# Patient Record
Sex: Female | Born: 1950
Health system: Southern US, Community
[De-identification: ages and names within clinical notes are randomized; demographics above are authoritative.]

## PROBLEM LIST (undated history)

## (undated) DIAGNOSIS — I10 Essential (primary) hypertension: Secondary | ICD-10-CM

## (undated) DIAGNOSIS — F329 Major depressive disorder, single episode, unspecified: Secondary | ICD-10-CM

## (undated) DIAGNOSIS — H269 Unspecified cataract: Secondary | ICD-10-CM

## (undated) DIAGNOSIS — I2510 Atherosclerotic heart disease of native coronary artery without angina pectoris: Secondary | ICD-10-CM

## (undated) DIAGNOSIS — M8580 Other specified disorders of bone density and structure, unspecified site: Secondary | ICD-10-CM

## (undated) DIAGNOSIS — Z8041 Family history of malignant neoplasm of ovary: Secondary | ICD-10-CM

## (undated) DIAGNOSIS — G4730 Sleep apnea, unspecified: Secondary | ICD-10-CM

## (undated) DIAGNOSIS — F32A Depression, unspecified: Secondary | ICD-10-CM

## (undated) DIAGNOSIS — E785 Hyperlipidemia, unspecified: Secondary | ICD-10-CM

## (undated) DIAGNOSIS — G4733 Obstructive sleep apnea (adult) (pediatric): Secondary | ICD-10-CM

## (undated) HISTORY — PX: COLONOSCOPY: SHX174

## (undated) HISTORY — DX: Sleep apnea, unspecified: G47.30

## (undated) HISTORY — DX: Obstructive sleep apnea (adult) (pediatric): G47.33

## (undated) HISTORY — DX: Family history of malignant neoplasm of ovary: Z80.41

## (undated) HISTORY — DX: Hyperlipidemia, unspecified: E78.5

## (undated) HISTORY — PX: DERMOID CYST  EXCISION: SHX1452

## (undated) HISTORY — PX: CATARACT EXTRACTION: SUR2

## (undated) HISTORY — DX: Unspecified cataract: H26.9

## (undated) HISTORY — DX: Atherosclerotic heart disease of native coronary artery without angina pectoris: I25.10

## (undated) HISTORY — DX: Other specified disorders of bone density and structure, unspecified site: M85.80

## (undated) HISTORY — DX: Depression, unspecified: F32.A

## (undated) HISTORY — DX: Essential (primary) hypertension: I10

## (undated) HISTORY — DX: Major depressive disorder, single episode, unspecified: F32.9

---

## 2000-07-09 ENCOUNTER — Encounter (INDEPENDENT_AMBULATORY_CARE_PROVIDER_SITE_OTHER): Payer: Self-pay | Admitting: Specialist

## 2000-07-09 ENCOUNTER — Ambulatory Visit (HOSPITAL_COMMUNITY): Admission: RE | Admit: 2000-07-09 | Discharge: 2000-07-09 | Payer: Self-pay | Admitting: Gynecology

## 2001-06-10 ENCOUNTER — Encounter: Payer: Self-pay | Admitting: Gynecology

## 2001-06-10 ENCOUNTER — Encounter: Admission: RE | Admit: 2001-06-10 | Discharge: 2001-06-10 | Payer: Self-pay | Admitting: Gynecology

## 2001-07-12 ENCOUNTER — Other Ambulatory Visit: Admission: RE | Admit: 2001-07-12 | Discharge: 2001-07-12 | Payer: Self-pay | Admitting: Gynecology

## 2002-06-13 ENCOUNTER — Encounter: Payer: Self-pay | Admitting: Gynecology

## 2002-06-13 ENCOUNTER — Encounter: Admission: RE | Admit: 2002-06-13 | Discharge: 2002-06-13 | Payer: Self-pay | Admitting: Gynecology

## 2002-06-30 ENCOUNTER — Other Ambulatory Visit: Admission: RE | Admit: 2002-06-30 | Discharge: 2002-06-30 | Payer: Self-pay | Admitting: Gynecology

## 2002-08-10 HISTORY — PX: ELBOW FRACTURE SURGERY: SHX616

## 2003-07-20 ENCOUNTER — Other Ambulatory Visit: Admission: RE | Admit: 2003-07-20 | Discharge: 2003-07-20 | Payer: Self-pay | Admitting: Gynecology

## 2003-07-23 ENCOUNTER — Encounter: Admission: RE | Admit: 2003-07-23 | Discharge: 2003-07-23 | Payer: Self-pay | Admitting: Gynecology

## 2004-07-23 ENCOUNTER — Other Ambulatory Visit: Admission: RE | Admit: 2004-07-23 | Discharge: 2004-07-23 | Payer: Self-pay | Admitting: Gynecology

## 2004-07-23 ENCOUNTER — Encounter: Admission: RE | Admit: 2004-07-23 | Discharge: 2004-07-23 | Payer: Self-pay | Admitting: Gynecology

## 2005-07-27 ENCOUNTER — Other Ambulatory Visit: Admission: RE | Admit: 2005-07-27 | Discharge: 2005-07-27 | Payer: Self-pay | Admitting: Gynecology

## 2005-08-11 ENCOUNTER — Encounter: Admission: RE | Admit: 2005-08-11 | Discharge: 2005-08-11 | Payer: Self-pay | Admitting: Gynecology

## 2006-03-10 ENCOUNTER — Ambulatory Visit: Payer: Self-pay | Admitting: Family Medicine

## 2006-03-25 ENCOUNTER — Ambulatory Visit: Payer: Self-pay | Admitting: Family Medicine

## 2006-07-28 ENCOUNTER — Other Ambulatory Visit: Admission: RE | Admit: 2006-07-28 | Discharge: 2006-07-28 | Payer: Self-pay | Admitting: Gynecology

## 2006-08-12 ENCOUNTER — Encounter: Admission: RE | Admit: 2006-08-12 | Discharge: 2006-08-12 | Payer: Self-pay | Admitting: Gynecology

## 2006-09-13 ENCOUNTER — Ambulatory Visit: Payer: Self-pay | Admitting: Family Medicine

## 2006-09-13 LAB — CONVERTED CEMR LAB
BUN: 13 mg/dL (ref 6–23)
Basophils Absolute: 0.1 10*3/uL (ref 0.0–0.1)
Basophils Relative: 1 % (ref 0.0–1.0)
CO2: 31 meq/L (ref 19–32)
Creatinine, Ser: 0.7 mg/dL (ref 0.4–1.2)
Direct LDL: 133.8 mg/dL
Eosinophils Absolute: 0.1 10*3/uL (ref 0.0–0.6)
MCHC: 34.6 g/dL (ref 30.0–36.0)
MCV: 89.3 fL (ref 78.0–100.0)
Monocytes Relative: 8.3 % (ref 3.0–11.0)
Neutro Abs: 2.7 10*3/uL (ref 1.4–7.7)
Neutrophils Relative %: 53.2 % (ref 43.0–77.0)
Platelets: 296 10*3/uL (ref 150–400)
RBC: 4.96 M/uL (ref 3.87–5.11)
Sodium: 142 meq/L (ref 135–145)

## 2006-10-11 ENCOUNTER — Ambulatory Visit: Payer: Self-pay | Admitting: Family Medicine

## 2007-04-18 ENCOUNTER — Telehealth (INDEPENDENT_AMBULATORY_CARE_PROVIDER_SITE_OTHER): Payer: Self-pay | Admitting: *Deleted

## 2007-05-02 ENCOUNTER — Ambulatory Visit: Payer: Self-pay | Admitting: Family Medicine

## 2007-05-02 DIAGNOSIS — G4700 Insomnia, unspecified: Secondary | ICD-10-CM | POA: Insufficient documentation

## 2007-05-02 DIAGNOSIS — F329 Major depressive disorder, single episode, unspecified: Secondary | ICD-10-CM

## 2007-05-02 DIAGNOSIS — H409 Unspecified glaucoma: Secondary | ICD-10-CM | POA: Insufficient documentation

## 2007-05-02 DIAGNOSIS — F32A Depression, unspecified: Secondary | ICD-10-CM | POA: Insufficient documentation

## 2007-05-02 DIAGNOSIS — I10 Essential (primary) hypertension: Secondary | ICD-10-CM | POA: Insufficient documentation

## 2007-05-02 DIAGNOSIS — R609 Edema, unspecified: Secondary | ICD-10-CM | POA: Insufficient documentation

## 2007-05-03 ENCOUNTER — Telehealth (INDEPENDENT_AMBULATORY_CARE_PROVIDER_SITE_OTHER): Payer: Self-pay | Admitting: *Deleted

## 2007-05-03 ENCOUNTER — Encounter (INDEPENDENT_AMBULATORY_CARE_PROVIDER_SITE_OTHER): Payer: Self-pay | Admitting: Family Medicine

## 2007-05-03 LAB — CONVERTED CEMR LAB
Calcium: 9.3 mg/dL (ref 8.4–10.5)
Chloride: 108 meq/L (ref 96–112)
Creatinine, Ser: 0.9 mg/dL (ref 0.4–1.2)
GFR calc Af Amer: 84 mL/min
GFR calc non Af Amer: 69 mL/min
Potassium: 4.3 meq/L (ref 3.5–5.1)

## 2007-05-04 ENCOUNTER — Telehealth (INDEPENDENT_AMBULATORY_CARE_PROVIDER_SITE_OTHER): Payer: Self-pay | Admitting: *Deleted

## 2007-06-30 ENCOUNTER — Ambulatory Visit: Payer: Self-pay | Admitting: Family Medicine

## 2007-08-16 ENCOUNTER — Encounter: Admission: RE | Admit: 2007-08-16 | Discharge: 2007-08-16 | Payer: Self-pay | Admitting: Family Medicine

## 2007-08-26 ENCOUNTER — Telehealth (INDEPENDENT_AMBULATORY_CARE_PROVIDER_SITE_OTHER): Payer: Self-pay | Admitting: *Deleted

## 2007-09-26 ENCOUNTER — Ambulatory Visit: Payer: Self-pay | Admitting: Family Medicine

## 2007-09-27 ENCOUNTER — Encounter (INDEPENDENT_AMBULATORY_CARE_PROVIDER_SITE_OTHER): Payer: Self-pay | Admitting: *Deleted

## 2007-09-27 LAB — CONVERTED CEMR LAB
BUN: 13 mg/dL (ref 6–23)
CO2: 28 meq/L (ref 19–32)
Calcium: 9.7 mg/dL (ref 8.4–10.5)
Chloride: 105 meq/L (ref 96–112)
Creatinine, Ser: 1.1 mg/dL (ref 0.4–1.2)
Direct LDL: 158.2 mg/dL
GFR calc Af Amer: 66 mL/min
GFR calc non Af Amer: 55 mL/min
Potassium: 4.4 meq/L (ref 3.5–5.1)
Sodium: 141 meq/L (ref 135–145)

## 2007-10-13 ENCOUNTER — Ambulatory Visit: Payer: Self-pay | Admitting: Family Medicine

## 2007-10-13 ENCOUNTER — Encounter (INDEPENDENT_AMBULATORY_CARE_PROVIDER_SITE_OTHER): Payer: Self-pay | Admitting: *Deleted

## 2008-05-23 ENCOUNTER — Ambulatory Visit: Payer: Self-pay | Admitting: Family Medicine

## 2008-05-23 ENCOUNTER — Ambulatory Visit: Payer: Self-pay | Admitting: Internal Medicine

## 2008-05-23 DIAGNOSIS — E785 Hyperlipidemia, unspecified: Secondary | ICD-10-CM | POA: Insufficient documentation

## 2008-05-23 DIAGNOSIS — M25569 Pain in unspecified knee: Secondary | ICD-10-CM | POA: Insufficient documentation

## 2008-05-24 ENCOUNTER — Telehealth (INDEPENDENT_AMBULATORY_CARE_PROVIDER_SITE_OTHER): Payer: Self-pay | Admitting: *Deleted

## 2008-05-24 LAB — CONVERTED CEMR LAB
ALT: 19 units/L (ref 0–35)
AST: 20 units/L (ref 0–37)
Alkaline Phosphatase: 43 units/L (ref 39–117)
Bilirubin, Direct: 0.2 mg/dL (ref 0.0–0.3)
CO2: 27 meq/L (ref 19–32)
Calcium: 9.3 mg/dL (ref 8.4–10.5)
Cholesterol: 235 mg/dL (ref 0–200)
Eosinophils Relative: 2.4 % (ref 0.0–5.0)
Glucose, Bld: 83 mg/dL (ref 70–99)
HDL: 58.9 mg/dL (ref >39.0)
Hemoglobin: 14 g/dL (ref 12.0–15.0)
Monocytes Absolute: 0.3 10*3/uL (ref 0.1–1.0)
Monocytes Relative: 7.9 % (ref 3.0–12.0)
Neutro Abs: 2.1 10*3/uL (ref 1.4–7.7)
Platelets: 234 10*3/uL (ref 150–400)
Potassium: 4.5 meq/L (ref 3.5–5.1)
RDW: 12.1 % (ref 11.5–14.6)
Total CHOL/HDL Ratio: 4
Triglycerides: 145 mg/dL (ref 0–149)
WBC: 4 10*3/uL — ABNORMAL LOW (ref 4.5–10.5)

## 2008-06-13 ENCOUNTER — Ambulatory Visit: Payer: Self-pay | Admitting: Family Medicine

## 2008-06-13 DIAGNOSIS — E669 Obesity, unspecified: Secondary | ICD-10-CM | POA: Insufficient documentation

## 2008-06-15 ENCOUNTER — Encounter (INDEPENDENT_AMBULATORY_CARE_PROVIDER_SITE_OTHER): Payer: Self-pay | Admitting: *Deleted

## 2008-06-15 ENCOUNTER — Telehealth (INDEPENDENT_AMBULATORY_CARE_PROVIDER_SITE_OTHER): Payer: Self-pay | Admitting: *Deleted

## 2008-06-15 LAB — CONVERTED CEMR LAB
BUN: 16 mg/dL (ref 6–23)
Chloride: 106 meq/L (ref 96–112)
GFR calc Af Amer: 96 mL/min
Potassium: 4.6 meq/L (ref 3.5–5.1)

## 2008-06-18 ENCOUNTER — Telehealth (INDEPENDENT_AMBULATORY_CARE_PROVIDER_SITE_OTHER): Payer: Self-pay | Admitting: *Deleted

## 2008-08-16 ENCOUNTER — Encounter: Admission: RE | Admit: 2008-08-16 | Discharge: 2008-08-16 | Payer: Self-pay | Admitting: Family Medicine

## 2008-08-17 ENCOUNTER — Encounter (INDEPENDENT_AMBULATORY_CARE_PROVIDER_SITE_OTHER): Payer: Self-pay | Admitting: *Deleted

## 2008-09-12 ENCOUNTER — Encounter: Payer: Self-pay | Admitting: Family Medicine

## 2008-11-06 ENCOUNTER — Ambulatory Visit: Payer: Self-pay | Admitting: Family Medicine

## 2008-11-12 ENCOUNTER — Telehealth (INDEPENDENT_AMBULATORY_CARE_PROVIDER_SITE_OTHER): Payer: Self-pay | Admitting: *Deleted

## 2008-11-12 LAB — CONVERTED CEMR LAB
AST: 24 units/L (ref 0–37)
Albumin: 3.8 g/dL (ref 3.5–5.2)
Alkaline Phosphatase: 55 units/L (ref 39–117)
Bilirubin, Direct: 0 mg/dL (ref 0.0–0.3)
Calcium: 9.4 mg/dL (ref 8.4–10.5)
Cholesterol: 263 mg/dL — ABNORMAL HIGH (ref 0–200)
Creatinine, Ser: 0.9 mg/dL (ref 0.4–1.2)
Direct LDL: 170.5 mg/dL
GFR calc non Af Amer: 68.76 mL/min (ref >60)
Glucose, Bld: 105 mg/dL — ABNORMAL HIGH (ref 70–99)
Total Bilirubin: 1 mg/dL (ref 0.3–1.2)
Total CHOL/HDL Ratio: 4
Total Protein: 6.7 g/dL (ref 6.0–8.3)
VLDL: 35 mg/dL (ref 0.0–40.0)

## 2009-01-14 ENCOUNTER — Ambulatory Visit: Payer: Self-pay | Admitting: Family Medicine

## 2009-01-14 LAB — CONVERTED CEMR LAB
ALT: 29 units/L (ref 0–35)
AST: 33 units/L (ref 0–37)
Albumin: 3.7 g/dL (ref 3.5–5.2)
Bilirubin, Direct: 0.1 mg/dL (ref 0.0–0.3)

## 2009-01-22 ENCOUNTER — Telehealth: Payer: Self-pay | Admitting: Family Medicine

## 2009-02-06 ENCOUNTER — Encounter: Payer: Self-pay | Admitting: Family Medicine

## 2009-02-06 ENCOUNTER — Other Ambulatory Visit: Admission: RE | Admit: 2009-02-06 | Discharge: 2009-02-06 | Payer: Self-pay | Admitting: Family Medicine

## 2009-02-06 ENCOUNTER — Ambulatory Visit: Payer: Self-pay | Admitting: Family Medicine

## 2009-02-07 ENCOUNTER — Telehealth (INDEPENDENT_AMBULATORY_CARE_PROVIDER_SITE_OTHER): Payer: Self-pay | Admitting: *Deleted

## 2009-02-07 LAB — CONVERTED CEMR LAB
AST: 35 units/L (ref 0–37)
Bilirubin, Direct: 0.2 mg/dL (ref 0.0–0.3)
Total Bilirubin: 1.2 mg/dL (ref 0.3–1.2)
Total CHOL/HDL Ratio: 3
Total Protein: 6.7 g/dL (ref 6.0–8.3)

## 2009-02-13 ENCOUNTER — Encounter (INDEPENDENT_AMBULATORY_CARE_PROVIDER_SITE_OTHER): Payer: Self-pay | Admitting: *Deleted

## 2009-02-22 ENCOUNTER — Telehealth (INDEPENDENT_AMBULATORY_CARE_PROVIDER_SITE_OTHER): Payer: Self-pay | Admitting: *Deleted

## 2009-02-25 ENCOUNTER — Telehealth (INDEPENDENT_AMBULATORY_CARE_PROVIDER_SITE_OTHER): Payer: Self-pay | Admitting: *Deleted

## 2009-02-27 ENCOUNTER — Ambulatory Visit: Payer: Self-pay | Admitting: Family Medicine

## 2009-02-27 DIAGNOSIS — L255 Unspecified contact dermatitis due to plants, except food: Secondary | ICD-10-CM | POA: Insufficient documentation

## 2009-06-06 ENCOUNTER — Ambulatory Visit: Payer: Self-pay | Admitting: Gastroenterology

## 2009-07-12 ENCOUNTER — Ambulatory Visit: Payer: Self-pay | Admitting: Gastroenterology

## 2009-08-19 ENCOUNTER — Telehealth (INDEPENDENT_AMBULATORY_CARE_PROVIDER_SITE_OTHER): Payer: Self-pay | Admitting: *Deleted

## 2009-08-26 ENCOUNTER — Encounter: Admission: RE | Admit: 2009-08-26 | Discharge: 2009-08-26 | Payer: Self-pay | Admitting: Family Medicine

## 2009-09-16 ENCOUNTER — Ambulatory Visit: Payer: Self-pay | Admitting: Family Medicine

## 2009-09-16 ENCOUNTER — Ambulatory Visit: Payer: Self-pay | Admitting: Diagnostic Radiology

## 2009-09-16 ENCOUNTER — Emergency Department (HOSPITAL_COMMUNITY): Admission: EM | Admit: 2009-09-16 | Discharge: 2009-09-16 | Payer: Self-pay | Admitting: Emergency Medicine

## 2009-09-16 ENCOUNTER — Encounter: Payer: Self-pay | Admitting: Emergency Medicine

## 2009-09-19 ENCOUNTER — Telehealth (INDEPENDENT_AMBULATORY_CARE_PROVIDER_SITE_OTHER): Payer: Self-pay | Admitting: *Deleted

## 2009-09-19 LAB — CONVERTED CEMR LAB
Cholesterol: 231 mg/dL — ABNORMAL HIGH (ref 0–200)
Direct LDL: 142.1 mg/dL

## 2010-03-17 ENCOUNTER — Telehealth (INDEPENDENT_AMBULATORY_CARE_PROVIDER_SITE_OTHER): Payer: Self-pay | Admitting: *Deleted

## 2010-03-26 ENCOUNTER — Ambulatory Visit: Payer: Self-pay | Admitting: Family Medicine

## 2010-03-26 ENCOUNTER — Other Ambulatory Visit: Admission: RE | Admit: 2010-03-26 | Discharge: 2010-03-26 | Payer: Self-pay | Admitting: Family Medicine

## 2010-03-26 DIAGNOSIS — M79609 Pain in unspecified limb: Secondary | ICD-10-CM | POA: Insufficient documentation

## 2010-03-27 LAB — CONVERTED CEMR LAB
ALT: 26 units/L (ref 0–35)
AST: 28 units/L (ref 0–37)
Albumin: 4.1 g/dL (ref 3.5–5.2)
Basophils Absolute: 0 10*3/uL (ref 0.0–0.1)
CO2: 28 meq/L (ref 19–32)
Direct LDL: 120.8 mg/dL
Eosinophils Relative: 1.8 % (ref 0.0–5.0)
GFR calc non Af Amer: 92.98 mL/min (ref >60)
HDL: 69.8 mg/dL (ref >39.00)
Hemoglobin: 13 g/dL (ref 12.0–15.0)
Lymphocytes Relative: 33.5 % (ref 12.0–46.0)
MCHC: 33.4 g/dL (ref 30.0–36.0)
Monocytes Absolute: 0.4 10*3/uL (ref 0.1–1.0)
Neutrophils Relative %: 55.6 % (ref 43.0–77.0)
Platelets: 255 10*3/uL (ref 150.0–400.0)
Potassium: 4.4 meq/L (ref 3.5–5.1)
Sodium: 142 meq/L (ref 135–145)
Total Bilirubin: 0.6 mg/dL (ref 0.3–1.2)
Total CHOL/HDL Ratio: 3
Total Protein: 6.7 g/dL (ref 6.0–8.3)
Triglycerides: 185 mg/dL — ABNORMAL HIGH (ref 0.0–149.0)
VLDL: 37 mg/dL (ref 0.0–40.0)
WBC: 4.4 10*3/uL — ABNORMAL LOW (ref 4.5–10.5)

## 2010-03-31 ENCOUNTER — Encounter (INDEPENDENT_AMBULATORY_CARE_PROVIDER_SITE_OTHER): Payer: Self-pay | Admitting: *Deleted

## 2010-03-31 LAB — CONVERTED CEMR LAB: Pap Smear: NEGATIVE

## 2010-05-15 ENCOUNTER — Telehealth: Payer: Self-pay | Admitting: Family Medicine

## 2010-05-15 ENCOUNTER — Ambulatory Visit: Payer: Self-pay | Admitting: Vascular Surgery

## 2010-05-16 ENCOUNTER — Telehealth: Payer: Self-pay | Admitting: Family Medicine

## 2010-08-12 ENCOUNTER — Telehealth: Payer: Self-pay | Admitting: Family Medicine

## 2010-09-08 ENCOUNTER — Encounter
Admission: RE | Admit: 2010-09-08 | Discharge: 2010-09-08 | Payer: Self-pay | Source: Home / Self Care | Attending: Family Medicine | Admitting: Family Medicine

## 2010-09-08 LAB — HM MAMMOGRAPHY

## 2010-09-09 NOTE — Progress Notes (Signed)
Summary: Furosemide refill  Phone Note Refill Request Message from:  Patient  Refills Requested: Medication #1:  FUROSEMIDE 20 MG TABS Take 1 tab by mouth as needed for swelling MD cannot see patient today so prescription will be sent to pharmacy for pick up.  Initial call taken by: Lucious Groves CMA,  May 16, 2010 8:29 AM    Prescriptions: FUROSEMIDE 20 MG TABS (FUROSEMIDE) Take 1 tab by mouth as needed for swelling  #30 Tablet x 3   Entered by:   Lucious Groves CMA   Authorized by:   Neena Rhymes MD   Signed by:   Lucious Groves CMA on 05/16/2010   Method used:   Electronically to        [facility]  [address] [address] [phone][facility] (retail)       [address]       [place], [place]  [address]       Ph: [phone] or [phone]       Fax: [phone]   RxID:   [id number]

## 2010-09-09 NOTE — Progress Notes (Signed)
Summary: simvastatin refill   Phone Note Refill Request Message from:  Fax from Pharmacy on March 17, 2010 8:58 AM  Refills Requested: Medication #1:  SIMVASTATIN 20 MG TABS take one tablet at bedtime [facility] - fax [phone] - - note from pharmacy "request for auth to dispense 90 day supply"  Initial call taken by: Okey Regal [place],  March 17, 2010 9:01 AM    Prescriptions: SIMVASTATIN 20 MG TABS (SIMVASTATIN) take one tablet at bedtime  #30 Tablet x 0   Entered by:   Doristine Devoid CMA   Authorized by:   Neena Rhymes MD   Signed by:   Doristine Devoid CMA on 03/17/2010   Method used:   [facility]  [address] [phone][facility] ([facility]ail[facility]       [address]       [place], [place]  [address]       Ph: [phone] or [phone]       Fax: [phone]   RxID:   [id number]

## 2010-09-09 NOTE — Letter (Signed)
Summary: Results Follow up Letter  [facility]  [address] [place], [place] [address]   Phone: [phone]  Fax: [phone]    03/31/2010 MRN: [id number]  Alexandra [name] [address] [place], [place]  [address]  Dear [name],  The following are the results of your recent test(s):  Test         Result    Pap Smear:        Normal __X___  Not Normal _____ Comments: REPEAT 1 YR. ______________________________________________________ Cholesterol: LDL(Bad cholesterol):         Your goal is less than:         HDL (Good cholesterol):       Your goal is more than: Comments:  ______________________________________________________ Mammogram:        Normal _____  Not Normal _____ Comments:  ___________________________________________________________________ Hemoccult:        Normal _____  Not normal _______ Comments:    _____________________________________________________________________ Other Tests:    We routinely do not discuss normal results over the telephone.  If you desire a copy of the results, or you have any questions about this information we can discuss them at your next office visit.   Sincerely,

## 2010-09-09 NOTE — Progress Notes (Signed)
Summary: refill  Phone Note Refill Request Message from:  Fax from Pharmacy on [address] [address] fax [phone]  Refills Requested: Medication #1:  LEXAPRO 20 MG TABS Initial call taken by: Barb Merino,  August 19, 2009 4:37 PM    Prescriptions: LEXAPRO 20 MG TABS (ESCITALOPRAM OXALATE)   #90 x 0   Entered by:   Shary Decamp   Authorized by:   Neena Rhymes MD   Signed by:   Shary Decamp on 08/19/2009   Method used:   [facility]ed to ...       [facility]  [address] [phone][facility] (retail[facility]       [address]       [place], [place]  [address]       Ph: [phone] or [phone]       Fax: [phone]   RxID:   [id number]

## 2010-09-09 NOTE — Assessment & Plan Note (Signed)
Summary: cpx/fasting//kn   Vital Signs:  Patient profile:   60 year old female Height:      63.25 inches Weight:      188 pounds BMI:     33.16 Pulse rate:   80 / minute BP sitting:   130 / 80  (left arm)  Vitals Entered By: Doristine Devoid CMA (March 26, 2010 10:16 AM) CC: CPX AND LABS W/ PAP   History of Present Illness: 60 yo woman here today for CPE.  UTD on mammogram.  Depression- weaned off lexapro, doing well w/out meds.  edema- 'not happy with the medication', 'i'm so tired of swelling'.  still has swelling after a night of elevation.  having swelling daily.    L thumb pain- pt reports she fell and injured thumb 'years ago' but was told that it wasn't broken.  L thumb MCP joint swollen, unable to extend thumb.  will still have pain but is more bothered by limited movement.  Preventive Screening-Counseling & Management  Alcohol-Tobacco     Alcohol drinks/day: <1     Smoking Status: never  Caffeine-Diet-Exercise     Does Patient Exercise: yes     Type of exercise: walking      Sexual History:  currently monogamous.        Drug Use:  never.    Current Medications (verified): 1)  Lexapro 20 Mg Tabs (Escitalopram Oxalate) .... Take One Tablet Daily 2)  Lumigan 0.03 % Soln (Bimatoprost) .... Use Daily 3)  Lisinopril-Hydrochlorothiazide 10-12.5 Mg Tabs (Lisinopril-Hydrochlorothiazide) .Marland Kitchen.. 1 Tablet By Mouth Once A Day 4)  Ambien Cr 12.5 Mg  Tbcr (Zolpidem Tartrate) .... Take One Tablet Bedtime 5)  Furosemide 20 Mg Tabs (Furosemide) .... Take 1 Tab By Mouth As Needed For Swelling 6)  Voltaren 75 Mg Tbec (Diclofenac Sodium) .Marland Kitchen.. 1 Tab By Mouth Two Times A Day X 14 Days and Then As Needed For Pain 7)  Simvastatin 20 Mg Tabs (Simvastatin) .... Take One Tablet At Bedtime  Allergies (verified): No Known Drug Allergies  Past History:  Past Medical History: Last updated: 03-03-09 Depression Hypertension Hyperlipidemia Glaucoma  Past Surgical History: Last  updated: 05/02/2007 Dermoid Cyst  Family History: Last updated: Mar 03, 2009 Father MI: deceased at age 27 no family hx of breast CA sister died of lung cancer- age 61 brother in remission from throat cancer no colon or breast CA  Social History: Last updated: 05/02/2007 Occupation: Surveyor, mining Married with children Never Smoked Alcohol use-yes  Review of Systems       The patient complains of peripheral edema.  The patient denies anorexia, fever, weight loss, weight gain, vision loss, decreased hearing, hoarseness, chest pain, syncope, dyspnea on exertion, prolonged cough, headaches, abdominal pain, melena, hematochezia, severe indigestion/heartburn, hematuria, suspicious skin lesions, depression, abnormal bleeding, enlarged lymph nodes, and breast masses.    Physical Exam  General:  Well-developed,well-nourished,in no acute distress; alert,appropriate and cooperative throughout examination Head:  Normocephalic and atraumatic without obvious abnormalities. No apparent alopecia or balding. Eyes:  No corneal or conjunctival inflammation noted. EOMI. Perrla. Funduscopic exam benign, without hemorrhages, exudates or papilledema. Vision grossly normal. Ears:  External ear exam shows no significant lesions or deformities.  Otoscopic examination reveals clear canals, tympanic membranes are intact bilaterally without bulging, retraction, inflammation or discharge. Hearing is grossly normal bilaterally. Nose:  External nasal examination shows no deformity or inflammation. Nasal mucosa are pink and moist without lesions or exudates. Mouth:  Oral mucosa and oropharynx without lesions or exudates.  Teeth  in good repair. Neck:  No deformities, masses, or tenderness noted. Breasts:  No mass, nodules, thickening, tenderness, bulging, retraction, inflamation, nipple discharge or skin changes noted.   Lungs:  Normal respiratory effort, chest expands symmetrically. Lungs are clear to auscultation,  no crackles or wheezes. Heart:  Normal rate and regular rhythm. S1 and S2 normal without gallop, murmur, click, rub or other extra sounds. Abdomen:  soft, NT/ND, +BS Genitalia:  Pelvic Exam:        External: normal female genitalia without lesions or masses        Vagina: normal without lesions or masses        Cervix: normal without lesions or masses        Adnexa: normal bimanual exam without masses or fullness        Uterus: normal by palpation        Pap smear: performed Msk:  deformity of L MCP joint, inability to extend thumb remainder of exam normal Pulses:  +2 carotid, radial, DP pulses Extremities:  +1 edema of LEs bilaterally Neurologic:  No cranial nerve deficits noted. Station and gait are normal. Plantar reflexes are down-going bilaterally. DTRs are symmetrical throughout. Sensory, motor and coordinative functions appear intact. Skin:  Intact without suspicious lesions or rashes Cervical Nodes:  No lymphadenopathy noted Axillary Nodes:  No palpable lymphadenopathy Psych:  Cognition and judgment appear intact. Alert and cooperative with normal attention span and concentration. No apparent delusions, illusions, hallucinations   Impression & Recommendations:  Problem # 1:  ROUTINE GYNECOLOGICAL EXAMINATION (ICD-V72.31) Assessment New pt's PE WNL w/ exception of L thumb and LE edema (see below).  UTD on mammogram.  labs collected, anticipatory guidance provided.  Problem # 2:  SCREENING FOR MALIGNANT NEOPLASM OF THE CERVIX (ICD-V76.2) Assessment: New pap collected.  Problem # 3:  ANKLE EDEMA, CHRONIC (ICD-782.3) Assessment: Unchanged  pt unhappy w/ results of lasix.  not exercising regularly.  reviewed supportive care- exercise, low Na diet, support hose.  will refer to vascular for complete eval- pt in agreement Her updated medication list for this problem includes:    Lisinopril-hydrochlorothiazide 10-12.5 Mg Tabs (Lisinopril-hydrochlorothiazide) .Marland Kitchen... 1 tablet by mouth  once a day    Furosemide 20 Mg Tabs (Furosemide) .Marland Kitchen... Take 1 tab by mouth as needed for swelling  Orders: [facility] Clinic (Vascular)  Problem # 4:  THUMB PAIN, LEFT (ICD-729.5) Assessment: New  pt's L thumb swollen w/ deformity of MCP joint.  will refer to hand specialist for evaluation and tx.  Orders: Orthopedic Referral (Ortho)  Problem # 5:  HYPERLIPIDEMIA (ICD-272.4) Assessment: Unchanged due for labs. Her updated medication list for this problem includes:    Simvastatin 20 Mg Tabs (Simvastatin) .Marland Kitchen... Take one tablet at bedtime  Orders: Venipuncture ([address]) TLB-Lipid Panel (80061-LIPID) TLB-Hepatic/Liver Function Pnl (80076-HEPATIC)  Problem # 6:  HYPERTENSION (ICD-401.9) Assessment: Unchanged adequate control.  check labs. Her updated medication list for this problem includes:    Lisinopril-hydrochlorothiazide 10-12.5 Mg Tabs (Lisinopril-hydrochlorothiazide) .Marland Kitchen... 1 tablet by mouth once a day    Furosemide 20 Mg Tabs (Furosemide) .Marland Kitchen... Take 1 tab by mouth as needed for swelling  Orders: TLB-BMP (Basic Metabolic Panel-BMET) (80048-METABOL) TLB-CBC Platelet - w/Differential (85025-CBCD) TLB-TSH (Thyroid Stimulating Hormone) (84443-TSH) Specimen Handling ([address])  Complete Medication List: 1)  Lexapro 20 Mg Tabs (Escitalopram oxalate) .... Take one tablet daily 2)  Lumigan 0.03 % Soln (Bimatoprost) .... Use daily 3)  Lisinopril-hydrochlorothiazide 10-12.5 Mg Tabs (Lisinopril-hydrochlorothiazide) .Marland Kitchen.. 1 tablet by mouth once a day 4)  Ambien  Cr 12.5 Mg Tbcr (Zolpidem tartrate) .... Take one tablet bedtime 5)  Furosemide 20 Mg Tabs (Furosemide) .... Take 1 tab by mouth as needed for swelling 6)  Voltaren 75 Mg Tbec (Diclofenac sodium) .Marland Kitchen.. 1 tab by mouth two times a day x 14 days and then as needed for pain 7)  Simvastatin 20 Mg Tabs (Simvastatin) .... Take one tablet at bedtime  Patient Instructions: 1)  Follow up in 6 months- sooner if needed 2)  Someone will call  you with your hand and vascular appts 3)  We'll notify you of your lab results 4)  Try and get regular exercise- this will help the swelling 5)  Call with any questions or concerns 6)  Have a great fall season! Prescriptions: AMBIEN CR 12.5 MG  TBCR (ZOLPIDEM TARTRATE) take one tablet bedtime  #30 x 1   Entered and Authorized by:   Neena Rhymes MD   Signed by:   Neena Rhymes MD on 03/26/2010   Method used:   Print then Give to Patient   RxID:   [id number] SIMVASTATIN 20 MG TABS (SIMVASTATIN) take one tablet at bedtime  #90 x 3   Entered and Authorized by:   Neena Rhymes MD   Signed by:   Neena Rhymes MD on 03/26/2010   Method used:   Electronically to        [facility]* (mail-order)       [address] [place], [place]  [address]       Ph: [phone]       Fax: [phone]   RxID:   [id number] LISINOPRIL-HYDROCHLOROTHIAZIDE 10-12.5 MG TABS (LISINOPRIL-HYDROCHLOROTHIAZIDE) 1 tablet by mouth once a day  #90 x 3   Entered and Authorized by:   Neena Rhymes MD   Signed by:   Neena Rhymes MD on 03/26/2010   Method used:   Electronically to        [facility]* (mail-order)       [address] [place], [place]  [address]       Ph: [phone]       Fax: [phone]   RxID:   [id number]

## 2010-09-09 NOTE — Progress Notes (Signed)
Summary: specialist/rx refill  Phone Note Call from Patient Call back at [phone]   Summary of Call: Patient left message on triage that she saw the vein specialist today and they cannot find a reason for her swelling in her legs. She notes that not only did they not know the cause, but they also didn't give her anything to help her get some relief from the swelling. Patient would like to know what Dr. Beverely Low recommends and will contact the pharmacy about a refill. Patient was last seen 03/2010. Is it ok to refilll the Furosemide? Please advise.  Initial call taken by: Lucious Groves CMA,  May 15, 2010 3:56 PM  Follow-up for Phone Call        Patient called back to note that she is very unhappy with the specialist today. Her legs are swollen badly and the specialist tried to convince her that there was nothing wrong/is not uncommon. Pt notes that it occurs daily. Patient is on the verge of crying , she is requesting a call from Dr. Beverely Low. Lucious Groves CMA  May 15, 2010 4:04 PM   Additional Follow-up for Phone Call Additional follow up Details #1::        unable to call pt's right now b/c i'm seeing pt's.  ok to refill lasix- 1 tab daily as needed for swelling, take 2nd tab prn for severe or continued swelling.  #30, 3 refills. Additional Follow-up by: Neena Rhymes MD,  May 15, 2010 4:10 PM    Additional Follow-up for Phone Call Additional follow up Details #2::    Patient notified and she burst into tears. She wants to discuss and will come and see Tabori tomorrow. Lucious Groves CMA  May 15, 2010 4:47 PM

## 2010-09-09 NOTE — Progress Notes (Signed)
Summary: discuss labs  Phone Note Outgoing Call   Call placed by: Doristine Devoid,  September 19, 2009 11:36 AM Call placed to: Patient Summary of Call: Pt's cholesterol is not nearly as good as it was 6 months ago.  total cholesterol has increased from 154 --> 231.  LDL has increased from 70 --> 142.  Please ask if she is still taking simvastatin.  If so, needs to increase dose to 40 mg nightly.  if not taking, should resume daily and recheck LFTs in 6 weeks.  Follow-up for Phone Call        left message on machine .Marland KitchenDoristine Devoid  September 19, 2009 11:37 AM   spoke w/ patient says she hadn't been taken cholesterol meds but says she will restart and return for lft's in 6 weeks .Marland KitchenMarland KitchenDoristine Devoid  September 23, 2009 3:43 PM

## 2010-09-10 ENCOUNTER — Other Ambulatory Visit: Payer: Self-pay | Admitting: Family Medicine

## 2010-09-10 DIAGNOSIS — R928 Other abnormal and inconclusive findings on diagnostic imaging of breast: Secondary | ICD-10-CM

## 2010-09-11 NOTE — Progress Notes (Signed)
Summary:  call-a-nurse  Phone Note Outgoing Call   Call placed by: Jeremy Johann CMA,  August 12, 2010 8:40 AM Details for Reason: [facility] Triage Call Report Triage Record Num: [id number] Operator: Valene Bors Patient Name: Alexandra [name] Call Date & Time: 08/11/2010 8:53:13AM Patient Phone: [phone] PCP: Neena Rhymes ([facility]) Patient Gender: Female PCP Fax : Patient DOB: 08-15-1950 Practice Name: [facility] Reason for Call: [name] is calling about starting with a Sore Throat and congestion on 08/04/10. She has a frequent loose cough and sinus congestion and is coughing up greenish yellowish mucos. She is afebrile. Occasional headaches. She is taking a OTC Chest Congestion DM and she has been taking Acetaminophen q 4 hr. Advised plain Robitussin or Musinex for daytime. Care advice per URI Protocol and advised to be checked within 24 hrs. She may go to UC today -08/11/10. Advised to call office on 08/12/10 if UC not open. Protocol(s) Used: Upper Respiratory Infection (URI) Recommended Outcome per Protocol: See Provider within 24 hours Reason for Outcome: Productive cough with colored sputum (other than clear or white sputum) Care Advice:  ~ Use a cool mist humidifier to moisten air. Be sure to clean according to manufacturer's instructions.  ~ May inhale steam from hot shower or heated water. Be careful to avoid burns. Limit or avoid exposure to irritants and allergens (e.g. air pollution, smoke/smoking, chemicals, dust, pollen, pet dander, etc.)  ~ Increase fluids to 8-12 eight oz (1.6 to 2.4 liters) glasses per day, half of them to be water. Soups, popsicles, fruit juices, non-caffeinated sodas (unless restricting sodium intake), jello, broths, decaf teas, etc. are all okay. Warm fluids can be soothing.  ~  ~ If you can, stop smoking now and avoid all secondhand smoke.  ~ Warm fluids may help, or try a mixture of honey and lemon juice in warm tea.  ~  HEALTH PROMOTION / MAINTENANCE  ~ SYMPTOM / CONDITION MANAGEMENT  ~ CAUTIONS Coughing up mucus or phlegm helps to get rid of an infection. A productive cough should not be stopped. A cough medicine wit Summary of Call: Left message to call office........................Marland KitchenFelecia Deloach CMA  August 12, 2010 8:40 AM   left message to call office.....................Marland KitchenFelecia Deloach CMA  August 13, 2010 8:28 AM   Pt states that she did go to UC and was Rx a Zpak. Pt feels much better today however she drop her last pill of pak so she is unable to take last dose of Rx. Pt advise to inform UC to see what they would like her to do ........Marland KitchenFelecia Deloach CMA  August 15, 2010 9:34 AM   Follow-up for Phone Call        missing the last pill won't cause her problems.  no need to pay for additional meds. Follow-up by: Neena Rhymes MD,  August 15, 2010 10:03 AM  Additional Follow-up for Phone Call Additional follow up Details #1::        Discuss with patient ...........Marland KitchenFelecia Deloach CMA  August 15, 2010 11:57 AM

## 2010-09-12 ENCOUNTER — Ambulatory Visit
Admission: RE | Admit: 2010-09-12 | Discharge: 2010-09-12 | Disposition: A | Discharge status: Home / Self Care | Payer: BC Managed Care – PPO | Source: Ambulatory Visit | Attending: Family Medicine | Admitting: Family Medicine

## 2010-09-12 ENCOUNTER — Encounter: Payer: Self-pay | Admitting: Family Medicine

## 2010-09-12 DIAGNOSIS — R928 Other abnormal and inconclusive findings on diagnostic imaging of breast: Secondary | ICD-10-CM

## 2010-09-25 NOTE — Letter (Signed)
Summary: [facility]  [facility]   Imported By: Kassie Mends 09/18/2010 10:44:48  _____________________________________________________________________  External Attachment:    Type:   Image     Comment:   External Document

## 2010-10-30 LAB — CBC
HCT: 39.5 % (ref 36.0–46.0)
MCHC: 34 g/dL (ref 30.0–36.0)
Platelets: 315 10*3/uL (ref 150–400)
RDW: 12.6 % (ref 11.5–15.5)
WBC: 6.7 10*3/uL (ref 4.0–10.5)

## 2010-10-30 LAB — BASIC METABOLIC PANEL
BUN: 22 mg/dL (ref 6–23)
CO2: 29 mEq/L (ref 19–32)
Calcium: 8.9 mg/dL (ref 8.4–10.5)
Creatinine, Ser: 0.9 mg/dL (ref 0.4–1.2)
Sodium: 142 mEq/L (ref 135–145)

## 2010-10-30 LAB — DIFFERENTIAL
Eosinophils Relative: 2 % (ref 0–5)
Monocytes Relative: 7 % (ref 3–12)
Neutrophils Relative %: 53 % (ref 43–77)

## 2010-12-23 NOTE — Assessment & Plan Note (Signed)
OFFICE VISIT   [name], [name]  DOB:  [birth year]                                       05/15/2010  CHART#:[id number]   CHIEF COMPLAINT:  Leg swelling.   HISTORY OF PRESENT ILLNESS:  The patient is a 60 year old female  referred by Dr. Beverely Low for bilateral lower extremity leg swelling.  The  patient states she has had swelling of her calves, ankles and feet for  greater than 10 years.  The left leg is slightly worse than the right.  She states the swelling does become worse if she drinks a few alcoholic  beverages.  She states that the swelling also becomes worse if she is  standing on her feet all day long.  The swelling does improve somewhat  with elevation.  She usually sits most of the time at work.  She denies  any trauma her lower extremities.  She has had 2 prior childbirths,  spontaneous vaginal deliveries, and an ORIF of her left elbow but  otherwise no lower extremity procedures performed.   CHRONIC MEDICAL PROBLEMS:  Hypertension, elevated cholesterol.  These  are both currently controlled and followed by her primary care  physician.   PAST MEDICAL HISTORY:  As listed above.   SOCIAL HISTORY:  She is married.  She drinks 4-5 glasses of wine per  week.  She does not smoke.   FAMILY HISTORY:  Unremarkable.   A full 12-point review of systems was performed with the patient today.  Please see intake referral form for details regarding this.   PHYSICAL EXAMINATION:  Blood pressure is 131/81 in the left arm, heart  rate 72 and regular.  Temperature is 97.9.  HEENT:  Unremarkable.  Neck  has 2+ carotid pulses without bruit.  Chest is clear to auscultation.  Cardiac exam is regular rate rhythm without murmur.  Abdomen is slightly  obese, soft, nontender, nondistended.  No masses.  Extremities:  She has  2+ femoral, dorsalis pedis and posterior tibial pulses bilaterally.  Musculoskeletal exam shows no major significant joint deformities.  Neurologic  exam:  She has symmetric upper extremity and lower extremity  motor strength that is 5/5.  She has no areas of decreased sensation.  Skin has no open ulcers or rashes.   She had a venous duplex exam performed today which showed no evidence of  DVT.  She did have mild incompetence of her left common femoral vein but  she had no superficial venous incompetence.  Right lower extremity was  completely normal.   I reviewed 7 pages of medical records referred from [facility] and  this showed a normal serum albumin, normal BUN and creatinine.  Slightly  elevated total cholesterol.   I had a lengthy discussion with the patient today that there is not a  vascular origin for her leg swelling.  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  A weight  loss program would probably benefit her leg swelling significantly.  She  may also have some component of lymphedema but I am less doubtful of  this.  I did prescribe for her today bilateral lower extremity  compression stockings for symptomatic relief.  All these treatment plans  and possible etiologies of her leg swelling were discussed with the  patient today.  She will return  for followup on an as-needed basis.     Alexandra Hora. Fields, MD  Electronically Signed   CEF/MEDQ  D:  05/15/2010  T:  05/16/2010  Job:  [id number]   cc:   Alexandra [name], M.D.

## 2010-12-23 NOTE — Procedures (Signed)
DUPLEX DEEP VENOUS EXAM - LOWER EXTREMITY   INDICATION:  Chronic edema.   HISTORY:  Edema:  Bilateral edema for years per the patient.  Trauma/Surgery:  No.  Pain:  No.  PE:  No.  Previous DVT:  No.  Anticoagulants:  Other:   DUPLEX EXAM:                CFV   SFV   PopV  PTV    GSV                R  L  R  L  R  L  R   L  R  L  Thrombosis    o  o  o  o  o  o  o   o  o  o  Spontaneous   +  +  +  +  +  +  +   +  +  +  Phasic        +  +  +  +  +  +  +   +  +  +  Augmentation  +  +  +  +  +  +  +   +  +  +  Compressible  +  +  +  +  +  +  +   +  +  +  Competent     +  o  +  +  +  +  +   +  +  +   Legend:  + - yes  o - no  p - partial  D - decreased   IMPRESSION:  1. No evidence of deep or superficial vein thrombosis noted in the      bilateral lower extremities.  2. Mild venous reflux is noted in the left common femoral vein.    _____________________________  Janetta Hora. Fields, MD   CH/MEDQ  D:  05/16/2010  T:  05/16/2010  Job:  [id number]

## 2010-12-26 NOTE — Op Note (Signed)
[facility]  Patient:    Alexandra [name], Alexandra [name]                         MRN: [id number] Proc. Date: 07/09/00 Adm. Date:  [id number] Disc. Date: [id number] Attending:  Douglass Rivers                           Operative Report  PREOPERATIVE DIAGNOSIS:       Complex adnexal mass.  POSTOPERATIVE DIAGNOSES:      1. Complex adnexal mass.                               2. Endometriosis.                               3. Pelvic adhesions.  PROCEDURES:                   Laparoscopic ovarian cystectomy x 2. Lysis of adhesions.  SURGEON:                      Douglass Rivers, M.D.  ASSISTANTAneta Mins ______.  ANESTHESIA:                   General.  BLOOD LOSS:                   Minimal.  FINDINGS:                     Endometriosis in the right adnexa and cul-de-sac, right endometrioma, right dermoid, left bowel adhesions to the left adnexa, status post salpingectomy.  COMPLICATIONS:                None.  PATHOLOGY:                    There was a dermoid cyst in the cyst wall from the endometrioma.  PROCEDURE IN DETAIL:          The patient was taken to the the operating room. General anesthesia was induced and patient was placed in the dorsal lithotomy position and prepped and draped in the usual sterile fashion. A bivalve speculum was placed in the vagina, the cervix was visualized and the uterine manipulator was placed. Attention was then turned to the abdomen. An infraumbilical incision was made with the scalpel through which the Verres needle was inserted. Opening pressure was -4. The pneumoperitoneum was created until tympany was appreciated above the liver, the Veress needle was removed, and a #10/11 trocar was then inserted through the port. Confirmation in the abdomen ensued. The pelvis was inspected and there was a moderate amount of adhesions to the left adnexa obstructing view of the left ovary and subsequently found a left  salpingectomy. There was endometriosis in the cul-de-sac on the right. The right ovary initially was immobile but eventually was able to be elevated. Two #5 ports were placed under direct visualization, one in the suprapubic area and one in the right lower quadrant, both noted to be free of underlying vessels and bladder. Through these two ports the right tubo-ovarian ligament was grasped and the tube was elevated from the  sidewall. The ovarian tissue was then incised sharply with the scissor; what appeared to be a dermoid was visualized underneath. The ovarian tissue was stabilized with blunt retractors and suction irrigator was used in order to create a better plane. The incision was then extended and the dermoid cyst was excised in its entirety without rupture. A 5-mm camera was placed through the suprapubic port and the dermoid was removed through the infraumbilical port in the bag and, again, intact and was passed off the table. The ovary was irrigated at which point we noticed that there was a small amount of what appeared to be endometrioma fluid. Closer inspection revealed indeed that there was an endometrioma below the dermoid. The area was incised with the scissor. The pelvis was irrigated until the endometrioma fluid was completely removed and the cyst walls were able to be better visualized. The cyst wall was eventually sharply dissected off as the ovary was held between blunt pickups. The ovary was copiously irrigated. There was a small amount of bleeding that was noted that was treated with the Klepinger. It was noted to be hemostatic thereafter. Irrigation confirmed the ovary was hemostatic. The pelvis was irrigated with copious amount of fluid in order to remove any endometrioma-like debris that might have spilled. The upper abdomen was inspected and noted to be hemostatic. The instruments were removed under direct visualization. The infraumbilical port was closed with a  figure-of-eight of 0 Vicryl. The skin was closed with 3-0 plain. Each port was injected with 0.25% Marcaine and Steri-Strips were placed.  The patient tolerated the procedure well. Sponge, lap, and needle counts correct x 2 and she was transferred to the PACU in stable condition. DD:  07/09/00 TD:  07/09/00 Job: [id number]

## 2011-01-20 ENCOUNTER — Other Ambulatory Visit: Payer: Self-pay | Admitting: Family Medicine

## 2011-01-20 NOTE — Telephone Encounter (Signed)
Pt is due for an appt, refilled med and sent note.

## 2011-03-31 ENCOUNTER — Ambulatory Visit (INDEPENDENT_AMBULATORY_CARE_PROVIDER_SITE_OTHER): Payer: BC Managed Care – PPO | Admitting: Family Medicine

## 2011-03-31 ENCOUNTER — Encounter: Payer: Self-pay | Admitting: Family Medicine

## 2011-03-31 ENCOUNTER — Other Ambulatory Visit: Payer: Self-pay | Admitting: Family Medicine

## 2011-03-31 ENCOUNTER — Other Ambulatory Visit (HOSPITAL_COMMUNITY)
Admission: RE | Admit: 2011-03-31 | Discharge: 2011-03-31 | Disposition: A | Discharge status: Home / Self Care | Payer: BC Managed Care – PPO | Source: Ambulatory Visit | Attending: Family Medicine | Admitting: Family Medicine

## 2011-03-31 DIAGNOSIS — M25569 Pain in unspecified knee: Secondary | ICD-10-CM

## 2011-03-31 DIAGNOSIS — I10 Essential (primary) hypertension: Secondary | ICD-10-CM

## 2011-03-31 DIAGNOSIS — R609 Edema, unspecified: Secondary | ICD-10-CM

## 2011-03-31 DIAGNOSIS — E785 Hyperlipidemia, unspecified: Secondary | ICD-10-CM

## 2011-03-31 DIAGNOSIS — Z01419 Encounter for gynecological examination (general) (routine) without abnormal findings: Secondary | ICD-10-CM

## 2011-03-31 DIAGNOSIS — Z0000 Encounter for general adult medical examination without abnormal findings: Secondary | ICD-10-CM

## 2011-03-31 DIAGNOSIS — Z1231 Encounter for screening mammogram for malignant neoplasm of breast: Secondary | ICD-10-CM

## 2011-03-31 DIAGNOSIS — Z124 Encounter for screening for malignant neoplasm of cervix: Secondary | ICD-10-CM | POA: Insufficient documentation

## 2011-03-31 LAB — BASIC METABOLIC PANEL
Calcium: 9.7 mg/dL (ref 8.4–10.5)
GFR: 70.9 mL/min (ref >60.00)
Glucose, Bld: 103 mg/dL — ABNORMAL HIGH (ref 70–99)
Sodium: 140 mEq/L (ref 135–145)

## 2011-03-31 LAB — LIPID PANEL
HDL: 67.5 mg/dL (ref >39.00)
Total CHOL/HDL Ratio: 3
Triglycerides: 173 mg/dL — ABNORMAL HIGH (ref 0.0–149.0)

## 2011-03-31 LAB — HEPATIC FUNCTION PANEL
Albumin: 3.8 g/dL (ref 3.5–5.2)
Alkaline Phosphatase: 54 U/L (ref 39–117)
Bilirubin, Direct: 0 mg/dL (ref 0.0–0.3)

## 2011-03-31 LAB — CBC WITH DIFFERENTIAL/PLATELET
Basophils Absolute: 0 10*3/uL (ref 0.0–0.1)
HCT: 38.5 % (ref 36.0–46.0)
Lymphs Abs: 1.3 10*3/uL (ref 0.7–4.0)
MCHC: 33.4 g/dL (ref 30.0–36.0)
MCV: 86 fl (ref 78.0–100.0)
Monocytes Absolute: 0.4 10*3/uL (ref 0.1–1.0)
Platelets: 285 10*3/uL (ref 150.0–400.0)
RDW: 13.9 % (ref 11.5–14.6)

## 2011-03-31 LAB — LDL CHOLESTEROL, DIRECT: Direct LDL: 115.6 mg/dL

## 2011-03-31 MED ORDER — SIMVASTATIN 20 MG PO TABS: 20.0000 mg | ORAL_TABLET | Freq: Every day | ORAL | Status: DC

## 2011-03-31 MED ORDER — FUROSEMIDE 40 MG PO TABS: ORAL_TABLET | ORAL | Status: DC

## 2011-03-31 MED ORDER — ESCITALOPRAM OXALATE 20 MG PO TABS: 20.0000 mg | ORAL_TABLET | Freq: Every day | ORAL | Status: DC

## 2011-03-31 MED ORDER — LISINOPRIL-HYDROCHLOROTHIAZIDE 10-12.5 MG PO TABS: 1.0000 | ORAL_TABLET | Freq: Every day | ORAL | Status: DC

## 2011-03-31 NOTE — Patient Instructions (Signed)
Follow up in 1 month to recheck BP Take the Lisinopril daily for blood pressure Take the Lasix (now 40mg ) daily for swelling Continue the Naprosyn as needed for knee pain or swelling Work on hamstring stretches We'll notify you of your lab results Keep up the good work on your exercise- i'm impressed! Call with any questions or concerns [facility]!

## 2011-03-31 NOTE — Progress Notes (Signed)
  Subjective:    Patient ID: Alexandra [name], female    DOB: [birth year], 60 y.o.   MRN: [id number]  HPI CPE-  HTN- chronic problem for pt, BP not at goal.  Not taking Lisinopril HCT daily.  Needs refill.  No CP, SOB, HAs, visual changes.  Knee pain- R sided, pain started when she began exercising in April.  Pain is located behind knee.  Has been taking Naprosyn w/ relief.  Pain is most noticeable w/ position change from sitting to standing.  Edema- pt reports this is an ongoing problem despite taking lasix prn.  'people ask me what's wrong'.   Review of Systems Patient reports no vision/ hearing changes, adenopathy,fever, weight change,  persistant/recurrent hoarseness , swallowing issues, chest pain, palpitations,persistant/recurrent cough, hemoptysis, dyspnea (rest/exertional/paroxysmal nocturnal), gastrointestinal bleeding (melena, rectal bleeding), abdominal pain, significant heartburn, bowel changes, GU symptoms (dysuria, hematuria, incontinence), Gyn symptoms (abnormal  bleeding, pain),  syncope, focal weakness, memory loss, numbness & tingling, skin/hair/nail changes, abnormal bruising or bleeding, anxiety, or depression.   +edema    Objective:   Physical Exam  General Appearance:    Alert, cooperative, no distress, appears stated age  Head:    Normocephalic, without obvious abnormality, atraumatic  Eyes:    PERRL, conjunctiva/corneas clear, EOM's intact, fundi    benign, both eyes  Ears:    Normal TM's and external ear canals, both ears  Nose:   Nares normal, septum midline, mucosa normal, no drainage    or sinus tenderness  Throat:   Lips, mucosa, and tongue normal; teeth and gums normal  Neck:   Supple, symmetrical, trachea midline, no adenopathy;    Thyroid: no enlargement/tenderness/nodules  Back:     Symmetric, no curvature, ROM normal, no CVA tenderness  Lungs:     Clear to auscultation bilaterally, respirations unlabored  Chest Wall:    No tenderness or deformity   Heart:     Regular rate and rhythm, S1 and S2 normal, no murmur, rub   or gallop  Breast Exam:    No tenderness, masses, or nipple abnormality  Abdomen:     Soft, non-tender, bowel sounds active all four quadrants,    no masses, no organomegaly  Genitalia:    External genitalia normal, cervix normal in appearance, no CMT, uterus in normal size and position, adnexa w/out mass or tenderness, mucosa pink and moist, no lesions or discharge present  Rectal:    Normal external appearance  Extremities:   Extremities normal, atraumatic, no cyanosis, trace edema bilaterally R knee- no edema, full flexion/extension w/ very tight hamstring, no joint line tenderness  Pulses:   2+ and symmetric all extremities  Skin:   Skin color, texture, turgor normal, no rashes or lesions  Lymph nodes:   Cervical, supraclavicular, and axillary nodes normal  Neurologic:   CNII-XII intact, normal strength, sensation and reflexes    throughout          Assessment & Plan:

## 2011-04-01 ENCOUNTER — Other Ambulatory Visit: Payer: Self-pay | Admitting: Family Medicine

## 2011-04-01 ENCOUNTER — Telehealth: Payer: Self-pay

## 2011-04-01 MED ORDER — FUROSEMIDE 40 MG PO TABS: ORAL_TABLET | ORAL | Status: DC

## 2011-04-01 NOTE — Telephone Encounter (Signed)
Labs mailed

## 2011-04-01 NOTE — Telephone Encounter (Signed)
Message copied by Beverely Low on Wed Apr 01, 2011  8:00 AM ------      Message from: Sheliah Hatch      Created: Wed Apr 01, 2011  7:58 AM       Labs look good!  Keep up the good work!

## 2011-04-01 NOTE — Telephone Encounter (Signed)
Rx sent 

## 2011-04-01 NOTE — Telephone Encounter (Signed)
Message copied by Beverely Low on Wed Apr 01, 2011  3:01 PM ------      Message from: Sheliah Hatch      Created: Wed Apr 01, 2011  1:35 PM       Normal pap

## 2011-04-01 NOTE — Telephone Encounter (Signed)
Pt.notified

## 2011-04-02 ENCOUNTER — Encounter: Payer: Self-pay | Admitting: *Deleted

## 2011-04-02 ENCOUNTER — Other Ambulatory Visit: Payer: Self-pay

## 2011-04-02 LAB — VITAMIN D 1,25 DIHYDROXY: Vitamin D 1, 25 (OH)2 Total: 51 pg/mL (ref 18–72)

## 2011-04-02 MED ORDER — ZOLPIDEM TARTRATE ER 12.5 MG PO TBCR: 12.5000 mg | EXTENDED_RELEASE_TABLET | Freq: Every evening | ORAL | Status: DC | PRN

## 2011-04-02 MED ORDER — DICLOFENAC SODIUM 75 MG PO TBEC: 75.0000 mg | DELAYED_RELEASE_TABLET | Freq: Two times a day (BID) | ORAL | Status: DC

## 2011-04-02 NOTE — Telephone Encounter (Signed)
Call from patient she was seen 03/31/11 and called to request Ambien last filled 03/26/10 #30 with 1 refill and Voltaren 12/24/08 #60 no refills. Would like to [facility] [place]. Please advise    KP

## 2011-04-02 NOTE — Telephone Encounter (Signed)
Rx sent 

## 2011-04-07 NOTE — Assessment & Plan Note (Signed)
Increase lasix to 40mg  daily.  Encouraged her to continue to exercise.  Elevate when possible.  Minimal edema in office today.  Will follow.

## 2011-04-07 NOTE — Assessment & Plan Note (Signed)
Pap collected. 

## 2011-04-07 NOTE — Assessment & Plan Note (Signed)
Pt's posterior knee pain is consistent w/ tight hamstrings- no actual joint pain.  Continue NSAIDs prn.  Work on stretching to improve flexibility.  Pt expressed understanding and is in agreement w/ plan.

## 2011-04-07 NOTE — Assessment & Plan Note (Signed)
Check labs.  Adjust meds prn  

## 2011-04-07 NOTE — Assessment & Plan Note (Signed)
Not at goal today.  Pt not taking meds daily as directed.  Encouraged consistent use of meds.  Will follow.

## 2011-04-07 NOTE — Assessment & Plan Note (Signed)
Pt's PE WNL.  UTD on routine health maintenance.  Check labs.  Anticipatory guidance provided.

## 2011-05-01 ENCOUNTER — Ambulatory Visit: Payer: BC Managed Care – PPO | Admitting: Family Medicine

## 2011-05-08 ENCOUNTER — Encounter: Payer: Self-pay | Admitting: Family Medicine

## 2011-05-08 ENCOUNTER — Ambulatory Visit (INDEPENDENT_AMBULATORY_CARE_PROVIDER_SITE_OTHER): Payer: BC Managed Care – PPO | Admitting: Family Medicine

## 2011-05-08 DIAGNOSIS — Z23 Encounter for immunization: Secondary | ICD-10-CM

## 2011-05-08 DIAGNOSIS — I10 Essential (primary) hypertension: Secondary | ICD-10-CM

## 2011-05-08 DIAGNOSIS — Z2911 Encounter for prophylactic immunotherapy for respiratory syncytial virus (RSV): Secondary | ICD-10-CM

## 2011-05-08 NOTE — Patient Instructions (Signed)
Follow up in 6 months to recheck blood pressure/cholesterol You look GREAT!  Keep up the good work! Call with any questions or concerns Have a great weekend!!

## 2011-05-08 NOTE — Progress Notes (Signed)
  Subjective:    Patient ID: Alexandra [name], female    DOB: 06/19/1951, 60 y.o.   MRN: [id number]  HPI HTN- chronic problem for pt, much improved since starting daily Lisinopril HCT.  No CP, SOB, HAs, visual changes.  Shingles vaccine- pt called insurance company and they told her to proceed w/ injxn.    Review of Systems For ROS see HPI     Objective:   Physical Exam  Vitals reviewed. Constitutional: She is oriented to person, place, and time. She appears well-developed and well-nourished. No distress.  HENT:  Head: Normocephalic and atraumatic.  Eyes: Conjunctivae and EOM are normal. Pupils are equal, round, and reactive to light.  Neck: Normal range of motion. Neck supple. No thyromegaly present.  Cardiovascular: Normal rate, regular rhythm, normal heart sounds and intact distal pulses.   No murmur heard. Pulmonary/Chest: Effort normal and breath sounds normal. No respiratory distress.  Abdominal: Soft. She exhibits no distension. There is no tenderness.  Musculoskeletal: She exhibits no edema.  Lymphadenopathy:    She has no cervical adenopathy.  Neurological: She is alert and oriented to person, place, and time.  Skin: Skin is warm and dry.  Psychiatric: She has a normal mood and affect. Her behavior is normal.          Assessment & Plan:

## 2011-05-08 NOTE — Assessment & Plan Note (Signed)
BP is much better controlled since restarting Lisinopril HCT.  Asymptomatic.  Check BMP.

## 2011-05-09 LAB — BASIC METABOLIC PANEL
BUN: 20 mg/dL (ref 6–23)
Calcium: 9.4 mg/dL (ref 8.4–10.5)
Creat: 0.88 mg/dL (ref 0.50–1.10)
Glucose, Bld: 81 mg/dL (ref 70–99)
Sodium: 138 mEq/L (ref 135–145)

## 2011-05-11 ENCOUNTER — Telehealth: Payer: Self-pay

## 2011-05-11 NOTE — Progress Notes (Signed)
Quick Note:  Left message for pt to call back ______ 

## 2011-05-11 NOTE — Telephone Encounter (Signed)
Message copied by Beverely Low on Mon May 11, 2011  3:26 PM ------      Message from: Sheliah Hatch      Created: Sat May 09, 2011 10:27 AM       K+ is slightly low from the diuretics.  Should increase the amount in her diet in the form of leafy green veggies, bananas, citrus fruits.

## 2011-05-11 NOTE — Telephone Encounter (Signed)
Left message for pt to call back  °

## 2011-05-12 ENCOUNTER — Telehealth: Payer: Self-pay

## 2011-05-12 NOTE — Telephone Encounter (Signed)
Message copied by Beverely Low on Tue May 12, 2011  8:39 AM ------      Message from: Sheliah Hatch      Created: Sat May 09, 2011 10:27 AM       K+ is slightly low from the diuretics.  Should increase the amount in her diet in the form of leafy green veggies, bananas, citrus fruits.

## 2011-05-12 NOTE — Progress Notes (Signed)
Quick Note:  Pt aware ______ 

## 2011-05-12 NOTE — Telephone Encounter (Signed)
Pt advised.

## 2011-08-10 ENCOUNTER — Telehealth: Payer: Self-pay | Admitting: *Deleted

## 2011-08-10 DIAGNOSIS — Z780 Asymptomatic menopausal state: Secondary | ICD-10-CM

## 2011-08-10 NOTE — Telephone Encounter (Signed)
Can switch to dx code of post-menopausal

## 2011-08-10 NOTE — Telephone Encounter (Signed)
Placed order for bone density with dx of post menopausal, canceled duplicate order placed on 03-31-11 per dx code, [facility] to advise change in order to have pt come in for procedure

## 2011-08-10 NOTE — Telephone Encounter (Signed)
[facility]andra [name] from [facility] left vm to advise that the pt had an appt for bone density however noted the dx states something other than preventative healthcare per her insurance will not pay for it with this dx can it be changed to either postmenopausal or osteopetrosis if pt has either?

## 2011-09-18 ENCOUNTER — Ambulatory Visit
Admission: RE | Admit: 2011-09-18 | Discharge: 2011-09-18 | Disposition: A | Discharge status: Home / Self Care | Payer: BC Managed Care – PPO | Source: Ambulatory Visit | Attending: Family Medicine | Admitting: Family Medicine

## 2011-09-18 DIAGNOSIS — Z1231 Encounter for screening mammogram for malignant neoplasm of breast: Secondary | ICD-10-CM

## 2011-09-18 DIAGNOSIS — Z780 Asymptomatic menopausal state: Secondary | ICD-10-CM

## 2011-10-05 ENCOUNTER — Telehealth: Payer: Self-pay | Admitting: *Deleted

## 2011-10-05 NOTE — Telephone Encounter (Signed)
Called pt to advise we had received imaging results from [facility], with instructions from MD Beverely Low stating the following:  Pt has Osteopenia of the Hip, she needs to start 1200 of Ca++ daily re-check in 2 years  .left message to have patient return my call.

## 2011-10-06 ENCOUNTER — Telehealth: Payer: Self-pay | Admitting: *Deleted

## 2011-10-06 NOTE — Telephone Encounter (Signed)
Pt returned my call concerning her imaging report, pt noted that she started taking at least 1200 Ca++ per advised that her DX per MD Tabori Osteopenia of the hip and that after the increase of 1200 Ca++ we will re-check in 2 years. Pt understood.

## 2011-10-09 ENCOUNTER — Encounter: Payer: Self-pay | Admitting: Family Medicine

## 2011-12-23 ENCOUNTER — Telehealth: Payer: Self-pay | Admitting: Family Medicine

## 2011-12-23 ENCOUNTER — Ambulatory Visit (INDEPENDENT_AMBULATORY_CARE_PROVIDER_SITE_OTHER): Payer: BC Managed Care – PPO | Admitting: Family

## 2011-12-23 ENCOUNTER — Encounter: Payer: Self-pay | Admitting: Family

## 2011-12-23 VITALS — BP 110/66 | HR 86 | Temp 98.0°F | Resp 16 | Wt 189.0 lb

## 2011-12-23 DIAGNOSIS — J329 Chronic sinusitis, unspecified: Secondary | ICD-10-CM

## 2011-12-23 MED ORDER — AMOXICILLIN-POT CLAVULANATE 875-125 MG PO TABS: 1.0000 | ORAL_TABLET | Freq: Two times a day (BID) | ORAL | Status: DC

## 2011-12-23 MED ORDER — ALBUTEROL SULFATE HFA 108 (90 BASE) MCG/ACT IN AERS: 2.0000 | INHALATION_SPRAY | Freq: Four times a day (QID) | RESPIRATORY_TRACT | Status: DC | PRN

## 2011-12-23 NOTE — Progress Notes (Signed)
  Subjective:    Patient ID: Alexandra [name], female    DOB: 1951-04-28, 61 y.o.   MRN: [id number]  HPI  Ms.  Alexandra [name] is a 61 yr old female who presents today with chief complaint of nasal congestion and ? Allergies.  She reports that she has been sneezing, coughing, having mild body ache, and increasing nasal congestion.  Symptoms started 4 nights ago.  When she lays flat in bed at night she starts to wheeze.  She has associated cough and denies hx of asthma.  She reports a fever yesterday of 101.     Review of Systems See HPI  Past Medical History  Diagnosis Date  . Depression   . Hyperlipidemia   . Hypertension   . Glaucoma     History   Social History  . Marital Status: Married    Spouse Name: N/A    Number of Children: N/A  . Years of Education: N/A   Occupational History  . Not on file.   Social History Main Topics  . Smoking status: Never Smoker   . Smokeless tobacco: Not on file  . Alcohol Use: Yes  . Drug Use: Not on file  . Sexually Active: Not on file   Other Topics Concern  . Not on file   Social History Narrative  . No narrative on file    Past Surgical History  Procedure Date  . Dermoid cyst  excision     Family History  Problem Relation Age of Onset  . Heart attack Father   . Cancer Sister     lung  . Cancer Brother     throat    No Known Allergies  Current Outpatient Prescriptions on File Prior to Visit  Medication Sig Dispense Refill  . bimatoprost (LUMIGAN) 0.03 % ophthalmic solution 1 drop at bedtime.        . furosemide (LASIX) 40 MG tablet 1 tab daily prn for swelling  90 tablet  1  . lisinopril-hydrochlorothiazide (PRINZIDE,ZESTORETIC) 10-12.5 MG per tablet Take 1 tablet by mouth daily.  90 tablet  3  . simvastatin (ZOCOR) 20 MG tablet Take 1 tablet (20 mg total) by mouth at bedtime.  90 tablet  3  . zolpidem (AMBIEN CR) 12.5 MG CR tablet Take 1 tablet (12.5 mg total) by mouth at bedtime as needed.  30 tablet  2  . albuterol (PROVENTIL  HFA;VENTOLIN HFA) 108 (90 BASE) MCG/ACT inhaler Inhale 2 puffs into the lungs every 6 (six) hours as needed for wheezing.  1 Inhaler  0  . diclofenac (VOLTAREN) 75 MG EC tablet Take 1 tablet (75 mg total) by mouth 2 (two) times daily.  60 tablet  2    BP 110/66  Pulse 86  Temp(Src) 98 F (36.7 C) (Oral)  Resp 16  Wt 189 lb (85.73 kg)  SpO2 99%       Objective:   Physical Exam  Constitutional: She appears well-developed and well-nourished. No distress.  HENT:  Head: Normocephalic and atraumatic.  Right Ear: Tympanic membrane and ear canal normal.  Left Ear: Tympanic membrane and ear canal normal.  Mouth/Throat: No posterior oropharyngeal edema or posterior oropharyngeal erythema.  Cardiovascular: Normal rate and regular rhythm.   No murmur heard. Pulmonary/Chest: Effort normal and breath sounds normal. No respiratory distress. She has no wheezes. She has no rales. She exhibits no tenderness.          Assessment & Plan:

## 2011-12-23 NOTE — Patient Instructions (Signed)

## 2011-12-23 NOTE — Assessment & Plan Note (Signed)
Symptoms consistent with sinusitis. I suspect that her wheezing and cough are a result of the post-nasal drip.  Recommended that she take claritin and will rx with Augmentin.  She is instructed to call if symptoms worsen, or if no improvement in 2-3 days.

## 2011-12-23 NOTE — Telephone Encounter (Signed)
Caller: Lakesha/Patient; PCP: Sheliah Hatch.; CB#: [phone]; Call regarding Cough/Congestion; Onset: 12/18/11. Afebrile. Wheezing intermittently when supine. Advised to see PCP now d/t office is open.  No appt remain at [facility] office; scheduled for 1115 12/23/11 with M. Peggyann Juba, FNP at [facility] office.

## 2011-12-28 ENCOUNTER — Telehealth: Payer: Self-pay | Admitting: *Deleted

## 2011-12-28 MED ORDER — AZITHROMYCIN 250 MG PO TABS: ORAL_TABLET | ORAL | Status: AC

## 2011-12-28 NOTE — Telephone Encounter (Signed)
Discuss with patient, Rx sent. 

## 2011-12-28 NOTE — Telephone Encounter (Signed)
Ok to switch to [employer].  Needs to continue albuterol inhaler as needed and add Mucinex to thin chest congestion.  Delsym for cough.  STOP augmentin

## 2011-12-28 NOTE — Telephone Encounter (Signed)
Pt left VM still c/o chest congestion, cough and some wheezing. Pt is now requesting a Z-pak. .Please advise

## 2011-12-31 ENCOUNTER — Other Ambulatory Visit: Payer: Self-pay | Admitting: *Deleted

## 2011-12-31 NOTE — Telephone Encounter (Signed)
Ok for refill on Albuterol inhaler Pt should finish Zpack- 5 days of meds remain in system for 10 days.  So if she has 2 days of meds, has 8 more days of effective action If no better by next week, needs OV

## 2011-12-31 NOTE — Telephone Encounter (Signed)
Pt called to report that she is currently out of inhaler and would like to get refill on med. Pt still c/o cough, congestion and has Pt only has 2 days left of med. [facility] [place]

## 2012-01-01 MED ORDER — ALBUTEROL SULFATE HFA 108 (90 BASE) MCG/ACT IN AERS: 2.0000 | INHALATION_SPRAY | Freq: Four times a day (QID) | RESPIRATORY_TRACT | Status: DC | PRN

## 2012-01-01 NOTE — Telephone Encounter (Signed)
rx sent to pharmacy by e-script for inhaler, called home number to advise instructions, no answer, [place] and [place] idea of any pharmacies in the area, advised pt the instructions per MD Tabori concerning the ABT to wait til next week and set up OV if no better, pt understood, also advised pt to call back to the office when she finds a pharmacy in the area seh would like the inhaler sent to, also noted if she does not reach the office in time, she can [place] [facility] [place] and [place] the RX for the inhaler per noted approved to send by MD Beverely Low, pt stated she will do that later per she thinks she will be okay with out it but would rather have it, also advised pt we do have on call nurse after hours to give the number to, pt understood all instructions

## 2012-05-16 ENCOUNTER — Ambulatory Visit (INDEPENDENT_AMBULATORY_CARE_PROVIDER_SITE_OTHER): Payer: BC Managed Care – PPO | Admitting: Family Medicine

## 2012-05-16 ENCOUNTER — Other Ambulatory Visit (HOSPITAL_COMMUNITY)
Admission: RE | Admit: 2012-05-16 | Discharge: 2012-05-16 | Disposition: A | Discharge status: Home / Self Care | Payer: BC Managed Care – PPO | Source: Ambulatory Visit | Attending: Family Medicine | Admitting: Family Medicine

## 2012-05-16 ENCOUNTER — Encounter: Payer: Self-pay | Admitting: Family Medicine

## 2012-05-16 VITALS — BP 135/79 | HR 84 | Temp 97.4°F | Ht 63.0 in | Wt 179.6 lb

## 2012-05-16 DIAGNOSIS — Z124 Encounter for screening for malignant neoplasm of cervix: Secondary | ICD-10-CM

## 2012-05-16 DIAGNOSIS — I10 Essential (primary) hypertension: Secondary | ICD-10-CM

## 2012-05-16 DIAGNOSIS — Z01419 Encounter for gynecological examination (general) (routine) without abnormal findings: Secondary | ICD-10-CM

## 2012-05-16 DIAGNOSIS — E785 Hyperlipidemia, unspecified: Secondary | ICD-10-CM

## 2012-05-16 LAB — CBC WITH DIFFERENTIAL/PLATELET
Basophils Absolute: 0 10*3/uL (ref 0.0–0.1)
Eosinophils Absolute: 0.1 10*3/uL (ref 0.0–0.7)
HCT: 41.9 % (ref 36.0–46.0)
Lymphs Abs: 1.5 10*3/uL (ref 0.7–4.0)
MCHC: 33.2 g/dL (ref 30.0–36.0)
Monocytes Relative: 7.6 % (ref 3.0–12.0)
Platelets: 281 10*3/uL (ref 150.0–400.0)
RDW: 13 % (ref 11.5–14.6)

## 2012-05-16 LAB — HEPATIC FUNCTION PANEL
Alkaline Phosphatase: 54 U/L (ref 39–117)
Bilirubin, Direct: 0.1 mg/dL (ref 0.0–0.3)
Total Bilirubin: 1 mg/dL (ref 0.3–1.2)
Total Protein: 7.3 g/dL (ref 6.0–8.3)

## 2012-05-16 LAB — BASIC METABOLIC PANEL
CO2: 27 mEq/L (ref 19–32)
Calcium: 9.6 mg/dL (ref 8.4–10.5)
GFR: 73.55 mL/min (ref >60.00)
Sodium: 137 mEq/L (ref 135–145)

## 2012-05-16 LAB — LIPID PANEL
Cholesterol: 192 mg/dL (ref 0–200)
HDL: 69.1 mg/dL (ref >39.00)
LDL Cholesterol: 88 mg/dL (ref 0–99)
Total CHOL/HDL Ratio: 3
Triglycerides: 176 mg/dL — ABNORMAL HIGH (ref 0.0–149.0)
VLDL: 35.2 mg/dL (ref 0.0–40.0)

## 2012-05-16 MED ORDER — SIMVASTATIN 20 MG PO TABS: 20.0000 mg | ORAL_TABLET | Freq: Every day | ORAL | Status: DC

## 2012-05-16 MED ORDER — ZOLPIDEM TARTRATE ER 12.5 MG PO TBCR: 12.5000 mg | EXTENDED_RELEASE_TABLET | Freq: Every evening | ORAL | Status: DC | PRN

## 2012-05-16 MED ORDER — LISINOPRIL-HYDROCHLOROTHIAZIDE 10-12.5 MG PO TABS: 1.0000 | ORAL_TABLET | Freq: Every day | ORAL | Status: DC

## 2012-05-16 NOTE — Patient Instructions (Addendum)
Follow up in 6 months to recheck BP and cholesterol We'll notify you of your lab results and make any changes if needed Keep up the good work!  You look good! Call with any questions or concerns Hang in there!!!

## 2012-05-16 NOTE — Progress Notes (Signed)
  Subjective:    Patient ID: Alexandra [name], female    DOB: [birth year], 61 y.o.   MRN: [id number]  HPI CPE- due for pap, UTD on mammo/DEXA, colonoscopy  HTN- chronic problem, fair control on Lisinopril HCT.  No CP, SOB, HAs, visual changes, edema.  Hyperlipidemia- chronic problem, on Zocor.  Due for labs.  Denies abd pain, N/V, myalgias.   Review of Systems Patient reports no vision/ hearing changes, adenopathy,fever, weight change,  persistant/recurrent hoarseness , swallowing issues, chest pain, palpitations, edema, persistant/recurrent cough, hemoptysis, dyspnea (rest/exertional/paroxysmal nocturnal), gastrointestinal bleeding (melena, rectal bleeding), abdominal pain, significant heartburn, bowel changes, GU symptoms (dysuria, hematuria, incontinence), Gyn symptoms (abnormal  bleeding, pain),  syncope, focal weakness, memory loss, numbness & tingling, skin/hair/nail changes, abnormal bruising or bleeding, anxiety, or depression.     Objective:   Physical Exam  General Appearance:    Alert, cooperative, no distress, appears stated age  Head:    Normocephalic, without obvious abnormality, atraumatic  Eyes:    PERRL, conjunctiva/corneas clear, EOM's intact, fundi    benign, both eyes  Ears:    Normal TM's and external ear canals, both ears  Nose:   Nares normal, septum midline, mucosa normal, no drainage    or sinus tenderness  Throat:   Lips, mucosa, and tongue normal; teeth and gums normal  Neck:   Supple, symmetrical, trachea midline, no adenopathy;    Thyroid: no enlargement/tenderness/nodules  Back:     Symmetric, no curvature, ROM normal, no CVA tenderness  Lungs:     Clear to auscultation bilaterally, respirations unlabored  Chest Wall:    No tenderness or deformity   Heart:    Regular rate and rhythm, S1 and S2 normal, no murmur, rub   or gallop  Breast Exam:    No tenderness, masses, or nipple abnormality  Abdomen:     Soft, non-tender, bowel sounds active all four quadrants,      no masses, no organomegaly  Genitalia:    External genitalia normal, cervix normal in appearance, no CMT, uterus in normal size and position, adnexa w/out mass or tenderness, mucosa pink and moist, no lesions or discharge present  Rectal:    Normal external appearance  Extremities:   Extremities normal, atraumatic, no cyanosis or edema  Pulses:   2+ and symmetric all extremities  Skin:   Skin color, texture, turgor normal, no rashes or lesions  Lymph nodes:   Cervical, supraclavicular, and axillary nodes normal  Neurologic:   CNII-XII intact, normal strength, sensation and reflexes    throughout          Assessment & Plan:

## 2012-05-16 NOTE — Assessment & Plan Note (Signed)
Chronic problem.  Adequate but not ideal control.  Asymptomatic.  Tolerating meds w/out difficulty.  Check labs.  No anticipated changes.

## 2012-05-16 NOTE — Assessment & Plan Note (Signed)
Pap collected. 

## 2012-05-16 NOTE — Assessment & Plan Note (Signed)
Chronic problem.  Tolerating statin w/out difficulty.  Has lost 7 lbs since last visit.  Applauded efforts.  Check labs.  Adjust meds prn

## 2012-05-16 NOTE — Assessment & Plan Note (Signed)
Pt's PE WNL.  UTD on health maintenance.  Check labs.  Anticipatory guidance provided.  

## 2012-05-19 LAB — VITAMIN D 1,25 DIHYDROXY
Vitamin D 1, 25 (OH)2 Total: 63 pg/mL (ref 18–72)
Vitamin D3 1, 25 (OH)2: 63 pg/mL

## 2012-05-23 ENCOUNTER — Encounter: Payer: Self-pay | Admitting: Family Medicine

## 2012-05-24 MED ORDER — LISINOPRIL-HYDROCHLOROTHIAZIDE 10-12.5 MG PO TABS: 1.0000 | ORAL_TABLET | Freq: Every day | ORAL | Status: DC

## 2012-05-24 MED ORDER — SIMVASTATIN 20 MG PO TABS: 20.0000 mg | ORAL_TABLET | Freq: Every day | ORAL | Status: DC

## 2012-05-24 NOTE — Telephone Encounter (Signed)
rx sent to pharmacy by e-script [facility]

## 2012-07-19 ENCOUNTER — Other Ambulatory Visit: Payer: Self-pay | Admitting: Family Medicine

## 2012-07-19 DIAGNOSIS — Z1231 Encounter for screening mammogram for malignant neoplasm of breast: Secondary | ICD-10-CM

## 2012-07-28 ENCOUNTER — Encounter: Payer: Self-pay | Admitting: Family Medicine

## 2012-07-28 DIAGNOSIS — N644 Mastodynia: Secondary | ICD-10-CM

## 2012-08-09 ENCOUNTER — Ambulatory Visit
Admission: RE | Admit: 2012-08-09 | Discharge: 2012-08-09 | Disposition: A | Discharge status: Home / Self Care | Payer: BC Managed Care – PPO | Source: Ambulatory Visit | Attending: Family Medicine | Admitting: Family Medicine

## 2012-08-09 DIAGNOSIS — N644 Mastodynia: Secondary | ICD-10-CM

## 2012-09-06 ENCOUNTER — Telehealth: Payer: Self-pay | Admitting: Family Medicine

## 2012-09-06 NOTE — Telephone Encounter (Signed)
Called the pt to see how she was feeling.  Pt stated that she was feeling a lot better.  Pt said she was feeling better 1 hour after it happened.  She said she did not have to do any of the things the caller nurse recommended.  She said that she would check her BP when she gets home.  Pt said she has alittle head congestion and she did not know if that had anything to do with it.  Pt stated that she just wanted Dr. Beverely Low to know what had happened, and see what she thinks about the what happened to her.   Please advise.//AB/CMA

## 2012-09-06 NOTE — Telephone Encounter (Signed)
Patient Information:  Caller Name: Alexandra [name]  Phone: [phone]  Patient: Alexandra, [name]  Gender: Female  DOB: 1951/02/27  Age: 62 Years  PCP: Alexandra [name].  Office Follow Up:  Does the office need to follow up with this patient?: No  Instructions For The Office: N/A   Symptoms  Reason For Call & Symptoms: Patient had been standing for 20 minutes during a presentation and she just passed out. She was out for about 10-15 seconds. No Dizziness. Blood Pressure was 156/93 now it's 130/80. Marland Kitchen No other symptoms right now.  Reviewed Health History In EMR: Yes  Reviewed Medications In EMR: Yes  Reviewed Allergies In EMR: Yes  Reviewed Surgeries / Procedures: Yes  Date of Onset of Symptoms: 09/06/2012  Guideline(s) Used:  High Blood Pressure  Dizziness  Disposition Per Guideline:   Home Care  Reason For Disposition Reached:   Sudden or prolonged standing probably causing dizziness  Advice Given:  Drink Fluids:  Drink several glasses of fruit juice, other clear fluids, or water. This will improve hydration and blood glucose. If you have a fever or have had heat exposure, make sure the fluids are cold.  Rest for 1-2 Hours:  Lie down with feet elevated for 1 hour. This will improve blood flow and increase blood flow to the brain.  Stand Up Slowly:  In the mornings, sit up for a few minutes before you stand up. That will help your blood flow make the adjustment.  If you have to stand up for long periods of time, contract and relax your leg muscles to help pump the blood back to the heart.  Sit down or lie down if you feel dizzy.  Call Back If:  Still feel dizzy after 2 hours of rest and fluids  Passes out (faints)  You become worse.

## 2012-09-07 NOTE — Telephone Encounter (Signed)
This can occur in high pressure situations or when blood sugar drops low.  She should make sure she is drinking plenty of fluids and eating regularly.

## 2012-09-07 NOTE — Telephone Encounter (Signed)
LM @  (4:58pm) asking the pt to RTC.//AB/CMA

## 2012-09-12 NOTE — Telephone Encounter (Addendum)
Spoke with the pt and informed her that Dr. Beverely Low said the spell she had could be occur in high pressure situations or when blood sugar drops low. She should make sure she is drinking plenty of fluids and eating regularly.  Pt understood and agreed.  She stated she will be scheduling an appt in April.//AB/CMA

## 2012-09-24 ENCOUNTER — Other Ambulatory Visit: Payer: Self-pay

## 2012-10-10 ENCOUNTER — Inpatient Hospital Stay: Admission: RE | Admit: 2012-10-10 | Payer: Self-pay | Source: Ambulatory Visit

## 2012-10-10 ENCOUNTER — Ambulatory Visit: Payer: BC Managed Care – PPO

## 2012-11-02 ENCOUNTER — Ambulatory Visit
Admission: RE | Admit: 2012-11-02 | Discharge: 2012-11-02 | Disposition: A | Discharge status: Home / Self Care | Payer: BC Managed Care – PPO | Source: Ambulatory Visit | Attending: Family Medicine | Admitting: Family Medicine

## 2012-11-02 DIAGNOSIS — Z1231 Encounter for screening mammogram for malignant neoplasm of breast: Secondary | ICD-10-CM

## 2012-11-09 ENCOUNTER — Other Ambulatory Visit: Payer: Self-pay | Admitting: Dermatology

## 2012-12-01 ENCOUNTER — Encounter: Payer: Self-pay | Admitting: Lab

## 2012-12-02 ENCOUNTER — Ambulatory Visit (INDEPENDENT_AMBULATORY_CARE_PROVIDER_SITE_OTHER): Payer: BC Managed Care – PPO | Admitting: Family Medicine

## 2012-12-02 ENCOUNTER — Encounter: Payer: Self-pay | Admitting: Family Medicine

## 2012-12-02 VITALS — BP 100/70 | HR 65 | Temp 97.7°F | Ht 63.75 in | Wt 186.2 lb

## 2012-12-02 DIAGNOSIS — I10 Essential (primary) hypertension: Secondary | ICD-10-CM

## 2012-12-02 DIAGNOSIS — E785 Hyperlipidemia, unspecified: Secondary | ICD-10-CM

## 2012-12-02 MED ORDER — LISINOPRIL-HYDROCHLOROTHIAZIDE 10-12.5 MG PO TABS: 1.0000 | ORAL_TABLET | Freq: Every day | ORAL | Status: DC

## 2012-12-02 MED ORDER — DICLOFENAC SODIUM 75 MG PO TBEC: 75.0000 mg | DELAYED_RELEASE_TABLET | Freq: Two times a day (BID) | ORAL | Status: DC

## 2012-12-02 MED ORDER — SIMVASTATIN 20 MG PO TABS: 20.0000 mg | ORAL_TABLET | Freq: Every day | ORAL | Status: DC

## 2012-12-02 NOTE — Patient Instructions (Addendum)
Schedule your complete physical in 6 months Keep up the good work!  You look great! I'm so proud of you!! Happy Spring!!!

## 2012-12-02 NOTE — Progress Notes (Signed)
  Subjective:    Patient ID: Alexandra [name], female    DOB: 1951/07/02, 62 y.o.   MRN: [id number]  HPI HTN- chronic problem, EXCELLENT control today.  On Lisinopril HCTZ.  Denies CP, SOB, HAs, visual changes, edema.  Exercising regularly.  Hyperlipidemia- chronic problem, on Zocor daily.  Denies abd pain, N/V, myalgias.  Reviewed recent labs- excellent control.   Review of Systems For ROS see HPI     Objective:   Physical Exam  Vitals reviewed. Constitutional: She is oriented to person, place, and time. She appears well-developed and well-nourished. No distress.  HENT:  Head: Normocephalic and atraumatic.  Eyes: Conjunctivae and EOM are normal. Pupils are equal, round, and reactive to light.  Neck: Normal range of motion. Neck supple. No thyromegaly present.  Cardiovascular: Normal rate, regular rhythm, normal heart sounds and intact distal pulses.   No murmur heard. Pulmonary/Chest: Effort normal and breath sounds normal. No respiratory distress.  Abdominal: Soft. She exhibits no distension. There is no tenderness.  Musculoskeletal: She exhibits no edema.  Lymphadenopathy:    She has no cervical adenopathy.  Neurological: She is alert and oriented to person, place, and time.  Skin: Skin is warm and dry.  Psychiatric: She has a normal mood and affect. Her behavior is normal.          Assessment & Plan:

## 2012-12-04 NOTE — Assessment & Plan Note (Signed)
Chronic problem.  Tolerating med w/out difficulty.  Reviewed recently labs done at work that show HDL is fantastic while LDL and trigs have both improved.  Applauded pt's efforts.  Continue to follow.  No changes.

## 2012-12-04 NOTE — Assessment & Plan Note (Signed)
Chronic problem.  Excellent control today.  Asymptomatic.  No changes.

## 2013-05-19 ENCOUNTER — Encounter: Payer: Self-pay | Admitting: Lab

## 2013-05-22 ENCOUNTER — Telehealth: Payer: Self-pay

## 2013-05-22 NOTE — Telephone Encounter (Addendum)
LM for CB-unable to reach Medication and allergies:  Pharmacy updated, uses  for 90 day supply Pharmacy updated, uses  for local pharmacy  HM UTD: Yes Immunizations due: flu  A/P:  Last:  PAP:    UTD     05/2012     MMG: UTD 10/2012 Dexa: UTD 09/2011 CCS:  UTD  10/2008 DM: na HTN: due Lipids: due  To Discuss with Provider:

## 2013-05-23 ENCOUNTER — Encounter: Payer: Self-pay | Admitting: Family Medicine

## 2013-05-23 ENCOUNTER — Ambulatory Visit (INDEPENDENT_AMBULATORY_CARE_PROVIDER_SITE_OTHER): Payer: BC Managed Care – PPO | Admitting: Family Medicine

## 2013-05-23 VITALS — BP 110/74 | HR 64 | Temp 98.1°F | Resp 16 | Ht 63.25 in | Wt 178.5 lb

## 2013-05-23 DIAGNOSIS — Z0000 Encounter for general adult medical examination without abnormal findings: Secondary | ICD-10-CM

## 2013-05-23 DIAGNOSIS — Z01419 Encounter for gynecological examination (general) (routine) without abnormal findings: Secondary | ICD-10-CM

## 2013-05-23 LAB — LIPID PANEL
Cholesterol: 191 mg/dL (ref 0–200)
LDL Cholesterol: 96 mg/dL (ref 0–99)
Total CHOL/HDL Ratio: 3
VLDL: 31.6 mg/dL (ref 0.0–40.0)

## 2013-05-23 LAB — CBC WITH DIFFERENTIAL/PLATELET
Basophils Absolute: 0 10*3/uL (ref 0.0–0.1)
Eosinophils Absolute: 0.1 10*3/uL (ref 0.0–0.7)
Lymphocytes Relative: 36 % (ref 12.0–46.0)
MCHC: 33.8 g/dL (ref 30.0–36.0)
Monocytes Relative: 8.5 % (ref 3.0–12.0)
Neutrophils Relative %: 52.1 % (ref 43.0–77.0)
RBC: 4.63 Mil/uL (ref 3.87–5.11)
RDW: 13.1 % (ref 11.5–14.6)

## 2013-05-23 LAB — BASIC METABOLIC PANEL
Chloride: 103 mEq/L (ref 96–112)
Potassium: 4 mEq/L (ref 3.5–5.1)

## 2013-05-23 LAB — HEPATIC FUNCTION PANEL
ALT: 20 U/L (ref 0–35)
AST: 25 U/L (ref 0–37)
Alkaline Phosphatase: 42 U/L (ref 39–117)
Bilirubin, Direct: 0 mg/dL (ref 0.0–0.3)
Total Bilirubin: 0.9 mg/dL (ref 0.3–1.2)

## 2013-05-23 MED ORDER — SIMVASTATIN 20 MG PO TABS: 20.0000 mg | ORAL_TABLET | Freq: Every day | ORAL | Status: DC

## 2013-05-23 MED ORDER — LISINOPRIL-HYDROCHLOROTHIAZIDE 10-12.5 MG PO TABS: 1.0000 | ORAL_TABLET | Freq: Every day | ORAL | Status: DC

## 2013-05-23 MED ORDER — FUROSEMIDE 40 MG PO TABS: 40.0000 mg | ORAL_TABLET | Freq: Every day | ORAL | Status: DC

## 2013-05-23 NOTE — Patient Instructions (Signed)
Follow up in 6 months to recheck BP and cholesterol We'll notify you of your lab results and make any changes if needed Keep up the good work!  You look great! Call with any questions or concerns Happy Fall!!! 

## 2013-05-23 NOTE — Progress Notes (Signed)
  Subjective:    Patient ID: Alexandra [name], female    DOB: [birth year], 62 y.o.   MRN: [id number]  HPI CPE- no concerns today.  UTD on pap, mammo, colonoscopy, DEXA.   Review of Systems Patient reports no vision/ hearing changes, adenopathy,fever, weight change,  persistant/recurrent hoarseness , swallowing issues, chest pain, palpitations, edema, persistant/recurrent cough, hemoptysis, dyspnea (rest/exertional/paroxysmal nocturnal), gastrointestinal bleeding (melena, rectal bleeding), abdominal pain, significant heartburn, bowel changes, GU symptoms (dysuria, hematuria, incontinence), Gyn symptoms (abnormal  bleeding, pain),  syncope, focal weakness, memory loss, numbness & tingling, skin/hair/nail changes, abnormal bruising or bleeding, anxiety, or depression.     Objective:   Physical Exam   General Appearance:    Alert, cooperative, no distress, appears stated age  Head:    Normocephalic, without obvious abnormality, atraumatic  Eyes:    PERRL, conjunctiva/corneas clear, EOM's intact, fundi    benign, both eyes  Ears:    Normal TM's and external ear canals, both ears  Nose:   Nares normal, septum midline, mucosa normal, no drainage    or sinus tenderness  Throat:   Lips, mucosa, and tongue normal; teeth and gums normal  Neck:   Supple, symmetrical, trachea midline, no adenopathy;    Thyroid: no enlargement/tenderness/nodules  Back:     Symmetric, no curvature, ROM normal, no CVA tenderness  Lungs:     Clear to auscultation bilaterally, respirations unlabored  Chest Wall:    No tenderness or deformity   Heart:    Regular rate and rhythm, S1 and S2 normal, no murmur, rub   or gallop  Breast Exam:    No tenderness, masses, or nipple abnormality  Abdomen:     Soft, non-tender, bowel sounds active all four quadrants,    no masses, no organomegaly  Genitalia:    deferred  Rectal:    Extremities:   Extremities normal, atraumatic, no cyanosis or edema  Pulses:   2+ and symmetric all  extremities  Skin:   Skin color, texture, turgor normal, no rashes or lesions  Lymph nodes:   Cervical, supraclavicular, and axillary nodes normal  Neurologic:   CNII-XII intact, normal strength, sensation and reflexes    throughout          Assessment & Plan:

## 2013-05-24 ENCOUNTER — Encounter: Payer: Self-pay | Admitting: Family Medicine

## 2013-05-25 NOTE — Assessment & Plan Note (Signed)
Pt's PE WNL.  UTD on pap, mammo, DEXA, colonoscopy.  Check labs.  Anticipatory guidance provided.  

## 2013-05-28 LAB — VITAMIN D 1,25 DIHYDROXY
Vitamin D 1, 25 (OH)2 Total: 55 pg/mL (ref 18–72)
Vitamin D3 1, 25 (OH)2: 55 pg/mL

## 2013-05-29 ENCOUNTER — Other Ambulatory Visit: Payer: Self-pay | Admitting: Family Medicine

## 2013-05-29 ENCOUNTER — Other Ambulatory Visit: Payer: Self-pay | Admitting: General Practice

## 2013-05-29 MED ORDER — ZOLPIDEM TARTRATE ER 12.5 MG PO TBCR: 12.5000 mg | EXTENDED_RELEASE_TABLET | Freq: Every evening | ORAL | Status: DC | PRN

## 2013-05-29 NOTE — Telephone Encounter (Signed)
Med filled #30 with 0 per Dr. Laury Axon

## 2013-05-29 NOTE — Telephone Encounter (Signed)
Last OV 05-23-13 Last filled 05-16-12 #30 with 2 refills.   Contract on file.

## 2013-06-01 ENCOUNTER — Telehealth: Payer: Self-pay | Admitting: *Deleted

## 2013-06-01 NOTE — Telephone Encounter (Signed)
Patient called and stated that she had spoken with you about her anxiety and stress. Patient stated that she was put on medication for that. She states that she believes that the medication is not working and would like to try something different. Patient states that she has done some research on Pristeq and would like try that if you are okay with it. Please advise. SW, CMA

## 2013-06-02 NOTE — Telephone Encounter (Signed)
Would like the pt to wait for Dr Beverely Low

## 2013-06-02 NOTE — Telephone Encounter (Signed)
Attempted to leave message on voicemail but got the busy signal.

## 2013-06-04 NOTE — Telephone Encounter (Signed)
Ok for [employer] 50mg  daily, #30, 3 refills

## 2013-06-05 MED ORDER — DESVENLAFAXINE SUCCINATE ER 50 MG PO TB24: 50.0000 mg | ORAL_TABLET | Freq: Every day | ORAL | Status: DC

## 2013-06-05 NOTE — Telephone Encounter (Signed)
Patient notified and Rx was sent to pharmacy  

## 2013-07-13 ENCOUNTER — Other Ambulatory Visit: Payer: Self-pay | Admitting: Dermatology

## 2013-07-20 ENCOUNTER — Encounter: Payer: Self-pay | Admitting: Family Medicine

## 2013-08-22 ENCOUNTER — Other Ambulatory Visit: Payer: Self-pay | Admitting: General Practice

## 2013-08-22 MED ORDER — DESVENLAFAXINE SUCCINATE ER 50 MG PO TB24: 50.0000 mg | ORAL_TABLET | Freq: Every day | ORAL | Status: DC

## 2013-08-24 ENCOUNTER — Other Ambulatory Visit: Payer: Self-pay | Admitting: General Practice

## 2013-08-24 MED ORDER — DESVENLAFAXINE SUCCINATE ER 50 MG PO TB24: 50.0000 mg | ORAL_TABLET | Freq: Every day | ORAL | Status: DC

## 2013-09-15 ENCOUNTER — Other Ambulatory Visit: Payer: Self-pay | Admitting: Family Medicine

## 2013-09-15 NOTE — Telephone Encounter (Signed)
Med filled.  

## 2013-09-25 ENCOUNTER — Other Ambulatory Visit: Payer: Self-pay

## 2013-09-25 DIAGNOSIS — Z1231 Encounter for screening mammogram for malignant neoplasm of breast: Secondary | ICD-10-CM

## 2013-11-03 ENCOUNTER — Ambulatory Visit
Admission: RE | Admit: 2013-11-03 | Discharge: 2013-11-03 | Disposition: A | Discharge status: Home / Self Care | Payer: BC Managed Care – PPO | Source: Ambulatory Visit

## 2013-11-03 DIAGNOSIS — Z1231 Encounter for screening mammogram for malignant neoplasm of breast: Secondary | ICD-10-CM

## 2013-11-21 ENCOUNTER — Ambulatory Visit (INDEPENDENT_AMBULATORY_CARE_PROVIDER_SITE_OTHER): Payer: BC Managed Care – PPO | Admitting: Family Medicine

## 2013-11-21 ENCOUNTER — Encounter: Payer: Self-pay | Admitting: Family Medicine

## 2013-11-21 VITALS — BP 120/80 | HR 72 | Temp 98.2°F | Resp 16 | Wt 168.2 lb

## 2013-11-21 DIAGNOSIS — I10 Essential (primary) hypertension: Secondary | ICD-10-CM

## 2013-11-21 DIAGNOSIS — F329 Major depressive disorder, single episode, unspecified: Secondary | ICD-10-CM

## 2013-11-21 DIAGNOSIS — F3289 Other specified depressive episodes: Secondary | ICD-10-CM

## 2013-11-21 DIAGNOSIS — E785 Hyperlipidemia, unspecified: Secondary | ICD-10-CM

## 2013-11-21 MED ORDER — VENLAFAXINE HCL ER 150 MG PO CP24: 150.0000 mg | ORAL_CAPSULE | Freq: Every day | ORAL | Status: DC

## 2013-11-21 MED ORDER — HYDROCHLOROTHIAZIDE 12.5 MG PO TABS: 12.5000 mg | ORAL_TABLET | Freq: Every day | ORAL | Status: DC

## 2013-11-21 NOTE — Assessment & Plan Note (Signed)
Chronic problem.  Tolerating statin w/o difficulty.  Reviewed recent labs done at work.  Excellent!  No changes

## 2013-11-21 NOTE — Assessment & Plan Note (Signed)
Chronic problem.  sxs are not adequately control on 3x/week Pristiq.  Pt is taking it this way due to cost of med.  Will switch to generic Effexor and monitor for improvement.

## 2013-11-21 NOTE — Patient Instructions (Signed)
Schedule your complete physical in 6 months STOP the lisinopril HCTZ and START the plain HCTZ when it arrives STOP the Pristiq and START the Venlafaxine (effexor) when it arrives Keep up the good work!  You look great! Happy Spring!!

## 2013-11-21 NOTE — Progress Notes (Signed)
Pre visit review using our clinic review tool, if applicable. No additional management support is needed unless otherwise documented below in the visit note. 

## 2013-11-21 NOTE — Assessment & Plan Note (Signed)
Chronic problem.  Excellent control.  Will stop Lisinopril and continue HCTZ daily.  Pt to continue to monitor home BP and call if they elevate after changing meds.  Will follow.

## 2013-11-21 NOTE — Progress Notes (Signed)
   Subjective:    Patient ID: [name], female    DOB: 1951/05/15, 63 y.o.   MRN: [id number]  HPI HTN- chronic problem, well controlled today on Lisinopril HCTZ daily.  Has been losing weight- about 20 lbs.  No CP, SOB, HAs, visual changes, edema.  Hyperlipidemia- chronic problem, well controlled on Simvastatin.  HDL 77, LDL 96, trigs 99.  No abd pain, N/V, myalgias.  Depression- chronic problem, taking Pristiq 3x/week.  Isn't sure that 3x/week is effective at improving mood.  Finds herself getting upset frequently at work, has self esteem issues.  Med is expensive.   Review of Systems For ROS see HPI     Objective:   Physical Exam  Vitals reviewed. Constitutional: She is oriented to person, place, and time. She appears well-developed and well-nourished. No distress.  HENT:  Head: Normocephalic and atraumatic.  Eyes: Conjunctivae and EOM are normal. Pupils are equal, round, and reactive to light.  Neck: Normal range of motion. Neck supple. No thyromegaly present.  Cardiovascular: Normal rate, regular rhythm, normal heart sounds and intact distal pulses.   No murmur heard. Pulmonary/Chest: Effort normal and breath sounds normal. No respiratory distress.  Abdominal: Soft. She exhibits no distension. There is no tenderness.  Musculoskeletal: She exhibits no edema.  Lymphadenopathy:    She has no cervical adenopathy.  Neurological: She is alert and oriented to person, place, and time.  Skin: Skin is warm and dry.  Psychiatric: She has a normal mood and affect. Her behavior is normal.          Assessment & Plan:

## 2013-11-22 ENCOUNTER — Telehealth: Payer: Self-pay | Admitting: Family Medicine

## 2013-11-22 NOTE — Telephone Encounter (Signed)
Relevant patient education assigned to patient using Emmi. ° °

## 2013-12-07 ENCOUNTER — Other Ambulatory Visit: Payer: Self-pay | Admitting: Dermatology

## 2014-06-01 ENCOUNTER — Ambulatory Visit (INDEPENDENT_AMBULATORY_CARE_PROVIDER_SITE_OTHER): Payer: BC Managed Care – PPO | Admitting: Family Medicine

## 2014-06-01 ENCOUNTER — Encounter: Payer: Self-pay | Admitting: Family Medicine

## 2014-06-01 VITALS — BP 118/76 | HR 71 | Temp 98.1°F | Resp 16 | Ht 63.25 in | Wt 173.0 lb

## 2014-06-01 DIAGNOSIS — Z0000 Encounter for general adult medical examination without abnormal findings: Secondary | ICD-10-CM

## 2014-06-01 DIAGNOSIS — Z23 Encounter for immunization: Secondary | ICD-10-CM

## 2014-06-01 LAB — LIPID PANEL
CHOLESTEROL: 186 mg/dL (ref 0–200)
HDL: 73.9 mg/dL (ref >39.00)
LDL CALC: 91 mg/dL (ref 0–99)
NonHDL: 112.1
TRIGLYCERIDES: 104 mg/dL (ref 0.0–149.0)
Total CHOL/HDL Ratio: 3
VLDL: 20.8 mg/dL (ref 0.0–40.0)

## 2014-06-01 LAB — CBC WITH DIFFERENTIAL/PLATELET
BASOS ABS: 0 10*3/uL (ref 0.0–0.1)
Basophils Relative: 0.6 % (ref 0.0–3.0)
EOS ABS: 0.1 10*3/uL (ref 0.0–0.7)
Eosinophils Relative: 2.6 % (ref 0.0–5.0)
HCT: 43.5 % (ref 36.0–46.0)
HEMOGLOBIN: 14.4 g/dL (ref 12.0–15.0)
LYMPHS PCT: 35.8 % (ref 12.0–46.0)
Lymphs Abs: 1.5 10*3/uL (ref 0.7–4.0)
MCHC: 33 g/dL (ref 30.0–36.0)
MCV: 90.3 fl (ref 78.0–100.0)
MONOS PCT: 9.3 % (ref 3.0–12.0)
Monocytes Absolute: 0.4 10*3/uL (ref 0.1–1.0)
NEUTROS PCT: 51.7 % (ref 43.0–77.0)
Neutro Abs: 2.2 10*3/uL (ref 1.4–7.7)
Platelets: 236 10*3/uL (ref 150.0–400.0)
RBC: 4.82 Mil/uL (ref 3.87–5.11)
RDW: 13.1 % (ref 11.5–15.5)
WBC: 4.2 10*3/uL (ref 4.0–10.5)

## 2014-06-01 LAB — TSH: TSH: 1.93 u[IU]/mL (ref 0.35–4.50)

## 2014-06-01 LAB — HEPATIC FUNCTION PANEL
ALBUMIN: 3.5 g/dL (ref 3.5–5.2)
ALK PHOS: 44 U/L (ref 39–117)
ALT: 20 U/L (ref 0–35)
AST: 23 U/L (ref 0–37)
BILIRUBIN DIRECT: 0.2 mg/dL (ref 0.0–0.3)
TOTAL PROTEIN: 7 g/dL (ref 6.0–8.3)
Total Bilirubin: 1.2 mg/dL (ref 0.2–1.2)

## 2014-06-01 LAB — BASIC METABOLIC PANEL
BUN: 22 mg/dL (ref 6–23)
CALCIUM: 9.5 mg/dL (ref 8.4–10.5)
CO2: 29 meq/L (ref 19–32)
Chloride: 105 mEq/L (ref 96–112)
Creatinine, Ser: 0.9 mg/dL (ref 0.4–1.2)
GFR: 71.09 mL/min (ref >60.00)
GLUCOSE: 95 mg/dL (ref 70–99)
POTASSIUM: 3.7 meq/L (ref 3.5–5.1)
SODIUM: 138 meq/L (ref 135–145)

## 2014-06-01 LAB — VITAMIN D 25 HYDROXY (VIT D DEFICIENCY, FRACTURES): VITD: 44.62 ng/mL (ref 30.00–100.00)

## 2014-06-01 NOTE — Patient Instructions (Signed)
Follow up in 6 months to recheck BP and cholesterol Start the Venlafaxine daily for mood- give it 2 weeks and if it's not working or you don't like it, call me! We'll notify you of your lab results and make any changes if needed Call with any questions or concerns Keep up the good work!  You look great! HAPPY RETIREMENT!!!

## 2014-06-01 NOTE — Assessment & Plan Note (Signed)
Pt's PE WNL.  UTD on pap, mammo, colonoscopy.  Check labs.  Anticipatory guidance provided.  

## 2014-06-01 NOTE — Progress Notes (Signed)
   Subjective:    Patient ID: Alexandra [name], female    DOB: Jul 16, 1951, 63 y.o.   MRN: [id number]  HPI CPE- UTD on pap, mammo, colonoscopy ([facility]).     Review of Systems Patient reports no vision/ hearing changes, adenopathy,fever, weight change,  persistant/recurrent hoarseness , swallowing issues, chest pain, palpitations, edema, persistant/recurrent cough, hemoptysis, dyspnea (rest/exertional/paroxysmal nocturnal), gastrointestinal bleeding (melena, rectal bleeding), abdominal pain, significant heartburn, bowel changes, GU symptoms (dysuria, hematuria, incontinence), Gyn symptoms (abnormal  bleeding, pain),  syncope, focal weakness, memory loss, numbness & tingling, skin/hair/nail changes, abnormal bruising or bleeding, anxiety, or depression.     Objective:   Physical Exam General Appearance:    Alert, cooperative, no distress, appears stated age  Head:    Normocephalic, without obvious abnormality, atraumatic  Eyes:    PERRL, conjunctiva/corneas clear, EOM's intact, fundi    benign, both eyes  Ears:    Normal TM's and external ear canals, both ears  Nose:   Nares normal, septum midline, mucosa normal, no drainage    or sinus tenderness  Throat:   Lips, mucosa, and tongue normal; teeth and gums normal  Neck:   Supple, symmetrical, trachea midline, no adenopathy;    Thyroid: no enlargement/tenderness/nodules  Back:     Symmetric, no curvature, ROM normal, no CVA tenderness  Lungs:     Clear to auscultation bilaterally, respirations unlabored  Chest Wall:    No tenderness or deformity   Heart:    Regular rate and rhythm, S1 and S2 normal, no murmur, rub   or gallop  Breast Exam:    Deferred to mammo  Abdomen:     Soft, non-tender, bowel sounds active all four quadrants,    no masses, no organomegaly  Genitalia:    Deferred  Rectal:    Extremities:   Extremities normal, atraumatic, no cyanosis or edema  Pulses:   2+ and symmetric all extremities  Skin:   Skin color, texture,  turgor normal, no rashes or lesions  Lymph nodes:   Cervical, supraclavicular, and axillary nodes normal  Neurologic:   CNII-XII intact, normal strength, sensation and reflexes    throughout          Assessment & Plan:

## 2014-06-01 NOTE — Progress Notes (Signed)
Pre visit review using our clinic review tool, if applicable. No additional management support is needed unless otherwise documented below in the visit note. 

## 2014-06-02 ENCOUNTER — Other Ambulatory Visit: Payer: Self-pay | Admitting: Family Medicine

## 2014-06-02 NOTE — Telephone Encounter (Signed)
Med filled.  

## 2014-09-28 ENCOUNTER — Other Ambulatory Visit: Payer: Self-pay

## 2014-09-28 DIAGNOSIS — Z1231 Encounter for screening mammogram for malignant neoplasm of breast: Secondary | ICD-10-CM

## 2014-11-09 ENCOUNTER — Ambulatory Visit
Admission: RE | Admit: 2014-11-09 | Discharge: 2014-11-09 | Disposition: A | Discharge status: Home / Self Care | Payer: BLUE CROSS/BLUE SHIELD | Source: Ambulatory Visit

## 2014-11-09 DIAGNOSIS — Z1231 Encounter for screening mammogram for malignant neoplasm of breast: Secondary | ICD-10-CM

## 2014-12-03 ENCOUNTER — Ambulatory Visit (INDEPENDENT_AMBULATORY_CARE_PROVIDER_SITE_OTHER): Payer: BLUE CROSS/BLUE SHIELD | Admitting: Family Medicine

## 2014-12-03 ENCOUNTER — Encounter: Payer: Self-pay | Admitting: Family Medicine

## 2014-12-03 VITALS — BP 124/78 | HR 72 | Temp 98.2°F | Resp 16 | Wt 181.4 lb

## 2014-12-03 DIAGNOSIS — E785 Hyperlipidemia, unspecified: Secondary | ICD-10-CM | POA: Diagnosis not present

## 2014-12-03 DIAGNOSIS — G4700 Insomnia, unspecified: Secondary | ICD-10-CM

## 2014-12-03 DIAGNOSIS — I10 Essential (primary) hypertension: Secondary | ICD-10-CM

## 2014-12-03 DIAGNOSIS — F32A Depression, unspecified: Secondary | ICD-10-CM

## 2014-12-03 DIAGNOSIS — F329 Major depressive disorder, single episode, unspecified: Secondary | ICD-10-CM

## 2014-12-03 LAB — CBC WITH DIFFERENTIAL/PLATELET
Basophils Absolute: 0 10*3/uL (ref 0.0–0.1)
Basophils Relative: 0.6 % (ref 0.0–3.0)
EOS PCT: 3.7 % (ref 0.0–5.0)
Eosinophils Absolute: 0.2 10*3/uL (ref 0.0–0.7)
HEMATOCRIT: 43.6 % (ref 36.0–46.0)
Hemoglobin: 15.1 g/dL — ABNORMAL HIGH (ref 12.0–15.0)
LYMPHS ABS: 1.7 10*3/uL (ref 0.7–4.0)
Lymphocytes Relative: 39.1 % (ref 12.0–46.0)
MCHC: 34.7 g/dL (ref 30.0–36.0)
MCV: 88 fl (ref 78.0–100.0)
Monocytes Absolute: 0.3 10*3/uL (ref 0.1–1.0)
Monocytes Relative: 7.5 % (ref 3.0–12.0)
NEUTROS ABS: 2.2 10*3/uL (ref 1.4–7.7)
Neutrophils Relative %: 49.1 % (ref 43.0–77.0)
PLATELETS: 255 10*3/uL (ref 150.0–400.0)
RBC: 4.95 Mil/uL (ref 3.87–5.11)
RDW: 12.8 % (ref 11.5–15.5)
WBC: 4.5 10*3/uL (ref 4.0–10.5)

## 2014-12-03 LAB — LIPID PANEL
CHOL/HDL RATIO: 3
CHOLESTEROL: 192 mg/dL (ref 0–200)
HDL: 66.1 mg/dL (ref >39.00)
LDL Cholesterol: 92 mg/dL (ref 0–99)
NonHDL: 125.9
TRIGLYCERIDES: 168 mg/dL — AB (ref 0.0–149.0)
VLDL: 33.6 mg/dL (ref 0.0–40.0)

## 2014-12-03 LAB — HEPATIC FUNCTION PANEL
ALBUMIN: 4.1 g/dL (ref 3.5–5.2)
ALK PHOS: 56 U/L (ref 39–117)
ALT: 25 U/L (ref 0–35)
AST: 27 U/L (ref 0–37)
BILIRUBIN DIRECT: 0.2 mg/dL (ref 0.0–0.3)
BILIRUBIN TOTAL: 0.9 mg/dL (ref 0.2–1.2)
Total Protein: 7.1 g/dL (ref 6.0–8.3)

## 2014-12-03 LAB — BASIC METABOLIC PANEL
BUN: 23 mg/dL (ref 6–23)
CALCIUM: 9.7 mg/dL (ref 8.4–10.5)
CHLORIDE: 103 meq/L (ref 96–112)
CO2: 28 meq/L (ref 19–32)
Creatinine, Ser: 0.81 mg/dL (ref 0.40–1.20)
GFR: 76.06 mL/min (ref >60.00)
Glucose, Bld: 102 mg/dL — ABNORMAL HIGH (ref 70–99)
Potassium: 3.8 mEq/L (ref 3.5–5.1)
SODIUM: 137 meq/L (ref 135–145)

## 2014-12-03 MED ORDER — HYDROCHLOROTHIAZIDE 12.5 MG PO TABS: 12.5000 mg | ORAL_TABLET | Freq: Every day | ORAL | Status: DC

## 2014-12-03 MED ORDER — VENLAFAXINE HCL ER 75 MG PO CP24: 75.0000 mg | ORAL_CAPSULE | Freq: Every day | ORAL | Status: DC

## 2014-12-03 MED ORDER — SIMVASTATIN 20 MG PO TABS: 20.0000 mg | ORAL_TABLET | Freq: Every day | ORAL | Status: DC

## 2014-12-03 MED ORDER — ZOLPIDEM TARTRATE ER 12.5 MG PO TBCR: 12.5000 mg | EXTENDED_RELEASE_TABLET | Freq: Every evening | ORAL | Status: DC | PRN

## 2014-12-03 NOTE — Patient Instructions (Signed)
Schedule your complete physical in 6 months We'll notify you of your lab results and make any changes if needed Keep up the good work!  You look great! Due to the side effects of the Venlafaxine, we'll decrease to 75mg .  If you find the mood isn't where you want it, or the side effects don't improve- call me! Call with any questions or concerns Happy Spring!!!

## 2014-12-03 NOTE — Assessment & Plan Note (Signed)
Improved since starting Effexor.  Refill on Ambien provided for prn use.

## 2014-12-03 NOTE — Assessment & Plan Note (Signed)
Improved since starting Effexor but having sexual dysfxn.  Due to this, will decrease back to 75mg  daily and see if side effects resolve w/o impacting mood too much.  If side effects persist or mood worsens, pt is to let me know.  Pt expressed understanding and is in agreement w/ plan.

## 2014-12-03 NOTE — Progress Notes (Signed)
   Subjective:    Patient ID: Alexandra [name], female    DOB: Jan 15, 1951, 64 y.o.   MRN: [id number]  HPI HTN- chronic problem, well controlled on HCTZ daily.  Pt reports SBP will occasionally rise to 135.  No CP, SOB, HAs, visual changes, edema.  Hyperlipidemia- chronic problem, on Simvastatin daily.  No abd pain, N/V, myalgias.  Insomnia- ongoing issue for pt.  Needs Ambien refill for PRN use.  Only taking 1/2 tab.  Depression- chronic problem.  Pt reports that she 'really likes' the Effexor but is having issues w/ sexual dysfunction- both decreased libido and anorgasmia.   Review of Systems For ROS see HPI     Objective:   Physical Exam  Constitutional: She is oriented to person, place, and time. She appears well-developed and well-nourished. No distress.  HENT:  Head: Normocephalic and atraumatic.  Eyes: Conjunctivae and EOM are normal. Pupils are equal, round, and reactive to light.  Neck: Normal range of motion. Neck supple. No thyromegaly present.  Cardiovascular: Normal rate, regular rhythm, normal heart sounds and intact distal pulses.   No murmur heard. Pulmonary/Chest: Effort normal and breath sounds normal. No respiratory distress.  Abdominal: Soft. She exhibits no distension. There is no tenderness.  Musculoskeletal: She exhibits no edema.  Lymphadenopathy:    She has no cervical adenopathy.  Neurological: She is alert and oriented to person, place, and time.  Skin: Skin is warm and dry.  Psychiatric: She has a normal mood and affect. Her behavior is normal.  Vitals reviewed.         Assessment & Plan:

## 2014-12-03 NOTE — Assessment & Plan Note (Signed)
Chronic problem.  Well controlled.  Asymptomatic.  Check labs.  No anticipated med changes. 

## 2014-12-03 NOTE — Assessment & Plan Note (Signed)
Chronic problem.  Tolerating statin w/o difficulty.  Check labs.  Adjust meds prn  

## 2014-12-03 NOTE — Progress Notes (Signed)
Pre visit review using our clinic review tool, if applicable. No additional management support is needed unless otherwise documented below in the visit note. 

## 2015-01-13 ENCOUNTER — Other Ambulatory Visit: Payer: Self-pay | Admitting: Family Medicine

## 2015-01-14 NOTE — Telephone Encounter (Signed)
Med filled.  

## 2015-02-12 ENCOUNTER — Other Ambulatory Visit: Payer: Self-pay | Admitting: General Practice

## 2015-02-12 DIAGNOSIS — E785 Hyperlipidemia, unspecified: Secondary | ICD-10-CM

## 2015-02-12 DIAGNOSIS — I10 Essential (primary) hypertension: Secondary | ICD-10-CM

## 2015-02-12 MED ORDER — SIMVASTATIN 20 MG PO TABS: 20.0000 mg | ORAL_TABLET | Freq: Every day | ORAL | Status: DC

## 2015-02-12 MED ORDER — HYDROCHLOROTHIAZIDE 12.5 MG PO TABS: 12.5000 mg | ORAL_TABLET | Freq: Every day | ORAL | Status: DC

## 2015-03-22 ENCOUNTER — Encounter: Payer: Self-pay | Admitting: Family Medicine

## 2015-03-22 ENCOUNTER — Ambulatory Visit (HOSPITAL_BASED_OUTPATIENT_CLINIC_OR_DEPARTMENT_OTHER)
Admission: RE | Admit: 2015-03-22 | Discharge: 2015-03-22 | Disposition: A | Discharge status: Home / Self Care | Payer: BLUE CROSS/BLUE SHIELD | Source: Ambulatory Visit | Attending: Family Medicine | Admitting: Family Medicine

## 2015-03-22 ENCOUNTER — Ambulatory Visit (INDEPENDENT_AMBULATORY_CARE_PROVIDER_SITE_OTHER): Payer: BLUE CROSS/BLUE SHIELD | Admitting: Family Medicine

## 2015-03-22 VITALS — BP 123/80 | HR 92 | Temp 98.0°F | Resp 16 | Wt 183.1 lb

## 2015-03-22 DIAGNOSIS — M79672 Pain in left foot: Secondary | ICD-10-CM | POA: Diagnosis present

## 2015-03-22 DIAGNOSIS — M7732 Calcaneal spur, left foot: Secondary | ICD-10-CM | POA: Insufficient documentation

## 2015-03-22 MED ORDER — MELOXICAM 15 MG PO TABS: 15.0000 mg | ORAL_TABLET | Freq: Every day | ORAL | Status: DC

## 2015-03-22 NOTE — Progress Notes (Signed)
Pre visit review using our clinic review tool, if applicable. No additional management support is needed unless otherwise documented below in the visit note. 

## 2015-03-22 NOTE — Patient Instructions (Signed)
Go downstairs and get your xray Wear the post-op shoe for at least the next 2 weeks for support and to allow healing Take the Mobic once daily- take w/ food for pain and inflammation ICE! If you take off the post-op shoe in 2 weeks and the pain returns, resume the shoe and call me so we can get additional imaging Call with any questions or concerns Hang in there!

## 2015-03-22 NOTE — Assessment & Plan Note (Signed)
New.  Pt has hx of stress fx on other foot.  Pt now w/ pain and swelling of L foot.  Check xray.  Pt placed in post-op shoe to allow healing and for support.  Start daily NSAIDs.  Reviewed supportive care and red flags that should prompt return.  Pt expressed understanding and is in agreement w/ plan.

## 2015-03-22 NOTE — Progress Notes (Signed)
   Subjective:    Patient ID: [name], female    DOB: 1950/09/10, 64 y.o.   MRN: [id number]  HPI Foot pain- L foot.  sxs started 'a couple of weeks ago'.  Pt thought she bruised it while doing cardio.  Pain is worsening.  Now swelling.  Hx of stress fx on the other foot.  No recent change in activity level.  Unable to bear weight on foot w/o shooting pain x3-4 days.   Review of Systems For ROS see HPI     Objective:   Physical Exam  Constitutional: She is oriented to person, place, and time. She appears well-developed and well-nourished. No distress.  Cardiovascular: Intact distal pulses.   Musculoskeletal: She exhibits edema and tenderness (TTP proximal to 2nd/3rd MCP joints w/ swelling).  Neurological: She is alert and oriented to person, place, and time.  Vitals reviewed.         Assessment & Plan:

## 2015-03-27 ENCOUNTER — Encounter: Payer: Self-pay | Admitting: Family Medicine

## 2015-05-03 ENCOUNTER — Encounter: Payer: Self-pay | Admitting: Gastroenterology

## 2015-06-10 ENCOUNTER — Ambulatory Visit (INDEPENDENT_AMBULATORY_CARE_PROVIDER_SITE_OTHER): Payer: BLUE CROSS/BLUE SHIELD | Admitting: Family Medicine

## 2015-06-10 ENCOUNTER — Encounter: Payer: Self-pay | Admitting: Family Medicine

## 2015-06-10 ENCOUNTER — Other Ambulatory Visit (HOSPITAL_COMMUNITY)
Admission: RE | Admit: 2015-06-10 | Discharge: 2015-06-10 | Disposition: A | Discharge status: Home / Self Care | Payer: BLUE CROSS/BLUE SHIELD | Source: Ambulatory Visit | Attending: Family Medicine | Admitting: Family Medicine

## 2015-06-10 ENCOUNTER — Other Ambulatory Visit: Payer: Self-pay | Admitting: General Practice

## 2015-06-10 VITALS — BP 122/82 | HR 80 | Temp 98.3°F | Resp 16 | Ht 63.0 in | Wt 186.0 lb

## 2015-06-10 DIAGNOSIS — R7989 Other specified abnormal findings of blood chemistry: Secondary | ICD-10-CM | POA: Diagnosis not present

## 2015-06-10 DIAGNOSIS — Z01419 Encounter for gynecological examination (general) (routine) without abnormal findings: Secondary | ICD-10-CM | POA: Diagnosis present

## 2015-06-10 DIAGNOSIS — Z23 Encounter for immunization: Secondary | ICD-10-CM | POA: Diagnosis not present

## 2015-06-10 DIAGNOSIS — Z8041 Family history of malignant neoplasm of ovary: Secondary | ICD-10-CM | POA: Diagnosis not present

## 2015-06-10 DIAGNOSIS — Z780 Asymptomatic menopausal state: Secondary | ICD-10-CM

## 2015-06-10 DIAGNOSIS — Z124 Encounter for screening for malignant neoplasm of cervix: Secondary | ICD-10-CM | POA: Diagnosis not present

## 2015-06-10 DIAGNOSIS — Z0000 Encounter for general adult medical examination without abnormal findings: Secondary | ICD-10-CM

## 2015-06-10 DIAGNOSIS — Z1151 Encounter for screening for human papillomavirus (HPV): Secondary | ICD-10-CM | POA: Insufficient documentation

## 2015-06-10 LAB — CBC WITH DIFFERENTIAL/PLATELET
Basophils Absolute: 0 10*3/uL (ref 0.0–0.1)
Basophils Relative: 0.6 % (ref 0.0–3.0)
EOS PCT: 2.5 % (ref 0.0–5.0)
Eosinophils Absolute: 0.2 10*3/uL (ref 0.0–0.7)
HEMATOCRIT: 44 % (ref 36.0–46.0)
Hemoglobin: 14.6 g/dL (ref 12.0–15.0)
LYMPHS ABS: 1.6 10*3/uL (ref 0.7–4.0)
Lymphocytes Relative: 26.4 % (ref 12.0–46.0)
MCHC: 33.2 g/dL (ref 30.0–36.0)
MCV: 90.9 fl (ref 78.0–100.0)
MONO ABS: 0.5 10*3/uL (ref 0.1–1.0)
MONOS PCT: 7.4 % (ref 3.0–12.0)
NEUTROS ABS: 3.9 10*3/uL (ref 1.4–7.7)
Neutrophils Relative %: 63.1 % (ref 43.0–77.0)
Platelets: 260 10*3/uL (ref 150.0–400.0)
RBC: 4.84 Mil/uL (ref 3.87–5.11)
RDW: 13.4 % (ref 11.5–15.5)
WBC: 6.2 10*3/uL (ref 4.0–10.5)

## 2015-06-10 LAB — VITAMIN D 25 HYDROXY (VIT D DEFICIENCY, FRACTURES): VITD: 34.89 ng/mL (ref 30.00–100.00)

## 2015-06-10 LAB — BASIC METABOLIC PANEL
BUN: 17 mg/dL (ref 6–23)
CHLORIDE: 103 meq/L (ref 96–112)
CO2: 30 mEq/L (ref 19–32)
Calcium: 10.2 mg/dL (ref 8.4–10.5)
Creatinine, Ser: 0.82 mg/dL (ref 0.40–1.20)
GFR: 74.86 mL/min (ref >60.00)
GLUCOSE: 101 mg/dL — AB (ref 70–99)
POTASSIUM: 4.5 meq/L (ref 3.5–5.1)
SODIUM: 143 meq/L (ref 135–145)

## 2015-06-10 LAB — HEPATIC FUNCTION PANEL
ALBUMIN: 3.9 g/dL (ref 3.5–5.2)
ALK PHOS: 50 U/L (ref 39–117)
ALT: 20 U/L (ref 0–35)
AST: 20 U/L (ref 0–37)
BILIRUBIN DIRECT: 0.1 mg/dL (ref 0.0–0.3)
TOTAL PROTEIN: 6.7 g/dL (ref 6.0–8.3)
Total Bilirubin: 1 mg/dL (ref 0.2–1.2)

## 2015-06-10 LAB — LIPID PANEL
Cholesterol: 221 mg/dL — ABNORMAL HIGH (ref 0–200)
HDL: 81.3 mg/dL (ref >39.00)
NonHDL: 140.03
Total CHOL/HDL Ratio: 3
Triglycerides: 274 mg/dL — ABNORMAL HIGH (ref 0.0–149.0)
VLDL: 54.8 mg/dL — ABNORMAL HIGH (ref 0.0–40.0)

## 2015-06-10 LAB — LDL CHOLESTEROL, DIRECT: Direct LDL: 116 mg/dL

## 2015-06-10 LAB — TSH: TSH: 2.41 u[IU]/mL (ref 0.35–4.50)

## 2015-06-10 MED ORDER — FENOFIBRATE 160 MG PO TABS: 160.0000 mg | ORAL_TABLET | Freq: Every day | ORAL | Status: DC

## 2015-06-10 NOTE — Progress Notes (Signed)
Pre visit review using our clinic review tool, if applicable. No additional management support is needed unless otherwise documented below in the visit note. 

## 2015-06-10 NOTE — Progress Notes (Signed)
   Subjective:    Patient ID: Alexandra [name], female    DOB: Mar 06, 1951, 64 y.o.   MRN: [id number]  HPI CPE- UTD on colonoscopy (due in 2020), UTD on mammo.  Due for pap, DEXA ([facility])  Mom dx'd w/ ovarian cancer recently at age 64   Review of Systems Patient reports no vision/ hearing changes, adenopathy,fever, weight change,  persistant/recurrent hoarseness , swallowing issues, chest pain, palpitations, edema, persistant/recurrent cough, hemoptysis, dyspnea (rest/exertional/paroxysmal nocturnal), gastrointestinal bleeding (melena, rectal bleeding), abdominal pain, significant heartburn, bowel changes, GU symptoms (dysuria, hematuria, incontinence), Gyn symptoms (abnormal  bleeding, pain),  syncope, focal weakness, memory loss, numbness & tingling, skin/hair/nail changes, abnormal bruising or bleeding, anxiety, or depression.     Objective:   Physical Exam  General Appearance:    Alert, cooperative, no distress, appears stated age  Head:    Normocephalic, without obvious abnormality, atraumatic  Eyes:    PERRL, conjunctiva/corneas clear, EOM's intact, fundi    benign, both eyes  Ears:    Normal TM's and external ear canals, both ears  Nose:   Nares normal, septum midline, mucosa normal, no drainage    or sinus tenderness  Throat:   Lips, mucosa, and tongue normal; teeth and gums normal  Neck:   Supple, symmetrical, trachea midline, no adenopathy;    Thyroid: no enlargement/tenderness/nodules  Back:     Symmetric, no curvature, ROM normal, no CVA tenderness  Lungs:     Clear to auscultation bilaterally, respirations unlabored  Chest Wall:    No tenderness or deformity   Heart:    Regular rate and rhythm, S1 and S2 normal, no murmur, rub   or gallop  Breast Exam:    No tenderness, masses, or nipple abnormality  Abdomen:     Soft, non-tender, bowel sounds active all four quadrants,    no masses, no organomegaly  Genitalia:    External genitalia normal, cervix normal in appearance,  no CMT, uterus in normal size and position, adnexa w/out mass or tenderness, mucosa pink and moist, no lesions or discharge present  Rectal:    Normal external appearance  Extremities:   Extremities normal, atraumatic, no cyanosis or edema  Pulses:   2+ and symmetric all extremities  Skin:   Skin color, texture, turgor normal, no rashes or lesions  Lymph nodes:   Cervical, supraclavicular, and axillary nodes normal  Neurologic:   CNII-XII intact, normal strength, sensation and reflexes    throughout          Assessment & Plan:

## 2015-06-10 NOTE — Patient Instructions (Signed)
Follow up in 6 months to recheck BP and cholesterol We'll notify you of your lab results and make any changes if needed We'll call you with your genetics appt and your bone density appt Keep up the good work on healthy diet and regular exercise- you look great! Call with any questions or concerns If you want to join [place] at the new [place] office, any scheduled appointments will automatically transfer and we will see you at [address] [place] [address] [place], [place], [place] [address]  Hang in there!  You will be in my thoughts and prayers!

## 2015-06-10 NOTE — Assessment & Plan Note (Signed)
Pt's PE WNL w/ exception of being overweight.  UTD on mammo and colonoscopy.  Due for DEXA- ordered.  Pap collected today.  Check labs.  Stressed need for ongoing healthy diet and regular exercise.

## 2015-06-10 NOTE — Assessment & Plan Note (Signed)
New.  Based on mom's recent dx, it was recommended that pt have BRCA testing.  Will refer to [facility] genetics team.

## 2015-06-10 NOTE — Assessment & Plan Note (Signed)
Pap collected. 

## 2015-06-11 ENCOUNTER — Encounter: Payer: Self-pay | Admitting: Family Medicine

## 2015-06-12 ENCOUNTER — Telehealth: Payer: Self-pay | Admitting: Genetic Counselor

## 2015-06-12 NOTE — Telephone Encounter (Signed)
[facility] FOR GENETIC COUNSELING

## 2015-06-14 LAB — CYTOLOGY - PAP

## 2015-07-02 ENCOUNTER — Encounter: Payer: Self-pay | Admitting: Family Medicine

## 2015-07-15 ENCOUNTER — Other Ambulatory Visit: Payer: BLUE CROSS/BLUE SHIELD

## 2015-07-15 ENCOUNTER — Encounter: Payer: BLUE CROSS/BLUE SHIELD | Admitting: Genetic Counselor

## 2015-07-22 ENCOUNTER — Encounter: Payer: BLUE CROSS/BLUE SHIELD | Admitting: Genetic Counselor

## 2015-07-22 ENCOUNTER — Other Ambulatory Visit: Payer: BLUE CROSS/BLUE SHIELD

## 2015-08-07 ENCOUNTER — Other Ambulatory Visit: Payer: BLUE CROSS/BLUE SHIELD

## 2015-08-07 ENCOUNTER — Ambulatory Visit (HOSPITAL_BASED_OUTPATIENT_CLINIC_OR_DEPARTMENT_OTHER): Payer: BLUE CROSS/BLUE SHIELD | Admitting: Genetic Counselor

## 2015-08-07 ENCOUNTER — Encounter: Payer: Self-pay | Admitting: Genetic Counselor

## 2015-08-07 DIAGNOSIS — Z8041 Family history of malignant neoplasm of ovary: Secondary | ICD-10-CM | POA: Diagnosis not present

## 2015-08-07 NOTE — Progress Notes (Signed)
REFERRING PROVIDER: [name], MD [address] [place], [place] [address]  PRIMARY PROVIDER:  Annye Asa, MD  PRIMARY REASON FOR VISIT:  1. Family history of ovarian cancer      HISTORY OF PRESENT ILLNESS:   Ms. Schmuhl, a 64 y.o. female, was seen for a [facility] cancer genetics consultation at the request of Dr. Birdie Riddle due to a family history of cancer.  Ms. Markwell presents to clinic today to discuss the possibility of a hereditary predisposition to cancer, genetic testing, and to further clarify her future cancer risks, as well as potential cancer risks for family members. Ms. Savoia is a 64 y.o. female with no personal history of cancer.  Her mother passed away about 2 weeks ago, before she could undergo genetic testing.    CANCER HISTORY:   No history exists.     HORMONAL RISK FACTORS:  Menarche was at age 18.  First live birth at age 35.  OCP use for approximately 1 years.  Ovaries intact: yes.  Hysterectomy: no.  Menopausal status: postmenopausal.  HRT use: 0 years. Colonoscopy: yes; normal. Mammogram within the last year: yes. Number of breast biopsies: 0. Up to date with pelvic exams:  yes. Any excessive radiation exposure in the past:  no  Past Medical History  Diagnosis Date  . Depression   . Hyperlipidemia   . Hypertension   . Glaucoma   . Family history of ovarian cancer     Past Surgical History  Procedure Laterality Date  . Dermoid cyst  excision      Social History   Social History  . Marital Status: Married    Spouse Name: N/A  . Number of Children: 2  . Years of Education: N/A   Social History Main Topics  . Smoking status: Never Smoker   . Smokeless tobacco: None  . Alcohol Use: Yes  . Drug Use: None  . Sexual Activity: Not Asked   Other Topics Concern  . None   Social History Narrative     FAMILY HISTORY:  We obtained a detailed, 4-generation family history.  Significant diagnoses are listed below: Family  History  Problem Relation Age of Onset  . Heart attack Father   . Lung cancer Sister 23    smoker  . Throat cancer Brother 38    smoker  . Ovarian cancer Mother 27  . Esophageal cancer Maternal Uncle   . Lung cancer Maternal Grandfather     dx in his 63s  . Hodgkin's lymphoma Other 16    The patient has two boys who are healthy.  She has two sisters and three brothers.  One brother died from throat cancer and one sister died from lung cancer.  Her brother with throat cancer ha a sone who was diagnosed with Hodgkin's Lymphoma at 40.  The patient's mother had a BSO at 87, and was found at age 42 to have peritoneal cancer.  She hd three sisters and four brothers. One brother had esophageal cancer.  The patient's father was an only child.  There is no other reported cancer history.  Patient's maternal ancestors are of Irish/German descent, and paternal ancestors are of Caucasian descent. There is no reported Ashkenazi Jewish ancestry. There is no known consanguinity.  GENETIC COUNSELING ASSESSMENT: IVORY MADURO is a 64 y.o. female with a family history of ovarian cancer which is somewhat suggestive of a hereditary cancer sydnrome and predisposition to cancer. We, therefore, discussed and recommended  the following at today's visit.   DISCUSSION: We reviewed the characteristics, features and inheritance patterns of hereditary cancer syndromes. We also discussed genetic testing, including the appropriate family members to test, the process of testing, insurance coverage and turn-around-time for results. We discussed the implications of a negative, positive and/or variant of uncertain significant result. We recommended Ms. Mareno pursue genetic testing for the Breast/Ovarian cancer gene panel. The Breast/Ovarian gene panel offered by [facility] includes sequencing and rearrangement analysis for the following 20 genes:  ATM, BARD1, BRCA1, BRCA2, BRIP1, CDH1, CHEK2, EPCAM, FANCC, MLH1, MSH2, MSH6, NBN, PALB2,  PMS2, PTEN, RAD51C, RAD51D, TP53, and XRCC2.     Based on Ms. 2 family history of cancer, she meets medical criteria for genetic testing. Despite that she meets criteria, she may still have an out of pocket cost. We discussed that if her out of pocket cost for testing is over $100, the laboratory will call and confirm whether she wants to proceed with testing.  If the out of pocket cost of testing is less than $100 she will be billed by the genetic testing laboratory.   PLAN: After considering the risks, benefits, and limitations, Ms. Pe  provided informed consent to pursue genetic testing and the blood sample was sent to [facility] for analysis of the Breast/Ovarian cancer. Results should be available within approximately 2-3 weeks' time, at which point they will be disclosed by telephone to Ms. Crosby, as will any additional recommendations warranted by these results. Ms. Craghead will receive a summary of her genetic counseling visit and a copy of her results once available. This information will also be available in Epic. We encouraged Ms. Deremer to remain in contact with cancer genetics annually so that we can continuously update the family history and inform her of any changes in cancer genetics and testing that may be of benefit for her family. Ms. Bufkin questions were answered to her satisfaction today. Our contact information was provided should additional questions or concerns arise.  Lastly, we encouraged Ms. Pulley to remain in contact with cancer genetics annually so that we can continuously update the family history and inform her of any changes in cancer genetics and testing that may be of benefit for this family.   Ms.  Deininger questions were answered to her satisfaction today. Our contact information was provided should additional questions or concerns arise. Thank you for the referral and allowing [place] to share in the care of your patient.   Elberta Lachapelle P. Florene Glen, [place], [facility] Certified Genetic  Counselor [email or web address] phone: [phone]  The patient was seen for a total of 30 minutes in face-to-face genetic counseling.  This patient was discussed with Drs. Magrinat, Lindi Adie and/or Burr Medico who agrees with the above.    _______________________________________________________________________ For Office Staff:  Number of people involved in session: 1 Was an Intern/ student involved with case: no

## 2015-09-02 ENCOUNTER — Telehealth: Payer: Self-pay | Admitting: Genetic Counselor

## 2015-09-02 ENCOUNTER — Encounter: Payer: Self-pay | Admitting: Genetic Counselor

## 2015-09-02 DIAGNOSIS — Z1379 Encounter for other screening for genetic and chromosomal anomalies: Secondary | ICD-10-CM | POA: Insufficient documentation

## 2015-09-02 NOTE — Telephone Encounter (Signed)
Negative genetic testing on the Breast/Ovarian cancer panel

## 2015-09-03 ENCOUNTER — Ambulatory Visit: Payer: Self-pay | Admitting: Genetic Counselor

## 2015-09-03 DIAGNOSIS — Z8041 Family history of malignant neoplasm of ovary: Secondary | ICD-10-CM

## 2015-09-03 DIAGNOSIS — Z1379 Encounter for other screening for genetic and chromosomal anomalies: Secondary | ICD-10-CM

## 2015-09-03 NOTE — Progress Notes (Signed)
HPI: Ms. Sparano was previously seen in the [facility] clinic due to a family history of cancer and concerns regarding a hereditary predisposition to cancer. Please refer to our prior cancer genetics clinic note for more information regarding Ms. Tripodi's medical, social and family histories, and our assessment and recommendations, at the time. Ms. Sheriff recent genetic test results were disclosed to her, as were recommendations warranted by these results. These results and recommendations are discussed in more detail below.  FAMILY HISTORY:  We obtained a detailed, 4-generation family history.  Significant diagnoses are listed below: Family History  Problem Relation Age of Onset  . Heart attack Father   . Lung cancer Sister 38    smoker  . Throat cancer Brother 33    smoker  . Ovarian cancer Mother 55  . Esophageal cancer Maternal Uncle   . Lung cancer Maternal Grandfather     dx in his 56s  . Hodgkin's lymphoma Other 16    The patient has two boys who are healthy. She has two sisters and three brothers. One brother died from throat cancer and one sister died from lung cancer. Her brother with throat cancer ha a sone who was diagnosed with Hodgkin's Lymphoma at 70. The patient's mother had a BSO at 31, and was found at age 54 to have peritoneal cancer. She hd three sisters and four brothers. One brother had esophageal cancer. The patient's father was an only child. There is no other reported cancer history. Patient's maternal ancestors are of Irish/German descent, and paternal ancestors are of Caucasian descent. There is no reported Ashkenazi Jewish ancestry. There is no known consanguinity.  GENETIC TEST RESULTS: At the time of Ms. Peckinpaugh's visit, we recommended she pursue genetic testing of the Breast/Ovarian cancer gene panel. The Breast/Ovarian gene panel offered by [facility] includes sequencing and rearrangement analysis for the following 20 genes:  ATM, BARD1, BRCA1, BRCA2,  BRIP1, CDH1, CHEK2, EPCAM, FANCC, MLH1, MSH2, MSH6, NBN, PALB2, PMS2, PTEN, RAD51C, RAD51D, TP53, and XRCC2.   The report date is August 31, 2015.  Genetic testing was normal, and did not reveal a deleterious mutation in these genes. The test report has been scanned into EPIC and is located under the Molecular Pathology section of the Results Review tab.   We discussed with Ms. Riede that since the current genetic testing is not perfect, it is possible there may be a gene mutation in one of these genes that current testing cannot detect, but that chance is small. We also discussed, that it is possible that another gene that has not yet been discovered, or that we have not yet tested, is responsible for the cancer diagnoses in the family, and it is, therefore, important to remain in touch with cancer genetics in the future so that we can continue to offer Ms. Wimer the most up to date genetic testing.   CANCER SCREENING RECOMMENDATIONS: This normal result is reassuring and indicates that Ms. Fryberger does not likely have an increased risk of cancer due to a mutation in one of these genes.  We, therefore, recommended  Ms. Larocca continue to follow the cancer screening guidelines provided by her primary healthcare providers.   RECOMMENDATIONS FOR FAMILY MEMBERS: Women in this family might be at some increased risk of developing cancer, over the general population risk, simply due to the family history of cancer. We recommended women in this family have a yearly mammogram beginning at age 82, or 45 years younger than the  earliest onset of cancer, an an annual clinical breast exam, and perform monthly breast self-exams. Women in this family should also have a gynecological exam as recommended by their primary provider. All family members should have a colonoscopy by age 38.  This test does not explain Ms. 72 family history of cancer.  Possibly there is a cancer syndrome running in the family, but Ms. Forgione did  not inherit the syndrome.  Based on Ms. 86 family history, we recommended her sister, have genetic counseling and testing. Ms. Tawney will let us know if we can be of any assistance in coordinating genetic counseling and/or testing for this family member.   FOLLOW-UP: Lastly, we discussed with Ms. Zuver that cancer genetics is a rapidly advancing field and it is possible that new genetic tests will be appropriate for her and/or her family members in the future. We encouraged her to remain in contact with cancer genetics on an annual basis so we can update her personal and family histories and let her know of advances in cancer genetics that may benefit this family.   Our contact number was provided. Ms. Mims questions were answered to her satisfaction, and she knows she is welcome to call us at anytime with additional questions or concerns.   Roma Kayser, MS, [facility] Certified Genetic Counselor [email or web address]

## 2015-09-12 ENCOUNTER — Other Ambulatory Visit: Payer: Self-pay

## 2015-09-12 DIAGNOSIS — Z1231 Encounter for screening mammogram for malignant neoplasm of breast: Secondary | ICD-10-CM

## 2015-10-07 ENCOUNTER — Encounter: Payer: Self-pay | Admitting: Family Medicine

## 2015-10-07 MED ORDER — ZOLPIDEM TARTRATE ER 12.5 MG PO TBCR: 12.5000 mg | EXTENDED_RELEASE_TABLET | Freq: Every evening | ORAL | Status: DC | PRN

## 2015-10-07 NOTE — Telephone Encounter (Signed)
Last OV 06/10/15 ambien last filled 12/03/14 #30 with 3

## 2015-10-07 NOTE — Telephone Encounter (Signed)
Medication filled to pharmacy as requested.   

## 2015-10-12 ENCOUNTER — Other Ambulatory Visit: Payer: Self-pay | Admitting: Family Medicine

## 2015-10-14 NOTE — Telephone Encounter (Signed)
Medication filled to pharmacy as requested.   

## 2015-10-24 ENCOUNTER — Encounter: Payer: Self-pay | Admitting: Family Medicine

## 2015-11-11 ENCOUNTER — Ambulatory Visit
Admission: RE | Admit: 2015-11-11 | Discharge: 2015-11-11 | Disposition: A | Discharge status: Home / Self Care | Payer: BLUE CROSS/BLUE SHIELD | Source: Ambulatory Visit

## 2015-11-11 DIAGNOSIS — Z1231 Encounter for screening mammogram for malignant neoplasm of breast: Secondary | ICD-10-CM

## 2015-12-09 ENCOUNTER — Ambulatory Visit (INDEPENDENT_AMBULATORY_CARE_PROVIDER_SITE_OTHER): Payer: BLUE CROSS/BLUE SHIELD | Admitting: Family Medicine

## 2015-12-09 ENCOUNTER — Encounter: Payer: Self-pay | Admitting: Family Medicine

## 2015-12-09 VITALS — BP 128/84 | HR 64 | Temp 98.5°F | Resp 17 | Ht 63.0 in | Wt 184.0 lb

## 2015-12-09 DIAGNOSIS — F329 Major depressive disorder, single episode, unspecified: Secondary | ICD-10-CM

## 2015-12-09 DIAGNOSIS — E785 Hyperlipidemia, unspecified: Secondary | ICD-10-CM

## 2015-12-09 DIAGNOSIS — F32A Depression, unspecified: Secondary | ICD-10-CM

## 2015-12-09 DIAGNOSIS — I10 Essential (primary) hypertension: Secondary | ICD-10-CM

## 2015-12-09 LAB — LIPID PANEL
CHOL/HDL RATIO: 3
CHOLESTEROL: 210 mg/dL — AB (ref 0–200)
HDL: 79.5 mg/dL (ref >39.00)
NonHDL: 130.47
TRIGLYCERIDES: 217 mg/dL — AB (ref 0.0–149.0)
VLDL: 43.4 mg/dL — AB (ref 0.0–40.0)

## 2015-12-09 LAB — CBC WITH DIFFERENTIAL/PLATELET
BASOS ABS: 0 10*3/uL (ref 0.0–0.1)
Basophils Relative: 0.6 % (ref 0.0–3.0)
EOS PCT: 3.2 % (ref 0.0–5.0)
Eosinophils Absolute: 0.2 10*3/uL (ref 0.0–0.7)
HCT: 42.5 % (ref 36.0–46.0)
HEMOGLOBIN: 14.4 g/dL (ref 12.0–15.0)
LYMPHS ABS: 1.6 10*3/uL (ref 0.7–4.0)
Lymphocytes Relative: 34.4 % (ref 12.0–46.0)
MCHC: 34 g/dL (ref 30.0–36.0)
MCV: 89.3 fl (ref 78.0–100.0)
MONO ABS: 0.3 10*3/uL (ref 0.1–1.0)
MONOS PCT: 7.1 % (ref 3.0–12.0)
NEUTROS PCT: 54.7 % (ref 43.0–77.0)
Neutro Abs: 2.5 10*3/uL (ref 1.4–7.7)
Platelets: 262 10*3/uL (ref 150.0–400.0)
RBC: 4.75 Mil/uL (ref 3.87–5.11)
RDW: 13.7 % (ref 11.5–15.5)
WBC: 4.7 10*3/uL (ref 4.0–10.5)

## 2015-12-09 LAB — BASIC METABOLIC PANEL
BUN: 21 mg/dL (ref 6–23)
CO2: 25 mEq/L (ref 19–32)
CREATININE: 0.77 mg/dL (ref 0.40–1.20)
Calcium: 9.7 mg/dL (ref 8.4–10.5)
Chloride: 104 mEq/L (ref 96–112)
GFR: 80.37 mL/min (ref >60.00)
GLUCOSE: 103 mg/dL — AB (ref 70–99)
POTASSIUM: 3.8 meq/L (ref 3.5–5.1)
Sodium: 139 mEq/L (ref 135–145)

## 2015-12-09 LAB — TSH: TSH: 1.75 u[IU]/mL (ref 0.35–4.50)

## 2015-12-09 LAB — HEPATIC FUNCTION PANEL
ALBUMIN: 4.2 g/dL (ref 3.5–5.2)
ALT: 15 U/L (ref 0–35)
AST: 20 U/L (ref 0–37)
Alkaline Phosphatase: 36 U/L — ABNORMAL LOW (ref 39–117)
BILIRUBIN TOTAL: 0.9 mg/dL (ref 0.2–1.2)
Bilirubin, Direct: 0.2 mg/dL (ref 0.0–0.3)
Total Protein: 6.7 g/dL (ref 6.0–8.3)

## 2015-12-09 LAB — LDL CHOLESTEROL, DIRECT: Direct LDL: 107 mg/dL

## 2015-12-09 MED ORDER — VENLAFAXINE HCL ER 37.5 MG PO CP24: 37.5000 mg | ORAL_CAPSULE | Freq: Every day | ORAL | Status: DC

## 2015-12-09 NOTE — Progress Notes (Signed)
   Subjective:    Patient ID: Alexandra [name], female    DOB: 09-08-1950, 65 y.o.   MRN: [id number]  HPI HTN- chronic problem, on HCTZ daily w/ adequate BP control.  No CP, SOB, HAs, visual changes, edema  Hyperlipidemia- chronic problem, on Simvastatin.  Did not start Fenofibrate after last visit.  Exercising regularly- at least 5x/week + walking.  Denies abd pain, N/V.    Depression- chronic problem, interested in weaning Effexor.  Retired 1 yr ago and feels that things are improving.  Pt feels that mood is in a good place despite mother passing in December.   Review of Systems For ROS see HPI     Objective:   Physical Exam  Constitutional: She is oriented to person, place, and time. She appears well-developed and well-nourished. No distress.  HENT:  Head: Normocephalic and atraumatic.  Eyes: Conjunctivae and EOM are normal. Pupils are equal, round, and reactive to light.  Neck: Normal range of motion. Neck supple. No thyromegaly present.  Cardiovascular: Normal rate, regular rhythm, normal heart sounds and intact distal pulses.   No murmur heard. Pulmonary/Chest: Effort normal and breath sounds normal. No respiratory distress.  Abdominal: Soft. She exhibits no distension. There is no tenderness.  Musculoskeletal: She exhibits no edema.  Lymphadenopathy:    She has no cervical adenopathy.  Neurological: She is alert and oriented to person, place, and time.  Skin: Skin is warm and dry.  Psychiatric: She has a normal mood and affect. Her behavior is normal.  Vitals reviewed.         Assessment & Plan:

## 2015-12-09 NOTE — Assessment & Plan Note (Signed)
Ongoing issue for pt but she feels she is currently in a better place as she comes to terms w/ mom's death.  Interested in weaning Effexor if possible.  Weaning plan outlined for her (see instructions).  Pt is aware that at any point in the plan, she can go back up if needed.  Will follow.

## 2015-12-09 NOTE — Assessment & Plan Note (Signed)
Chronic problem.  Adequate control on HCTZ daily.  Check labs.  No anticipated med changes.

## 2015-12-09 NOTE — Progress Notes (Signed)
Pre visit review using our clinic review tool, if applicable. No additional management support is needed unless otherwise documented below in the visit note. 

## 2015-12-09 NOTE — Patient Instructions (Signed)
Schedule your complete physical in 6 months We'll notify you of your lab results and make any changes if needed Keep up the good work on healthy diet and regular exercise- you look great! Decrease the Effexor to 37.5mg  daily x1-2 months and then decrease to every other day for ~1 month.  Then decrease to twice weekly and then stop. Call with any questions or concerns Thanks for sticking with [place]!! Happy Spring!!!

## 2015-12-09 NOTE — Assessment & Plan Note (Signed)
Chronic problem.  Tolerating statin w/o difficulty.  Did not start Fenofibrate- preferred to work on healthy diet and regular exercise.  Check labs.  Adjust meds prn.

## 2015-12-11 ENCOUNTER — Ambulatory Visit
Admission: RE | Admit: 2015-12-11 | Discharge: 2015-12-11 | Disposition: A | Discharge status: Home / Self Care | Payer: BLUE CROSS/BLUE SHIELD | Source: Ambulatory Visit | Attending: Family Medicine | Admitting: Family Medicine

## 2015-12-11 DIAGNOSIS — Z780 Asymptomatic menopausal state: Secondary | ICD-10-CM

## 2015-12-20 ENCOUNTER — Other Ambulatory Visit: Payer: Self-pay | Admitting: Family Medicine

## 2015-12-20 NOTE — Telephone Encounter (Signed)
Medication filled to pharmacy as requested.   

## 2016-02-06 ENCOUNTER — Encounter: Payer: Self-pay | Admitting: Family Medicine

## 2016-02-13 ENCOUNTER — Other Ambulatory Visit: Payer: Self-pay | Admitting: Family Medicine

## 2016-02-13 NOTE — Telephone Encounter (Signed)
Last OV 12/09/15 meloxicam last filled 03/22/15 #30 with 1

## 2016-04-14 ENCOUNTER — Other Ambulatory Visit: Payer: Self-pay | Admitting: Family Medicine

## 2016-06-17 ENCOUNTER — Ambulatory Visit (INDEPENDENT_AMBULATORY_CARE_PROVIDER_SITE_OTHER): Payer: PRIVATE HEALTH INSURANCE | Admitting: Family Medicine

## 2016-06-17 ENCOUNTER — Encounter: Payer: Self-pay | Admitting: Family Medicine

## 2016-06-17 ENCOUNTER — Other Ambulatory Visit: Payer: Self-pay | Admitting: General Practice

## 2016-06-17 VITALS — BP 121/76 | HR 72 | Temp 98.6°F | Resp 16 | Ht 63.0 in | Wt 187.2 lb

## 2016-06-17 DIAGNOSIS — Z0000 Encounter for general adult medical examination without abnormal findings: Secondary | ICD-10-CM

## 2016-06-17 DIAGNOSIS — Z23 Encounter for immunization: Secondary | ICD-10-CM | POA: Diagnosis not present

## 2016-06-17 LAB — CBC WITH DIFFERENTIAL/PLATELET
BASOS PCT: 0.6 % (ref 0.0–3.0)
Basophils Absolute: 0 10*3/uL (ref 0.0–0.1)
EOS PCT: 2.6 % (ref 0.0–5.0)
Eosinophils Absolute: 0.1 10*3/uL (ref 0.0–0.7)
HEMATOCRIT: 44.1 % (ref 36.0–46.0)
Hemoglobin: 14.9 g/dL (ref 12.0–15.0)
LYMPHS PCT: 38.2 % (ref 12.0–46.0)
Lymphs Abs: 1.9 10*3/uL (ref 0.7–4.0)
MCHC: 33.8 g/dL (ref 30.0–36.0)
MCV: 88.6 fl (ref 78.0–100.0)
MONOS PCT: 9.9 % (ref 3.0–12.0)
Monocytes Absolute: 0.5 10*3/uL (ref 0.1–1.0)
NEUTROS ABS: 2.4 10*3/uL (ref 1.4–7.7)
Neutrophils Relative %: 48.7 % (ref 43.0–77.0)
PLATELETS: 258 10*3/uL (ref 150.0–400.0)
RBC: 4.98 Mil/uL (ref 3.87–5.11)
RDW: 13.3 % (ref 11.5–15.5)
WBC: 4.9 10*3/uL (ref 4.0–10.5)

## 2016-06-17 LAB — HEPATIC FUNCTION PANEL
ALT: 16 U/L (ref 0–35)
AST: 18 U/L (ref 0–37)
Albumin: 4.3 g/dL (ref 3.5–5.2)
Alkaline Phosphatase: 43 U/L (ref 39–117)
BILIRUBIN DIRECT: 0.3 mg/dL (ref 0.0–0.3)
BILIRUBIN TOTAL: 1.9 mg/dL — AB (ref 0.2–1.2)
Total Protein: 6.6 g/dL (ref 6.0–8.3)

## 2016-06-17 LAB — LIPID PANEL
CHOLESTEROL: 209 mg/dL — AB (ref 0–200)
HDL: 73.4 mg/dL (ref >39.00)
LDL Cholesterol: 96 mg/dL (ref 0–99)
NonHDL: 135.77
TRIGLYCERIDES: 197 mg/dL — AB (ref 0.0–149.0)
Total CHOL/HDL Ratio: 3
VLDL: 39.4 mg/dL (ref 0.0–40.0)

## 2016-06-17 LAB — BASIC METABOLIC PANEL
BUN: 25 mg/dL — AB (ref 6–23)
CHLORIDE: 105 meq/L (ref 96–112)
CO2: 29 mEq/L (ref 19–32)
Calcium: 9.7 mg/dL (ref 8.4–10.5)
Creatinine, Ser: 0.84 mg/dL (ref 0.40–1.20)
GFR: 72.57 mL/min (ref >60.00)
GLUCOSE: 98 mg/dL (ref 70–99)
POTASSIUM: 4.2 meq/L (ref 3.5–5.1)
Sodium: 141 mEq/L (ref 135–145)

## 2016-06-17 LAB — TSH: TSH: 1.77 u[IU]/mL (ref 0.35–4.50)

## 2016-06-17 LAB — VITAMIN D 25 HYDROXY (VIT D DEFICIENCY, FRACTURES): VITD: 36.96 ng/mL (ref 30.00–100.00)

## 2016-06-17 MED ORDER — RANITIDINE HCL 300 MG PO TABS: 300.0000 mg | ORAL_TABLET | Freq: Every day | ORAL | 6 refills | Status: DC

## 2016-06-17 NOTE — Assessment & Plan Note (Signed)
Pt's PE WNL.  UTD on pap, mammo, colonoscopy.  Flu shot given.  Check labs.  Stressed need for healthy diet and regular exercise.  Anticipatory guidance provided.

## 2016-06-17 NOTE — Progress Notes (Signed)
Pre visit review using our clinic review tool, if applicable. No additional management support is needed unless otherwise documented below in the visit note. 

## 2016-06-17 NOTE — Progress Notes (Signed)
   Subjective:    Patient ID: Alexandra [name], female    DOB: 1951-07-19, 65 y.o.   MRN: [id number]  HPI CPE- UTD on mammo, pap, colonoscopy (due 2020).  Pt to get flu today.  No concerns today   Review of Systems Patient reports no vision/ hearing changes, adenopathy,fever, weight change,  persistant/recurrent hoarseness , swallowing issues, chest pain, palpitations, edema, persistant/recurrent cough, hemoptysis, dyspnea (rest/exertional/paroxysmal nocturnal), gastrointestinal bleeding (melena, rectal bleeding), abdominal pain, bowel changes, GU symptoms (dysuria, hematuria, incontinence), Gyn symptoms (abnormal  bleeding, pain),  syncope, focal weakness, memory loss, numbness & tingling, skin/hair/nail changes, abnormal bruising or bleeding, anxiety, or depression.   +GERD    Objective:   Physical Exam General Appearance:    Alert, cooperative, no distress, appears stated age  Head:    Normocephalic, without obvious abnormality, atraumatic  Eyes:    PERRL, conjunctiva/corneas clear, EOM's intact, fundi    benign, both eyes  Ears:    Normal TM's and external ear canals, both ears  Nose:   Nares normal, septum midline, mucosa normal, no drainage    or sinus tenderness  Throat:   Lips, mucosa, and tongue normal; teeth and gums normal  Neck:   Supple, symmetrical, trachea midline, no adenopathy;    Thyroid: no enlargement/tenderness/nodules  Back:     Symmetric, no curvature, ROM normal, no CVA tenderness  Lungs:     Clear to auscultation bilaterally, respirations unlabored  Chest Wall:    No tenderness or deformity   Heart:    Regular rate and rhythm, S1 and S2 normal, no murmur, rub   or gallop  Breast Exam:    Deferred to mammo  Abdomen:     Soft, non-tender, bowel sounds active all four quadrants,    no masses, no organomegaly  Genitalia:    Deferred  Rectal:    Extremities:   Extremities normal, atraumatic, no cyanosis or edema  Pulses:   2+ and symmetric all extremities  Skin:    Skin color, texture, turgor normal, no rashes or lesions  Lymph nodes:   Cervical, supraclavicular, and axillary nodes normal  Neurologic:   CNII-XII intact, normal strength, sensation and reflexes    throughout          Assessment & Plan:

## 2016-06-17 NOTE — Patient Instructions (Signed)
Follow up in 6 months to recheck BP and cholesterol We'll notify you of your lab results and make any changes if needed Continue to work on healthy diet and regular exercise- you can do it!!! Start the Ranitidine nightly to decrease heartburn Call with any questions or concerns Happy Holidays!!!

## 2016-06-18 ENCOUNTER — Encounter: Payer: Self-pay | Admitting: Family Medicine

## 2016-06-29 ENCOUNTER — Other Ambulatory Visit: Payer: Self-pay | Admitting: Family Medicine

## 2016-07-29 ENCOUNTER — Encounter: Payer: Self-pay | Admitting: Family Medicine

## 2016-07-29 MED ORDER — HYDROCHLOROTHIAZIDE 12.5 MG PO TABS: 12.5000 mg | ORAL_TABLET | Freq: Every day | ORAL | 1 refills | Status: DC

## 2016-07-29 MED ORDER — SIMVASTATIN 20 MG PO TABS: 20.0000 mg | ORAL_TABLET | Freq: Every day | ORAL | 1 refills | Status: DC

## 2016-10-20 ENCOUNTER — Other Ambulatory Visit: Payer: Self-pay | Admitting: Family Medicine

## 2016-10-20 DIAGNOSIS — Z1231 Encounter for screening mammogram for malignant neoplasm of breast: Secondary | ICD-10-CM

## 2016-11-11 ENCOUNTER — Ambulatory Visit
Admission: RE | Admit: 2016-11-11 | Discharge: 2016-11-11 | Disposition: A | Discharge status: Home / Self Care | Payer: PRIVATE HEALTH INSURANCE | Source: Ambulatory Visit | Attending: Family Medicine | Admitting: Family Medicine

## 2016-11-11 DIAGNOSIS — Z1231 Encounter for screening mammogram for malignant neoplasm of breast: Secondary | ICD-10-CM

## 2016-12-15 ENCOUNTER — Ambulatory Visit (INDEPENDENT_AMBULATORY_CARE_PROVIDER_SITE_OTHER): Payer: PRIVATE HEALTH INSURANCE | Admitting: Family Medicine

## 2016-12-15 ENCOUNTER — Encounter: Payer: Self-pay | Admitting: Family Medicine

## 2016-12-15 VITALS — BP 110/74 | HR 69 | Temp 98.1°F | Resp 16 | Ht 63.0 in | Wt 182.4 lb

## 2016-12-15 DIAGNOSIS — I10 Essential (primary) hypertension: Secondary | ICD-10-CM

## 2016-12-15 DIAGNOSIS — Z23 Encounter for immunization: Secondary | ICD-10-CM

## 2016-12-15 DIAGNOSIS — E785 Hyperlipidemia, unspecified: Secondary | ICD-10-CM

## 2016-12-15 LAB — HEPATIC FUNCTION PANEL
ALT: 15 U/L (ref 0–35)
AST: 17 U/L (ref 0–37)
Albumin: 4.3 g/dL (ref 3.5–5.2)
Alkaline Phosphatase: 44 U/L (ref 39–117)
BILIRUBIN TOTAL: 1.4 mg/dL — AB (ref 0.2–1.2)
Bilirubin, Direct: 0.2 mg/dL (ref 0.0–0.3)
Total Protein: 6.5 g/dL (ref 6.0–8.3)

## 2016-12-15 LAB — CBC WITH DIFFERENTIAL/PLATELET
BASOS PCT: 0.6 % (ref 0.0–3.0)
Basophils Absolute: 0 10*3/uL (ref 0.0–0.1)
EOS ABS: 0.1 10*3/uL (ref 0.0–0.7)
EOS PCT: 2.6 % (ref 0.0–5.0)
HEMATOCRIT: 44.5 % (ref 36.0–46.0)
HEMOGLOBIN: 15 g/dL (ref 12.0–15.0)
LYMPHS PCT: 36.5 % (ref 12.0–46.0)
Lymphs Abs: 1.5 10*3/uL (ref 0.7–4.0)
MCHC: 33.7 g/dL (ref 30.0–36.0)
MCV: 90.3 fl (ref 78.0–100.0)
Monocytes Absolute: 0.4 10*3/uL (ref 0.1–1.0)
Monocytes Relative: 10.1 % (ref 3.0–12.0)
Neutro Abs: 2.1 10*3/uL (ref 1.4–7.7)
Neutrophils Relative %: 50.2 % (ref 43.0–77.0)
Platelets: 247 10*3/uL (ref 150.0–400.0)
RBC: 4.92 Mil/uL (ref 3.87–5.11)
RDW: 13.3 % (ref 11.5–15.5)
WBC: 4.2 10*3/uL (ref 4.0–10.5)

## 2016-12-15 LAB — BASIC METABOLIC PANEL
BUN: 19 mg/dL (ref 6–23)
CO2: 29 mEq/L (ref 19–32)
CREATININE: 0.81 mg/dL (ref 0.40–1.20)
Calcium: 9.6 mg/dL (ref 8.4–10.5)
Chloride: 105 mEq/L (ref 96–112)
GFR: 75.56 mL/min (ref >60.00)
Glucose, Bld: 109 mg/dL — ABNORMAL HIGH (ref 70–99)
Potassium: 4.3 mEq/L (ref 3.5–5.1)
Sodium: 141 mEq/L (ref 135–145)

## 2016-12-15 LAB — LIPID PANEL
CHOL/HDL RATIO: 3
CHOLESTEROL: 216 mg/dL — AB (ref 0–200)
HDL: 74.7 mg/dL (ref >39.00)
NonHDL: 140.99
TRIGLYCERIDES: 207 mg/dL — AB (ref 0.0–149.0)
VLDL: 41.4 mg/dL — AB (ref 0.0–40.0)

## 2016-12-15 LAB — LDL CHOLESTEROL, DIRECT: Direct LDL: 107 mg/dL

## 2016-12-15 MED ORDER — SIMVASTATIN 20 MG PO TABS: 20.0000 mg | ORAL_TABLET | Freq: Every day | ORAL | 1 refills | Status: DC

## 2016-12-15 MED ORDER — HYDROCHLOROTHIAZIDE 12.5 MG PO TABS: 12.5000 mg | ORAL_TABLET | Freq: Every day | ORAL | 1 refills | Status: DC

## 2016-12-15 NOTE — Assessment & Plan Note (Signed)
Chronic problem.  Pt is tolerating statin w/o difficulty.  Applauded her efforts at healthy diet and regular exercise.  Check labs.  Adjust meds prn

## 2016-12-15 NOTE — Patient Instructions (Signed)
Schedule your complete physical in 6 months We'll notify you of your lab results and make any changes if needed Continue to work on healthy diet and regular exercise- you're doing great! Call with any questions or concerns Happy Mother's Day!

## 2016-12-15 NOTE — Progress Notes (Signed)
Pre visit review using our clinic review tool, if applicable. No additional management support is needed unless otherwise documented below in the visit note. 

## 2016-12-15 NOTE — Assessment & Plan Note (Signed)
Chronic problem.  Excellent control today.  Asymptomatic at this time.  Will continue to follow.

## 2016-12-15 NOTE — Progress Notes (Signed)
   Subjective:    Patient ID: Alexandra [name], female    DOB: 1950/08/17, 66 y.o.   MRN: [id number]  HPI HTN- chronic problem, on HCTZ daily w/ good control.  No CP, SOB, HAs, visual changes, edema.  Hyperlipidemia- chronic problem.  On Simvastatin daily.  Pt has lost another 5 lbs.  Continues to exercise daily.  Denies abd pain, N/V.   Review of Systems For ROS see HPI     Objective:   Physical Exam  Constitutional: She is oriented to person, place, and time. She appears well-developed and well-nourished. No distress.  HENT:  Head: Normocephalic and atraumatic.  Eyes: Conjunctivae and EOM are normal. Pupils are equal, round, and reactive to light.  Neck: Normal range of motion. Neck supple. No thyromegaly present.  Cardiovascular: Normal rate, regular rhythm, normal heart sounds and intact distal pulses.   No murmur heard. Pulmonary/Chest: Effort normal and breath sounds normal. No respiratory distress.  Abdominal: Soft. She exhibits no distension. There is no tenderness.  Musculoskeletal: She exhibits no edema.  Lymphadenopathy:    She has no cervical adenopathy.  Neurological: She is alert and oriented to person, place, and time.  Skin: Skin is warm and dry.  Psychiatric: She has a normal mood and affect. Her behavior is normal.  Vitals reviewed.         Assessment & Plan:

## 2016-12-15 NOTE — Addendum Note (Signed)
Addended by: Davis Gourd on: 12/15/2016 10:08 AM   Modules accepted: Orders

## 2016-12-16 ENCOUNTER — Encounter: Payer: Self-pay | Admitting: Family Medicine

## 2016-12-16 ENCOUNTER — Other Ambulatory Visit (INDEPENDENT_AMBULATORY_CARE_PROVIDER_SITE_OTHER): Payer: PRIVATE HEALTH INSURANCE

## 2016-12-16 DIAGNOSIS — R7309 Other abnormal glucose: Secondary | ICD-10-CM

## 2016-12-16 LAB — HEMOGLOBIN A1C: Hgb A1c MFr Bld: 5.7 % (ref 4.6–6.5)

## 2017-05-12 ENCOUNTER — Ambulatory Visit (INDEPENDENT_AMBULATORY_CARE_PROVIDER_SITE_OTHER): Payer: Self-pay | Admitting: Behavioral Health

## 2017-05-12 DIAGNOSIS — Z23 Encounter for immunization: Secondary | ICD-10-CM

## 2017-05-12 NOTE — Progress Notes (Signed)
Pre visit review using our clinic review tool, if applicable. No additional management support is needed unless otherwise documented below in the visit note.  Patient came in clinic for influenza vaccination. IM injection was given in the left deltoid. Patient tolerated the injection well. No s/s of a reaction prior to patient leaving the nurse visit.

## 2017-07-21 ENCOUNTER — Ambulatory Visit: Payer: PRIVATE HEALTH INSURANCE | Admitting: Family Medicine

## 2017-08-06 ENCOUNTER — Ambulatory Visit: Payer: Self-pay | Admitting: Family Medicine

## 2017-08-11 ENCOUNTER — Ambulatory Visit (INDEPENDENT_AMBULATORY_CARE_PROVIDER_SITE_OTHER): Payer: Medicare Other | Admitting: Family Medicine

## 2017-08-11 ENCOUNTER — Encounter: Payer: Self-pay | Admitting: Family Medicine

## 2017-08-11 ENCOUNTER — Other Ambulatory Visit: Payer: Self-pay

## 2017-08-11 VITALS — BP 112/76 | HR 75 | Temp 98.5°F | Resp 16 | Ht 63.0 in | Wt 183.0 lb

## 2017-08-11 DIAGNOSIS — Z9189 Other specified personal risk factors, not elsewhere classified: Secondary | ICD-10-CM | POA: Diagnosis not present

## 2017-08-11 DIAGNOSIS — I10 Essential (primary) hypertension: Secondary | ICD-10-CM | POA: Diagnosis not present

## 2017-08-11 DIAGNOSIS — Z1159 Encounter for screening for other viral diseases: Secondary | ICD-10-CM | POA: Diagnosis not present

## 2017-08-11 DIAGNOSIS — E785 Hyperlipidemia, unspecified: Secondary | ICD-10-CM

## 2017-08-11 DIAGNOSIS — R0683 Snoring: Secondary | ICD-10-CM

## 2017-08-11 DIAGNOSIS — Z23 Encounter for immunization: Secondary | ICD-10-CM

## 2017-08-11 LAB — BASIC METABOLIC PANEL
BUN: 12 mg/dL (ref 6–23)
CHLORIDE: 103 meq/L (ref 96–112)
CO2: 28 mEq/L (ref 19–32)
CREATININE: 0.85 mg/dL (ref 0.40–1.20)
Calcium: 9.5 mg/dL (ref 8.4–10.5)
GFR: 71.33 mL/min (ref >60.00)
Glucose, Bld: 103 mg/dL — ABNORMAL HIGH (ref 70–99)
Potassium: 4.3 mEq/L (ref 3.5–5.1)
Sodium: 141 mEq/L (ref 135–145)

## 2017-08-11 LAB — CBC WITH DIFFERENTIAL/PLATELET
BASOS ABS: 0 10*3/uL (ref 0.0–0.1)
Basophils Relative: 0.7 % (ref 0.0–3.0)
EOS ABS: 0.1 10*3/uL (ref 0.0–0.7)
Eosinophils Relative: 2.3 % (ref 0.0–5.0)
HCT: 46.6 % — ABNORMAL HIGH (ref 36.0–46.0)
Hemoglobin: 15.6 g/dL — ABNORMAL HIGH (ref 12.0–15.0)
LYMPHS ABS: 1.4 10*3/uL (ref 0.7–4.0)
Lymphocytes Relative: 30.4 % (ref 12.0–46.0)
MCHC: 33.4 g/dL (ref 30.0–36.0)
MCV: 91.5 fl (ref 78.0–100.0)
MONO ABS: 0.4 10*3/uL (ref 0.1–1.0)
Monocytes Relative: 8 % (ref 3.0–12.0)
NEUTROS ABS: 2.6 10*3/uL (ref 1.4–7.7)
NEUTROS PCT: 58.6 % (ref 43.0–77.0)
PLATELETS: 279 10*3/uL (ref 150.0–400.0)
RBC: 5.09 Mil/uL (ref 3.87–5.11)
RDW: 13.1 % (ref 11.5–15.5)
WBC: 4.5 10*3/uL (ref 4.0–10.5)

## 2017-08-11 LAB — HEPATIC FUNCTION PANEL
ALK PHOS: 47 U/L (ref 39–117)
ALT: 20 U/L (ref 0–35)
AST: 21 U/L (ref 0–37)
Albumin: 4.4 g/dL (ref 3.5–5.2)
BILIRUBIN DIRECT: 0.2 mg/dL (ref 0.0–0.3)
TOTAL PROTEIN: 6.8 g/dL (ref 6.0–8.3)
Total Bilirubin: 1.3 mg/dL — ABNORMAL HIGH (ref 0.2–1.2)

## 2017-08-11 LAB — LIPID PANEL
Cholesterol: 195 mg/dL (ref 0–200)
HDL: 65.1 mg/dL (ref >39.00)
NONHDL: 129.52
TRIGLYCERIDES: 241 mg/dL — AB (ref 0.0–149.0)
Total CHOL/HDL Ratio: 3
VLDL: 48.2 mg/dL — ABNORMAL HIGH (ref 0.0–40.0)

## 2017-08-11 LAB — LDL CHOLESTEROL, DIRECT: LDL DIRECT: 108 mg/dL

## 2017-08-11 LAB — TSH: TSH: 2.31 u[IU]/mL (ref 0.35–4.50)

## 2017-08-11 NOTE — Assessment & Plan Note (Signed)
Chronic problem.  Tolerating statin w/o difficulty.  Applauded efforts at healthy diet and regular exercise.  Check labs.  Adjust meds prn  

## 2017-08-11 NOTE — Progress Notes (Signed)
   Subjective:    Patient ID: Alexandra [name], female    DOB: 22-Jan-1951, 67 y.o.   MRN: [id number]  HPI HTN- chronic problem, on HCTZ daily w/ good control.  Continue to exercise regularly- very committed.  No CP, SOB, HAs, visual changes, edema.  Hyperlipidemia- chronic problem, on Simvastatin daily.  No abd pain, N/V.  Snoring- pt reports she has been told that she snores loudly.  Will wake feeling poorly rested.  No reported breathing pauses but pt feels this is probably likely.  Has never had OSA evaluation.  Need for Hep C screen   Review of Systems For ROS see HPI     Objective:   Physical Exam  Constitutional: She is oriented to person, place, and time. She appears well-developed and well-nourished. No distress.  HENT:  Head: Normocephalic and atraumatic.  Eyes: Conjunctivae and EOM are normal. Pupils are equal, round, and reactive to light.  Neck: Normal range of motion. Neck supple. No thyromegaly present.  Cardiovascular: Normal rate, regular rhythm, normal heart sounds and intact distal pulses.  No murmur heard. Pulmonary/Chest: Effort normal and breath sounds normal. No respiratory distress.  Abdominal: Soft. She exhibits no distension. There is no tenderness.  Musculoskeletal: She exhibits no edema.  Lymphadenopathy:    She has no cervical adenopathy.  Neurological: She is alert and oriented to person, place, and time.  Skin: Skin is warm and dry.  Psychiatric: She has a normal mood and affect. Her behavior is normal.  Vitals reviewed.         Assessment & Plan:  Snoring- pt is concerned for possible OSA given her reported snoring and waking feeling poorly rested.  Refer to pulmonary for complete evaluation.

## 2017-08-11 NOTE — Assessment & Plan Note (Signed)
Chronic problem.  Well controlled today.  Asymptomatic.  Check labs.  No anticipated med changes.  Will follow. 

## 2017-08-11 NOTE — Addendum Note (Signed)
Addended by: Davis Gourd on: 08/11/2017 10:32 AM   Modules accepted: Orders

## 2017-08-11 NOTE — Patient Instructions (Addendum)
Schedule your Welcome to Medicare Physical in 3-6 months (with me) We'll notify you of your lab results and make any changes if needed Keep up the good work on healthy diet and regular exercise- you look great! We'll call you with your pulmonary appt Call with any questions or concerns Happy New Year!!!

## 2017-08-12 LAB — HEPATITIS C ANTIBODY
HEP C AB: NONREACTIVE
SIGNAL TO CUT-OFF: 0.01 (ref <1.00)

## 2017-08-18 ENCOUNTER — Other Ambulatory Visit: Payer: Self-pay | Admitting: General Practice

## 2017-08-18 MED ORDER — SIMVASTATIN 20 MG PO TABS: 20.0000 mg | ORAL_TABLET | Freq: Every day | ORAL | 1 refills | Status: DC

## 2017-08-18 MED ORDER — RANITIDINE HCL 300 MG PO TABS: 300.0000 mg | ORAL_TABLET | Freq: Every day | ORAL | 0 refills | Status: DC

## 2017-08-18 MED ORDER — HYDROCHLOROTHIAZIDE 12.5 MG PO TABS: 12.5000 mg | ORAL_TABLET | Freq: Every day | ORAL | 1 refills | Status: DC

## 2017-08-18 MED ORDER — MELOXICAM 15 MG PO TABS: 15.0000 mg | ORAL_TABLET | Freq: Every day | ORAL | 0 refills | Status: DC

## 2017-09-06 ENCOUNTER — Other Ambulatory Visit: Payer: Self-pay | Admitting: Family Medicine

## 2017-09-10 ENCOUNTER — Institutional Professional Consult (permissible substitution): Payer: Medicare Other | Admitting: Pulmonary Disease

## 2017-09-22 ENCOUNTER — Encounter: Payer: Self-pay | Admitting: Pulmonary Disease

## 2017-09-22 ENCOUNTER — Ambulatory Visit: Payer: Medicare Other | Admitting: Pulmonary Disease

## 2017-09-22 VITALS — BP 120/80 | HR 78 | Ht 63.0 in | Wt 181.0 lb

## 2017-09-22 DIAGNOSIS — R29818 Other symptoms and signs involving the nervous system: Secondary | ICD-10-CM

## 2017-09-22 NOTE — Progress Notes (Signed)
   Subjective:    Patient ID: [name], female    DOB: [birth year], 67 y.o.   MRN: [id number]  HPI    Review of Systems  Constitutional: Negative for fever and unexpected weight change.  HENT: Negative for congestion, dental problem, ear pain, nosebleeds, postnasal drip, rhinorrhea, sinus pressure, sneezing, sore throat and trouble swallowing.   Eyes: Negative for redness and itching.  Respiratory: Negative for cough, chest tightness, shortness of breath and wheezing.   Cardiovascular: Negative for palpitations and leg swelling.  Gastrointestinal: Negative for nausea and vomiting.  Genitourinary: Negative for dysuria.  Musculoskeletal: Negative for joint swelling.  Skin: Negative for rash.  Allergic/Immunologic: Negative.  Negative for environmental allergies, food allergies and immunocompromised state.  Neurological: Negative for headaches.  Hematological: Does not bruise/bleed easily.  Psychiatric/Behavioral: Negative for dysphoric mood. The patient is not nervous/anxious.        Objective:   Physical Exam        Assessment & Plan:

## 2017-09-22 NOTE — Progress Notes (Signed)
[facility]  Chief Complaint  Patient presents with  . sleep consult    Pt referred by Dr. Birdie Riddle MD. Pt snores, pt wakes up from snoring loudly, and wakes up 3-4 times each night use restroom.     Vital signs: BP 120/80 (BP Location: Left Arm, Cuff Size: Normal)   Pulse 78   Ht 5\' 3"  (1.6 m)   Wt 181 lb (82.1 kg)   SpO2 98%   BMI 32.06 kg/m   History of Present Illness: Alexandra [name] is a 67 y.o. female for evaluation of sleep problems.  She has been concerned about her snoring.  This has gotten worse since menopause.  She is noticing feeling more tired during the day.  Her mouth gets dry at night.  She has trouble sleeping on her back.  She doesn't dream as much as she used to.  She goes to sleep at 11 pm.  She falls asleep after 20 minutes.  She wakes up several times to use the bathroom.  She gets out of bed at 8 am.  She feels okay in the morning.  She denies morning headache.  She does not use anything to help her stay awake.  She had tried using [place], but this hasn't made much of a difference with her ability to fall asleep or stay asleep.  She notices her sleep is much more disrupted if she drinks wine too close to bedtime.  She denies sleep walking, sleep talking, bruxism, or nightmares.  There is no history of restless legs.  She denies sleep hallucinations, sleep paralysis, or cataplexy.  The Epworth score is 9 out of 24.  Physical Exam:  General - pleasant Eyes - pupils reactive ENT - no sinus tenderness, no oral exudate, no LAN, MP 4, enlarged tongue, scalloped tongue Cardiac - regular, no murmur Chest - no wheeze, rales Abd - soft, non tender Ext - no edema Skin - no rashes Neuro - normal strength Psych - normal mood  Discussion: She has snoring, sleep disruption, and daytime sleepiness.  She has hx of HTN and depression.  I am concerned she could have sleep apnea.  We discussed how sleep apnea can affect various  health problems, including risks for hypertension, cardiovascular disease, and diabetes.  We also discussed how sleep disruption can increase risks for accidents, such as while driving.  Weight loss as a means of improving sleep apnea was also reviewed.  Additional treatment options discussed were CPAP therapy, oral appliance, and surgical intervention.  Assessment/Plan:  Suspected sleep apnea. - will arrange for home sleep study   Patient Instructions  Will arrange for home sleep study Will call to arrange for follow up after sleep study reviewed    Chesley Mires, MD [place] 09/22/2017, 2:34 PM Pager:  [phone]  Flow Sheet  Sleep tests:  Review of Systems: Constitutional: Negative for fever and unexpected weight change.  HENT: Negative for congestion, dental problem, ear pain, nosebleeds, postnasal drip, rhinorrhea, sinus pressure, sneezing, sore throat and trouble swallowing.   Eyes: Negative for redness and itching.  Respiratory: Negative for cough, chest tightness, shortness of breath and wheezing.   Cardiovascular: Negative for palpitations and leg swelling.  Gastrointestinal: Negative for nausea and vomiting.  Genitourinary: Negative for dysuria.  Musculoskeletal: Negative for joint swelling.  Skin: Negative for rash.  Allergic/Immunologic: Negative.  Negative for environmental allergies, food allergies and immunocompromised state.  Neurological: Negative for headaches.  Hematological: Does not bruise/bleed easily.  Psychiatric/Behavioral: Negative for dysphoric mood. The patient is not nervous/anxious.    Past Medical History: She  has a past medical history of Depression, Family history of ovarian cancer, Glaucoma, Hyperlipidemia, and Hypertension.  Past Surgical History: She  has a past surgical history that includes Dermoid cyst  excision.  Family History: Her family history includes Esophageal cancer in her maternal uncle; Heart attack  in her father; Hodgkin's lymphoma (age of onset: 17) in her other; Lung cancer in her maternal grandfather; Lung cancer (age of onset: 42) in her sister; Ovarian cancer (age of onset: 39) in her mother; Throat cancer (age of onset: 10) in her brother.  Social History: She  reports that  has never smoked. she has never used smokeless tobacco. She reports that she drinks about 1.2 oz of alcohol per week. She reports that she does not use drugs.  Medications: Allergies as of 09/22/2017   No Known Allergies     Medication List        Accurate as of 09/22/17  2:34 PM. Always use your most recent med list.          aspirin EC 81 MG tablet Take 81 mg by mouth daily.   bimatoprost 0.03 % ophthalmic solution Commonly known as:  LUMIGAN 1 drop at bedtime.   CALCIUM 600 600 MG Tabs tablet Generic drug:  calcium carbonate Take 600 mg by mouth daily.   dorzolamide 2 % ophthalmic solution Commonly known as:  TRUSOPT Place 1 drop into both eyes 2 (two) times daily.   FLAXSEED OIL PO Take 1,400 mg by mouth 2 (two) times daily.   GLUCOSAMINE 1500 COMPLEX Caps Take by mouth.   hydrochlorothiazide 12.5 MG tablet Commonly known as:  HYDRODIURIL Take 1 tablet (12.5 mg total) by mouth daily.   meloxicam 15 MG tablet Commonly known as:  MOBIC TAKE 1 TABLET BY MOUTH  DAILY   MULTIVITAMIN ADULT PO Take by mouth.   ranitidine 300 MG tablet Commonly known as:  ZANTAC TAKE 1 TABLET BY MOUTH AT  BEDTIME   simvastatin 20 MG tablet Commonly known as:  ZOCOR Take 1 tablet (20 mg total) by mouth at bedtime.   SUPER B COMPLEX PO Take by mouth.   Vitamin D 2000 units tablet Take 2,000 Units by mouth daily.   vitamin E 400 UNIT capsule Take 400 Units by mouth daily.   zolpidem 12.5 MG CR tablet Commonly known as:  AMBIEN CR Take 12.5 mg by mouth at bedtime as needed for sleep.

## 2017-09-22 NOTE — Patient Instructions (Signed)
Will arrange for home sleep study Will call to arrange for follow up after sleep study reviewed  

## 2017-10-02 DIAGNOSIS — G4733 Obstructive sleep apnea (adult) (pediatric): Secondary | ICD-10-CM | POA: Diagnosis not present

## 2017-10-03 DIAGNOSIS — G4733 Obstructive sleep apnea (adult) (pediatric): Secondary | ICD-10-CM | POA: Diagnosis not present

## 2017-10-05 ENCOUNTER — Encounter: Payer: Self-pay | Admitting: Pulmonary Disease

## 2017-10-05 ENCOUNTER — Telehealth: Payer: Self-pay | Admitting: Pulmonary Disease

## 2017-10-05 ENCOUNTER — Other Ambulatory Visit: Payer: Self-pay | Admitting: *Deleted

## 2017-10-05 DIAGNOSIS — G4733 Obstructive sleep apnea (adult) (pediatric): Secondary | ICD-10-CM

## 2017-10-05 DIAGNOSIS — R29818 Other symptoms and signs involving the nervous system: Secondary | ICD-10-CM

## 2017-10-05 HISTORY — DX: Obstructive sleep apnea (adult) (pediatric): G47.33

## 2017-10-05 NOTE — Telephone Encounter (Signed)
HST 10/02/17 >> AHI 8, SaO2 low 81%.   Will have my nurse inform pt that sleep study shows mild sleep apnea.  Options are 1) CPAP now, 2) ROV first.  If She is agreeable to CPAP, then please send order for auto CPAP range 5 to 15 cm H2O with heated humidity and mask of choice.  Have download sent 1 month after starting CPAP and set up ROV 2 months after starting CPAP.  ROV can be with me or NP.

## 2017-10-06 NOTE — Telephone Encounter (Signed)
Called and spoke with patient today regarding results and recommendations.  Informed the patient of results today. Pt verbalized understanding and denied any questions or concerns at this time.   Pt doesn't not want cpap machine at this time, would like to discuss if oral appliance could be an option Pt would like to have OV with VS this week for discussion Scheduled OV for Friday October 08 2017 at 9:45am Nothing further needed.

## 2017-10-08 ENCOUNTER — Ambulatory Visit: Payer: Medicare Other | Admitting: Pulmonary Disease

## 2017-10-08 ENCOUNTER — Encounter: Payer: Self-pay | Admitting: Pulmonary Disease

## 2017-10-08 VITALS — BP 112/76 | HR 66 | Ht 64.0 in | Wt 176.0 lb

## 2017-10-08 DIAGNOSIS — Z7189 Other specified counseling: Secondary | ICD-10-CM

## 2017-10-08 DIAGNOSIS — G4733 Obstructive sleep apnea (adult) (pediatric): Secondary | ICD-10-CM

## 2017-10-08 NOTE — Patient Instructions (Signed)
Will arrange for auto CPAP set up  Follow up in 2 months after starting CPAP 

## 2017-10-08 NOTE — Progress Notes (Signed)
[facility]  Chief Complaint  Patient presents with  . Follow-up    Pt has mild sleep apnea, would like to discuss oral appliance options.    Vital signs: BP 112/76 (BP Location: Left Arm, Cuff Size: Normal)   Pulse 66   Ht 5\' 4"  (1.626 m)   Wt 176 lb (79.8 kg)   SpO2 95%   BMI 30.21 kg/m   History of Present Illness: Alexandra [name] is a 67 y.o. female obstructive sleep apnea.  She is here to review her sleep study.  This showed mild sleep apnea with oxygen desaturation.  She had several questions about tx options.  Her husband has sleep apnea and uses [facility].  They have not been pleased with [employer]'s service.  She was concerned she would have to wear a full face mask.  Physical Exam:  General - pleasant Eyes - pupils reactive ENT - no sinus tenderness, no oral exudate, no LAN, MP 4, enlarged tongue, scalloped tongue Cardiac - regular, no murmur Chest - no wheeze, rales Abd - soft, non tender Ext - no edema Skin - no rashes Neuro - normal strength Psych - normal mood   Assessment/Plan:  Obstructive sleep apnea. - We discussed how sleep apnea can affect various health problems, including risks for hypertension, cardiovascular disease, and diabetes.  We also discussed how sleep disruption can increase risks for accidents, such as while driving.  Weight loss as a means of improving sleep apnea was also reviewed.  Additional treatment options discussed were CPAP therapy, oral appliance, and surgical intervention. - she is agreeable to try auto CPAP to start with    Patient Instructions  Will arrange for auto CPAP set up  Follow up in 2 months after starting CPAP   Chesley Mires, MD [place] 10/08/2017, 10:43 AM Pager:  [phone]  Flow Sheet  Sleep tests: HST 10/02/17 >> AHI 8, SaO2 low 81%.  Past Medical History: She  has a past medical history of Depression, Family history of ovarian cancer,  Glaucoma, Hyperlipidemia, Hypertension, and OSA (obstructive sleep apnea) (10/05/2017).  Past Surgical History: She  has a past surgical history that includes Dermoid cyst  excision.  Family History: Her family history includes Esophageal cancer in her maternal uncle; Heart attack in her father; Hodgkin's lymphoma (age of onset: 41) in her other; Lung cancer in her maternal grandfather; Lung cancer (age of onset: 82) in her sister; Ovarian cancer (age of onset: 64) in her mother; Throat cancer (age of onset: 63) in her brother.  Social History: She  reports that  has never smoked. she has never used smokeless tobacco. She reports that she drinks about 1.2 oz of alcohol per week. She reports that she does not use drugs.  Medications: Allergies as of 10/08/2017   No Known Allergies     Medication List        Accurate as of 10/08/17 10:43 AM. Always use your most recent med list.          aspirin EC 81 MG tablet Take 81 mg by mouth daily.   bimatoprost 0.03 % ophthalmic solution Commonly known as:  LUMIGAN 1 drop at bedtime.   CALCIUM 600 600 MG Tabs tablet Generic drug:  calcium carbonate Take 600 mg by mouth daily.   dorzolamide 2 % ophthalmic solution Commonly known as:  TRUSOPT Place 1 drop into both eyes 2 (two) times daily.   FLAXSEED OIL PO Take 1,400 mg by mouth  2 (two) times daily.   GLUCOSAMINE 1500 COMPLEX Caps Take by mouth.   hydrochlorothiazide 12.5 MG tablet Commonly known as:  HYDRODIURIL Take 1 tablet (12.5 mg total) by mouth daily.   meloxicam 15 MG tablet Commonly known as:  MOBIC TAKE 1 TABLET BY MOUTH  DAILY   MULTIVITAMIN ADULT PO Take by mouth.   ranitidine 300 MG tablet Commonly known as:  ZANTAC TAKE 1 TABLET BY MOUTH AT  BEDTIME   simvastatin 20 MG tablet Commonly known as:  ZOCOR Take 1 tablet (20 mg total) by mouth at bedtime.   SUPER B COMPLEX PO Take by mouth.   Vitamin D 2000 units tablet Take 2,000 Units by mouth daily.     vitamin E 400 UNIT capsule Take 400 Units by mouth daily.   zolpidem 12.5 MG CR tablet Commonly known as:  AMBIEN CR Take 12.5 mg by mouth at bedtime as needed for sleep.

## 2017-11-18 DIAGNOSIS — G4733 Obstructive sleep apnea (adult) (pediatric): Secondary | ICD-10-CM | POA: Diagnosis not present

## 2017-11-23 ENCOUNTER — Other Ambulatory Visit: Payer: Self-pay | Admitting: Family Medicine

## 2017-11-23 DIAGNOSIS — Z1231 Encounter for screening mammogram for malignant neoplasm of breast: Secondary | ICD-10-CM

## 2017-11-24 DIAGNOSIS — G4733 Obstructive sleep apnea (adult) (pediatric): Secondary | ICD-10-CM | POA: Diagnosis not present

## 2017-12-15 ENCOUNTER — Ambulatory Visit
Admission: RE | Admit: 2017-12-15 | Discharge: 2017-12-15 | Disposition: A | Discharge status: Home / Self Care | Payer: Medicare Other | Source: Ambulatory Visit | Attending: Family Medicine | Admitting: Family Medicine

## 2017-12-15 DIAGNOSIS — Z1231 Encounter for screening mammogram for malignant neoplasm of breast: Secondary | ICD-10-CM

## 2017-12-18 DIAGNOSIS — G4733 Obstructive sleep apnea (adult) (pediatric): Secondary | ICD-10-CM | POA: Diagnosis not present

## 2017-12-24 ENCOUNTER — Encounter: Payer: Self-pay | Admitting: Family Medicine

## 2017-12-24 ENCOUNTER — Other Ambulatory Visit: Payer: Self-pay

## 2017-12-24 ENCOUNTER — Ambulatory Visit (INDEPENDENT_AMBULATORY_CARE_PROVIDER_SITE_OTHER): Payer: Medicare Other | Admitting: Family Medicine

## 2017-12-24 VITALS — BP 112/68 | HR 67 | Temp 97.7°F | Resp 15 | Ht 63.0 in | Wt 169.2 lb

## 2017-12-24 DIAGNOSIS — Z0000 Encounter for general adult medical examination without abnormal findings: Secondary | ICD-10-CM

## 2017-12-24 DIAGNOSIS — I10 Essential (primary) hypertension: Secondary | ICD-10-CM | POA: Diagnosis not present

## 2017-12-24 LAB — CBC WITH DIFFERENTIAL/PLATELET
BASOS PCT: 0.9 % (ref 0.0–3.0)
Basophils Absolute: 0 10*3/uL (ref 0.0–0.1)
EOS ABS: 0.1 10*3/uL (ref 0.0–0.7)
EOS PCT: 3.1 % (ref 0.0–5.0)
HCT: 42.3 % (ref 36.0–46.0)
Hemoglobin: 14.4 g/dL (ref 12.0–15.0)
Lymphocytes Relative: 36.6 % (ref 12.0–46.0)
Lymphs Abs: 1.4 10*3/uL (ref 0.7–4.0)
MCHC: 34.1 g/dL (ref 30.0–36.0)
MCV: 90.4 fl (ref 78.0–100.0)
MONO ABS: 0.4 10*3/uL (ref 0.1–1.0)
Monocytes Relative: 9.6 % (ref 3.0–12.0)
Neutro Abs: 1.9 10*3/uL (ref 1.4–7.7)
Neutrophils Relative %: 49.8 % (ref 43.0–77.0)
Platelets: 261 10*3/uL (ref 150.0–400.0)
RBC: 4.67 Mil/uL (ref 3.87–5.11)
RDW: 12.9 % (ref 11.5–15.5)
WBC: 3.8 10*3/uL — ABNORMAL LOW (ref 4.0–10.5)

## 2017-12-24 LAB — HEPATIC FUNCTION PANEL
ALT: 15 U/L (ref 0–35)
AST: 18 U/L (ref 0–37)
Albumin: 4.2 g/dL (ref 3.5–5.2)
Alkaline Phosphatase: 36 U/L — ABNORMAL LOW (ref 39–117)
BILIRUBIN DIRECT: 0.2 mg/dL (ref 0.0–0.3)
BILIRUBIN TOTAL: 1.2 mg/dL (ref 0.2–1.2)
Total Protein: 6.7 g/dL (ref 6.0–8.3)

## 2017-12-24 LAB — BASIC METABOLIC PANEL
BUN: 18 mg/dL (ref 6–23)
CHLORIDE: 103 meq/L (ref 96–112)
CO2: 30 meq/L (ref 19–32)
CREATININE: 0.89 mg/dL (ref 0.40–1.20)
Calcium: 9.5 mg/dL (ref 8.4–10.5)
GFR: 67.56 mL/min (ref >60.00)
Glucose, Bld: 101 mg/dL — ABNORMAL HIGH (ref 70–99)
POTASSIUM: 4 meq/L (ref 3.5–5.1)
SODIUM: 140 meq/L (ref 135–145)

## 2017-12-24 LAB — LIPID PANEL
Cholesterol: 165 mg/dL (ref 0–200)
HDL: 73.9 mg/dL (ref >39.00)
LDL Cholesterol: 70 mg/dL (ref 0–99)
NONHDL: 91.43
Total CHOL/HDL Ratio: 2
Triglycerides: 107 mg/dL (ref 0.0–149.0)
VLDL: 21.4 mg/dL (ref 0.0–40.0)

## 2017-12-24 LAB — TSH: TSH: 2.13 u[IU]/mL (ref 0.35–4.50)

## 2017-12-24 NOTE — Assessment & Plan Note (Signed)
Pt's PE WNL.  UTD on mammo, colonoscopy (due next year).  UTD on immunizations.  Advanced directive forms given.  Check labs.  Anticipatory guidance provided.

## 2017-12-24 NOTE — Patient Instructions (Signed)
Follow up in 6 months to recheck BP and cholesterol We'll notify you of your lab results and make any changes if needed Continue to work on healthy diet and regular exercise- you look great!! You are due for colonoscopy next year You are up to date on [place]! Review the Advanced Directives packet and complete at your convenience Call with any questions or concerns Have a great summer!!!

## 2017-12-24 NOTE — Assessment & Plan Note (Signed)
Chronic problem.  Excellent control.  Asymptomatic.  Check labs.  No anticipated med changes.  Will follow. 

## 2017-12-24 NOTE — Progress Notes (Signed)
   Subjective:    Patient ID: Alexandra [name], female    DOB: Jun 21, 1951, 67 y.o.   MRN: [id number]  HPI Here today for Welcome to Medicare CPE.  Risk Factors: HTN- chronic problem, well controlled on HCTZ prn Physical Activity: exercising regularly, pilates, strength, spin, HIIT, [employer] Fall Risk: low risk Depression: denies current sxs Hearing: normal to conversational tones and whispered voice at 6 ft ADL's: independent Cognitive: normal linear thought process, memory and attention intact Home Safety: safe at home Height, Weight, BMI, Visual Acuity: see vitals, vision corrected to 20/20 w/ cataract surgery Counseling: UTD on colonoscopy, mammo, pap, immunizations.  Pt declines [facility] POA and Living Will: forms provided Labs Ordered: See A&P Care Plan: See A&P   Reviewed past medical, surgical, family and social histories.   Review of Systems Patient reports no vision/ hearing changes, adenopathy,fever, weight change,  persistant/recurrent hoarseness , swallowing issues, chest pain, palpitations, edema, persistant/recurrent cough, hemoptysis, dyspnea (rest/exertional/paroxysmal nocturnal), gastrointestinal bleeding (melena, rectal bleeding), abdominal pain, significant heartburn, bowel changes, GU symptoms (dysuria, hematuria, incontinence), Gyn symptoms (abnormal  bleeding, pain),  syncope, focal weakness, memory loss, numbness & tingling, skin/hair/nail changes, abnormal bruising or bleeding, anxiety, or depression.     Objective:   Physical Exam General Appearance:    Alert, cooperative, no distress, appears stated age  Head:    Normocephalic, without obvious abnormality, atraumatic  Eyes:    PERRL, conjunctiva/corneas clear, EOM's intact, fundi    benign, both eyes  Ears:    Normal TM's and external ear canals, both ears  Nose:   Nares normal, septum midline, mucosa normal, no drainage    or sinus tenderness  Throat:   Lips, mucosa, and tongue normal; teeth and gums  normal  Neck:   Supple, symmetrical, trachea midline, no adenopathy;    Thyroid: no enlargement/tenderness/nodules  Back:     Symmetric, no curvature, ROM normal, no CVA tenderness  Lungs:     Clear to auscultation bilaterally, respirations unlabored  Chest Wall:    No tenderness or deformity   Heart:    Regular rate and rhythm, S1 and S2 normal, no murmur, rub   or gallop  Breast Exam:    Deferred to mammo  Abdomen:     Soft, non-tender, bowel sounds active all four quadrants,    no masses, no organomegaly  Genitalia:    Deferred  Rectal:    Extremities:   Extremities normal, atraumatic, no cyanosis or edema  Pulses:   2+ and symmetric all extremities  Skin:   Skin color, texture, turgor normal, no rashes or lesions  Lymph nodes:   Cervical, supraclavicular, and axillary nodes normal  Neurologic:   CNII-XII intact, normal strength, sensation and reflexes    throughout          Assessment & Plan:

## 2017-12-27 ENCOUNTER — Encounter: Payer: Self-pay | Admitting: General Practice

## 2018-01-05 ENCOUNTER — Encounter: Payer: Self-pay | Admitting: Pulmonary Disease

## 2018-01-05 ENCOUNTER — Ambulatory Visit: Payer: Medicare Other | Admitting: Pulmonary Disease

## 2018-01-05 VITALS — BP 120/78 | HR 69 | Ht 63.0 in | Wt 171.8 lb

## 2018-01-05 DIAGNOSIS — G4733 Obstructive sleep apnea (adult) (pediatric): Secondary | ICD-10-CM | POA: Diagnosis not present

## 2018-01-05 NOTE — Patient Instructions (Signed)
Follow up in 1 year.

## 2018-01-05 NOTE — Progress Notes (Signed)
[facility]  Chief Complaint  Patient presents with  . Follow-up    Pt is doing well with cpap machine overall.    Vital signs: BP 120/78 (BP Location: Left Arm, Cuff Size: Normal)   Pulse 69   Ht 5\' 3"  (1.6 m)   Wt 171 lb 12.8 oz (77.9 kg)   SpO2 97%   BMI 30.43 kg/m   History of Present Illness: Alexandra [name] is a 67 y.o. female obstructive sleep apnea.  Since last visit she got started on CPAP.  Using nasal pillows.  Tried full face mask, but this made things worse.  She takes mask of when going to bathroom, but doesn't turn off machine.  She wants to know how to adjust ramping procedure on her CPAP.  She is sleeping better, more rested, and not using bathroom as much at night.   Physical Exam:  General - pleasant Eyes - pupils reactive ENT - no sinus tenderness, no oral exudate, no LAN, MP 4, scalloped tongue, enlarged tongue Cardiac - regular, no murmur Chest - no wheeze, rales Abd - soft, non tender Ext - no edema Skin - no rashes Neuro - normal strength Psych - normal mood  Assessment/Plan:  Obstructive sleep apnea. - she is compliant with CPAP and reports benefit - continue auto CPAP   Patient Instructions  Follow up in 1 year   Chesley Mires, MD [place] 01/05/2018, 3:02 PM Pager:  [phone]  Flow Sheet  Sleep tests: HST 10/02/17 >> AHI 8, SaO2 low 81%. Auto CPAP 12/05/17 to 01/03/18 >> used on 30 of 30 nights with average 7 hrs 9 min.  Average AHI 1.7 with median CPAP 8 and 95 th percentile CPAP 12 cm H2O  Past Medical History: She  has a past medical history of Depression, Family history of ovarian cancer, Glaucoma, Hyperlipidemia, Hypertension, and OSA (obstructive sleep apnea) (10/05/2017).  Past Surgical History: She  has a past surgical history that includes Dermoid cyst  excision and Cataract extraction.  Family History: Her family history includes Esophageal cancer in her  maternal uncle; Heart attack in her father; Hodgkin's lymphoma (age of onset: 65) in her other; Lung cancer in her maternal grandfather; Lung cancer (age of onset: 32) in her sister; Ovarian cancer (age of onset: 26) in her mother; Throat cancer (age of onset: 33) in her brother.  Social History: She  reports that she has never smoked. She has never used smokeless tobacco. She reports that she drinks about 1.2 oz of alcohol per week. She reports that she does not use drugs.  Medications: Allergies as of 01/05/2018   No Known Allergies     Medication List        Accurate as of 01/05/18  3:02 PM. Always use your most recent med list.          aspirin EC 81 MG tablet Take 81 mg by mouth daily.   bimatoprost 0.03 % ophthalmic solution Commonly known as:  LUMIGAN 1 drop at bedtime.   brimonidine 0.2 % ophthalmic solution Commonly known as:  ALPHAGAN   dorzolamide-timolol 22.3-6.8 MG/ML ophthalmic solution Commonly known as:  COSOPT   FLAXSEED OIL PO Take 1,400 mg by mouth 2 (two) times daily.   GLUCOSAMINE 1500 COMPLEX Caps Take by mouth.   hydrochlorothiazide 12.5 MG tablet Commonly known as:  HYDRODIURIL Take 1 tablet (12.5 mg total) by mouth daily.   meloxicam 15 MG tablet Commonly known as:  MOBIC  TAKE 1 TABLET BY MOUTH  DAILY   MULTIVITAMIN ADULT PO Take by mouth.   ranitidine 300 MG tablet Commonly known as:  ZANTAC TAKE 1 TABLET BY MOUTH AT  BEDTIME   RHOPRESSA 0.02 % Soln Generic drug:  Netarsudil Dimesylate   simvastatin 20 MG tablet Commonly known as:  ZOCOR Take 1 tablet (20 mg total) by mouth at bedtime.   SUPER B COMPLEX PO Take by mouth.   Vitamin D 2000 units tablet Take 2,000 Units by mouth daily.   vitamin E 400 UNIT capsule Take 400 Units by mouth daily.   zolpidem 12.5 MG CR tablet Commonly known as:  AMBIEN CR Take 12.5 mg by mouth at bedtime as needed for sleep.

## 2018-01-18 DIAGNOSIS — G4733 Obstructive sleep apnea (adult) (pediatric): Secondary | ICD-10-CM | POA: Diagnosis not present

## 2018-01-20 ENCOUNTER — Other Ambulatory Visit: Payer: Self-pay | Admitting: Family Medicine

## 2018-01-24 DIAGNOSIS — H25012 Cortical age-related cataract, left eye: Secondary | ICD-10-CM | POA: Diagnosis not present

## 2018-01-24 DIAGNOSIS — H401122 Primary open-angle glaucoma, left eye, moderate stage: Secondary | ICD-10-CM | POA: Diagnosis not present

## 2018-01-24 DIAGNOSIS — Z961 Presence of intraocular lens: Secondary | ICD-10-CM | POA: Diagnosis not present

## 2018-01-24 DIAGNOSIS — H401113 Primary open-angle glaucoma, right eye, severe stage: Secondary | ICD-10-CM | POA: Diagnosis not present

## 2018-02-17 DIAGNOSIS — G4733 Obstructive sleep apnea (adult) (pediatric): Secondary | ICD-10-CM | POA: Diagnosis not present

## 2018-02-22 DIAGNOSIS — Z85828 Personal history of other malignant neoplasm of skin: Secondary | ICD-10-CM | POA: Diagnosis not present

## 2018-02-22 DIAGNOSIS — D485 Neoplasm of uncertain behavior of skin: Secondary | ICD-10-CM | POA: Diagnosis not present

## 2018-02-22 DIAGNOSIS — D0462 Carcinoma in situ of skin of left upper limb, including shoulder: Secondary | ICD-10-CM | POA: Diagnosis not present

## 2018-02-22 DIAGNOSIS — D2262 Melanocytic nevi of left upper limb, including shoulder: Secondary | ICD-10-CM | POA: Diagnosis not present

## 2018-02-22 DIAGNOSIS — D225 Melanocytic nevi of trunk: Secondary | ICD-10-CM | POA: Diagnosis not present

## 2018-02-22 DIAGNOSIS — D2272 Melanocytic nevi of left lower limb, including hip: Secondary | ICD-10-CM | POA: Diagnosis not present

## 2018-02-28 DIAGNOSIS — G4733 Obstructive sleep apnea (adult) (pediatric): Secondary | ICD-10-CM | POA: Diagnosis not present

## 2018-03-10 ENCOUNTER — Other Ambulatory Visit: Payer: Self-pay | Admitting: Family Medicine

## 2018-03-20 DIAGNOSIS — G4733 Obstructive sleep apnea (adult) (pediatric): Secondary | ICD-10-CM | POA: Diagnosis not present

## 2018-04-20 DIAGNOSIS — G4733 Obstructive sleep apnea (adult) (pediatric): Secondary | ICD-10-CM | POA: Diagnosis not present

## 2018-04-22 DIAGNOSIS — D485 Neoplasm of uncertain behavior of skin: Secondary | ICD-10-CM | POA: Diagnosis not present

## 2018-04-22 DIAGNOSIS — L7682 Other postprocedural complications of skin and subcutaneous tissue: Secondary | ICD-10-CM | POA: Diagnosis not present

## 2018-04-22 DIAGNOSIS — D0462 Carcinoma in situ of skin of left upper limb, including shoulder: Secondary | ICD-10-CM | POA: Diagnosis not present

## 2018-04-27 DIAGNOSIS — H25813 Combined forms of age-related cataract, bilateral: Secondary | ICD-10-CM | POA: Diagnosis not present

## 2018-04-27 DIAGNOSIS — H43393 Other vitreous opacities, bilateral: Secondary | ICD-10-CM | POA: Diagnosis not present

## 2018-04-27 DIAGNOSIS — H401113 Primary open-angle glaucoma, right eye, severe stage: Secondary | ICD-10-CM | POA: Diagnosis not present

## 2018-05-08 DIAGNOSIS — H2512 Age-related nuclear cataract, left eye: Secondary | ICD-10-CM | POA: Diagnosis not present

## 2018-05-12 DIAGNOSIS — H401121 Primary open-angle glaucoma, left eye, mild stage: Secondary | ICD-10-CM | POA: Diagnosis not present

## 2018-05-12 DIAGNOSIS — H2512 Age-related nuclear cataract, left eye: Secondary | ICD-10-CM | POA: Diagnosis not present

## 2018-05-12 DIAGNOSIS — H401122 Primary open-angle glaucoma, left eye, moderate stage: Secondary | ICD-10-CM | POA: Diagnosis not present

## 2018-05-18 ENCOUNTER — Other Ambulatory Visit: Payer: Self-pay

## 2018-05-18 ENCOUNTER — Ambulatory Visit (INDEPENDENT_AMBULATORY_CARE_PROVIDER_SITE_OTHER): Payer: Medicare Other | Admitting: Family Medicine

## 2018-05-18 ENCOUNTER — Encounter: Payer: Self-pay | Admitting: Family Medicine

## 2018-05-18 ENCOUNTER — Ambulatory Visit (HOSPITAL_BASED_OUTPATIENT_CLINIC_OR_DEPARTMENT_OTHER)
Admission: RE | Admit: 2018-05-18 | Discharge: 2018-05-18 | Disposition: A | Discharge status: Home / Self Care | Payer: Medicare Other | Source: Ambulatory Visit | Attending: Family Medicine | Admitting: Family Medicine

## 2018-05-18 VITALS — BP 123/84 | HR 82 | Temp 98.4°F | Resp 16 | Ht 63.0 in | Wt 169.5 lb

## 2018-05-18 DIAGNOSIS — M25571 Pain in right ankle and joints of right foot: Secondary | ICD-10-CM

## 2018-05-18 DIAGNOSIS — S8261XA Displaced fracture of lateral malleolus of right fibula, initial encounter for closed fracture: Secondary | ICD-10-CM | POA: Diagnosis not present

## 2018-05-18 DIAGNOSIS — Z23 Encounter for immunization: Secondary | ICD-10-CM

## 2018-05-18 DIAGNOSIS — X58XXXA Exposure to other specified factors, initial encounter: Secondary | ICD-10-CM | POA: Diagnosis not present

## 2018-05-18 DIAGNOSIS — S8264XA Nondisplaced fracture of lateral malleolus of right fibula, initial encounter for closed fracture: Secondary | ICD-10-CM | POA: Diagnosis not present

## 2018-05-18 NOTE — Progress Notes (Signed)
   Subjective:    Patient ID: Alexandra [name], female    DOB: March 13, 1951, 67 y.o.   MRN: [id number]  HPI Ankle pain- R ankle, pt fell 3 weeks ago after tripping on uneven sidewalk.  Pt inverted R ankle.  + swelling.  No bruising.  Pt is able to walk on it 'but it hurts'.  Also has a 'tingling sensation'.  Pain improves w/ ibuprofen.   Review of Systems For ROS see HPI     Objective:   Physical Exam  Constitutional: She is oriented to person, place, and time. She appears well-developed and well-nourished. No distress.  Cardiovascular: Intact distal pulses.  Musculoskeletal: She exhibits edema (1-2+ edema over R lateral malleolus) and tenderness (TTP over R ATFL and CFL).  Neurological: She is alert and oriented to person, place, and time.  Skin: Skin is warm and dry. No erythema.  Vitals reviewed.         Assessment & Plan:  R ankle pain- new.  Pt at least has a sprain of ATFL and CFL ligaments but given her TTP over lateral malleolus I want to r/o a possible fx.  xrays ordered.  Brace fitted in office to be worn until results available.  Pt expressed understanding and is in agreement w/ plan.

## 2018-05-18 NOTE — Patient Instructions (Addendum)
Follow up as needed or as scheduled Go to [facility] and get your xrays done [facility] call you with the results and determine the next step Wear your brace while up and about Ibuprofen as needed for pain/swelling ICE! Call with any questions or concerns Hang in there!

## 2018-05-19 ENCOUNTER — Ambulatory Visit: Payer: Medicare Other | Admitting: Family Medicine

## 2018-05-19 ENCOUNTER — Other Ambulatory Visit: Payer: Self-pay | Admitting: Family Medicine

## 2018-05-19 ENCOUNTER — Encounter: Payer: Self-pay | Admitting: Family Medicine

## 2018-05-19 VITALS — BP 131/80 | HR 80 | Ht 63.0 in | Wt 168.0 lb

## 2018-05-19 DIAGNOSIS — T148XXA Other injury of unspecified body region, initial encounter: Secondary | ICD-10-CM

## 2018-05-19 DIAGNOSIS — S82831A Other fracture of upper and lower end of right fibula, initial encounter for closed fracture: Secondary | ICD-10-CM

## 2018-05-19 NOTE — Progress Notes (Signed)
PCP: [name], MD  Subjective:   HPI: Patient is a 67 y.o. female here for right ankle injury.  Patient reports 3 weeks ago she accidentally inverted her right ankle after tripping on uneven sidewalk She though she just bruised her ankle but pain has persisted. Pain lateral right ankle, 2/10 level. Has been icing, taking ibuprofen. Has some tingling into top of foot intermittently. No skin changes.  Past Medical History:  Diagnosis Date  . Depression   . Family history of ovarian cancer   . Glaucoma   . Hyperlipidemia   . Hypertension   . OSA (obstructive sleep apnea) 10/05/2017    Current Outpatient Medications on File Prior to Visit  Medication Sig Dispense Refill  . aspirin EC 81 MG tablet Take 81 mg by mouth daily.    . B Complex-C (SUPER B COMPLEX PO) Take by mouth.    . bimatoprost (LUMIGAN) 0.03 % ophthalmic solution 1 drop at bedtime.      . brimonidine (ALPHAGAN) 0.2 % ophthalmic solution   3  . Cholecalciferol (VITAMIN D) 2000 units tablet Take 2,000 Units by mouth daily.    . dorzolamide-timolol (COSOPT) 22.3-6.8 MG/ML ophthalmic solution   1  . Flaxseed, Linseed, (FLAXSEED OIL PO) Take 1,400 mg by mouth 2 (two) times daily.    . Glucosamine-Chondroit-Vit C-Mn (GLUCOSAMINE 1500 COMPLEX) CAPS Take by mouth.    . hydrochlorothiazide (HYDRODIURIL) 12.5 MG tablet TAKE 1 TABLET BY MOUTH  DAILY 90 tablet 1  . meloxicam (MOBIC) 15 MG tablet TAKE 1 TABLET BY MOUTH  DAILY (Patient taking differently: TAKE 1 TABLET BY MOUTH  AS NEEDED) 90 tablet 0  . Multiple Vitamins-Minerals (MULTIVITAMIN ADULT PO) Take by mouth.    Mckinley Jewel Dimesylate (RHOPRESSA) 0.02 % SOLN     . ranitidine (ZANTAC) 300 MG tablet TAKE 1 TABLET BY MOUTH AT  BEDTIME (Patient taking differently: TAKE 1 TABLET BY MOUTH AT  AS NEEDED) 90 tablet 0  . simvastatin (ZOCOR) 20 MG tablet TAKE 1 TABLET BY MOUTH AT  BEDTIME 90 tablet 1  . vitamin E 400 UNIT capsule Take 400 Units by mouth daily.    Marland Kitchen  zolpidem (AMBIEN CR) 12.5 MG CR tablet Take 12.5 mg by mouth at bedtime as needed for sleep.     No current facility-administered medications on file prior to visit.     Past Surgical History:  Procedure Laterality Date  . CATARACT EXTRACTION     11/04/17  . DERMOID CYST  EXCISION      No Known Allergies  Social History   Socioeconomic History  . Marital status: Married    Spouse name: Not on file  . Number of children: 2  . Years of education: Not on file  . Highest education level: Not on file  Occupational History  . Not on file  Social Needs  . Financial resource strain: Not on file  . Food insecurity:    Worry: Not on file    Inability: Not on file  . Transportation needs:    Medical: Not on file    Non-medical: Not on file  Tobacco Use  . Smoking status: Never Smoker  . Smokeless tobacco: Never Used  Substance and Sexual Activity  . Alcohol use: Yes    Alcohol/week: 2.0 standard drinks    Types: 2 Glasses of wine per week  . Drug use: No  . Sexual activity: Not on file  Lifestyle  . Physical activity:    Days per week:  Not on file    Minutes per session: Not on file  . Stress: Not on file  Relationships  . Social connections:    Talks on phone: Not on file    Gets together: Not on file    Attends religious service: Not on file    Active member of club or organization: Not on file    Attends meetings of clubs or organizations: Not on file    Relationship status: Not on file  . Intimate partner violence:    Fear of current or ex partner: Not on file    Emotionally abused: Not on file    Physically abused: Not on file    Forced sexual activity: Not on file  Other Topics Concern  . Not on file  Social History Narrative  . Not on file    Family History  Problem Relation Age of Onset  . Heart attack Father   . Lung cancer Sister 41       smoker  . Throat cancer Brother 20       smoker  . Ovarian cancer Mother 59  . Esophageal cancer Maternal  Uncle   . Lung cancer Maternal Grandfather        dx in his 60s  . Hodgkin's lymphoma Other 16    BP 131/80   Pulse 80   Ht 5\' 3"  (1.6 m)   Wt 168 lb (76.2 kg)   BMI 29.76 kg/m   Review of Systems: See HPI above.     Objective:  Physical Exam:  Gen: NAD, comfortable in exam room  Right foot/ankle: Mild lateral ankle swelling.  No bruising, other deformity. FROM with 5/5 strength all directions. TTP mildly distal fibula.  No other tenderness. Negative syndesmotic compression. Thompsons test negative. NV intact distally.  Left foot/ankle: No deformity. FROM with 5/5 strength. No tenderness to palpation. NVI distally.   Assessment & Plan:  1. Right distal fibula fracture - independently reviewed radiographs and noted nondisplaced distal fibula fracture - also visualized on ultrasound.  Patient is 3 weeks out - will wear cam walker with weight bearing for next 3 weeks then follow up at that time.  Icing, tylenol, ibuprofen if needed.  Elevation for swelling.  F/u in 3 weeks.

## 2018-05-19 NOTE — Patient Instructions (Signed)
You have a nondisplaced distal fibula fracture. Wear boot when up and walking around for next 3 weeks. Icing 15 minutes at a time 3-4 times a day as needed. Tylenol, ibuprofen only if needed. Elevate above your heart as needed for swelling. Follow up with me in 3 weeks for reevaluation.

## 2018-05-20 DIAGNOSIS — G4733 Obstructive sleep apnea (adult) (pediatric): Secondary | ICD-10-CM | POA: Diagnosis not present

## 2018-06-08 ENCOUNTER — Other Ambulatory Visit: Payer: Self-pay | Admitting: Family Medicine

## 2018-06-09 ENCOUNTER — Encounter: Payer: Self-pay | Admitting: Family Medicine

## 2018-06-09 ENCOUNTER — Ambulatory Visit: Payer: Medicare Other | Admitting: Family Medicine

## 2018-06-09 VITALS — BP 120/77 | HR 75 | Ht 63.0 in | Wt 165.0 lb

## 2018-06-09 DIAGNOSIS — S82831D Other fracture of upper and lower end of right fibula, subsequent encounter for closed fracture with routine healing: Secondary | ICD-10-CM

## 2018-06-09 NOTE — Patient Instructions (Signed)
You're doing great! Switch to supportive shoe when up and walking around for 2 more weeks. Wait on running, brisk walking, jumping activities for 2 more weeks. Icing, elevation as needed for swelling. Follow up with me as needed.

## 2018-06-09 NOTE — Progress Notes (Signed)
PCP: [name], MD  Subjective:   HPI: Patient is a 67 y.o. female here for right ankle injury.  10/10: Patient reports 3 weeks ago she accidentally inverted her right ankle after tripping on uneven sidewalk She though she just bruised her ankle but pain has persisted. Pain lateral right ankle, 2/10 level. Has been icing, taking ibuprofen. Has some tingling into top of foot intermittently. No skin changes.  10/31: Patient reports she feels much better. Is wearing boot when up and walking around. Only issue is some intermittent tingling dorsal right foot. Not requiring tylenol, ibuprofen, or ice at this time. Pain level 0/10. No skin changes.  Past Medical History:  Diagnosis Date  . Depression   . Family history of ovarian cancer   . Glaucoma   . Hyperlipidemia   . Hypertension   . OSA (obstructive sleep apnea) 10/05/2017    Current Outpatient Medications on File Prior to Visit  Medication Sig Dispense Refill  . aspirin EC 81 MG tablet Take 81 mg by mouth daily.    . B Complex-C (SUPER B COMPLEX PO) Take by mouth.    . bimatoprost (LUMIGAN) 0.03 % ophthalmic solution 1 drop at bedtime.      . brimonidine (ALPHAGAN) 0.2 % ophthalmic solution   3  . Cholecalciferol (VITAMIN D) 2000 units tablet Take 2,000 Units by mouth daily.    . dorzolamide-timolol (COSOPT) 22.3-6.8 MG/ML ophthalmic solution   1  . Flaxseed, Linseed, (FLAXSEED OIL PO) Take 1,400 mg by mouth 2 (two) times daily.    . Glucosamine-Chondroit-Vit C-Mn (GLUCOSAMINE 1500 COMPLEX) CAPS Take by mouth.    . hydrochlorothiazide (HYDRODIURIL) 12.5 MG tablet TAKE 1 TABLET BY MOUTH  DAILY 90 tablet 1  . meloxicam (MOBIC) 15 MG tablet TAKE 1 TABLET BY MOUTH  DAILY (Patient taking differently: TAKE 1 TABLET BY MOUTH  AS NEEDED) 90 tablet 0  . Multiple Vitamins-Minerals (MULTIVITAMIN ADULT PO) Take by mouth.    Mckinley Jewel Dimesylate (RHOPRESSA) 0.02 % SOLN     . ranitidine (ZANTAC) 300 MG tablet TAKE 1  TABLET BY MOUTH AT  BEDTIME 90 tablet 0  . simvastatin (ZOCOR) 20 MG tablet TAKE 1 TABLET BY MOUTH AT  BEDTIME 90 tablet 1  . vitamin E 400 UNIT capsule Take 400 Units by mouth daily.    Marland Kitchen zolpidem (AMBIEN CR) 12.5 MG CR tablet Take 12.5 mg by mouth at bedtime as needed for sleep.     No current facility-administered medications on file prior to visit.     Past Surgical History:  Procedure Laterality Date  . CATARACT EXTRACTION     11/04/17  . DERMOID CYST  EXCISION      No Known Allergies  Social History   Socioeconomic History  . Marital status: Married    Spouse name: Not on file  . Number of children: 2  . Years of education: Not on file  . Highest education level: Not on file  Occupational History  . Not on file  Social Needs  . Financial resource strain: Not on file  . Food insecurity:    Worry: Not on file    Inability: Not on file  . Transportation needs:    Medical: Not on file    Non-medical: Not on file  Tobacco Use  . Smoking status: Never Smoker  . Smokeless tobacco: Never Used  Substance and Sexual Activity  . Alcohol use: Yes    Alcohol/week: 2.0 standard drinks    Types: 2  Glasses of wine per week  . Drug use: No  . Sexual activity: Not on file  Lifestyle  . Physical activity:    Days per week: Not on file    Minutes per session: Not on file  . Stress: Not on file  Relationships  . Social connections:    Talks on phone: Not on file    Gets together: Not on file    Attends religious service: Not on file    Active member of club or organization: Not on file    Attends meetings of clubs or organizations: Not on file    Relationship status: Not on file  . Intimate partner violence:    Fear of current or ex partner: Not on file    Emotionally abused: Not on file    Physically abused: Not on file    Forced sexual activity: Not on file  Other Topics Concern  . Not on file  Social History Narrative  . Not on file    Family History  Problem  Relation Age of Onset  . Heart attack Father   . Lung cancer Sister 36       smoker  . Throat cancer Brother 4       smoker  . Ovarian cancer Mother 93  . Esophageal cancer Maternal Uncle   . Lung cancer Maternal Grandfather        dx in his 15s  . Hodgkin's lymphoma Other 16    BP 120/77   Pulse 75   Ht 5\' 3"  (1.6 m)   Wt 165 lb (74.8 kg)   BMI 29.23 kg/m   Review of Systems: See HPI above.     Objective:  Physical Exam:  Gen: NAD, comfortable in exam room  Right foot/ankle: Mild lateral swelling.  No bruising, other deformity. FROM with 5/5 strength all directions. No TTP distal fibula, elsewhere about foot or ankle. Negative ant drawer and talar tilt.   Negative syndesmotic compression. Thompsons test negative. NV intact distally.   Assessment & Plan:  1. Right distal fibula fracture - 6 weeks out from nondisplaced fracture below level of ankle joint.  Clinically healed at this point - advised swelling can take several more weeks to resolve.  Icing, elevation as needed for swelling.  Tylenol, ibuprofen if needed for soreness.  Switch from boot to supportive shoes for next 2 weeks.  Discussed avoidance of plyometrics, running, squats, lunges for about 2 more weeks.  F/u prn.

## 2018-06-20 DIAGNOSIS — G4733 Obstructive sleep apnea (adult) (pediatric): Secondary | ICD-10-CM | POA: Diagnosis not present

## 2018-06-20 DIAGNOSIS — Z961 Presence of intraocular lens: Secondary | ICD-10-CM | POA: Diagnosis not present

## 2018-06-27 ENCOUNTER — Ambulatory Visit: Payer: Medicare Other | Admitting: Family Medicine

## 2018-06-28 ENCOUNTER — Ambulatory Visit (INDEPENDENT_AMBULATORY_CARE_PROVIDER_SITE_OTHER): Payer: Medicare Other | Admitting: Family Medicine

## 2018-06-28 ENCOUNTER — Encounter: Payer: Self-pay | Admitting: Family Medicine

## 2018-06-28 ENCOUNTER — Other Ambulatory Visit: Payer: Self-pay

## 2018-06-28 VITALS — BP 112/72 | HR 71 | Temp 98.5°F | Resp 16 | Ht 63.0 in | Wt 169.4 lb

## 2018-06-28 DIAGNOSIS — I10 Essential (primary) hypertension: Secondary | ICD-10-CM

## 2018-06-28 DIAGNOSIS — E785 Hyperlipidemia, unspecified: Secondary | ICD-10-CM | POA: Diagnosis not present

## 2018-06-28 LAB — HEPATIC FUNCTION PANEL
ALT: 16 U/L (ref 0–35)
AST: 19 U/L (ref 0–37)
Albumin: 4.2 g/dL (ref 3.5–5.2)
Alkaline Phosphatase: 45 U/L (ref 39–117)
BILIRUBIN TOTAL: 0.9 mg/dL (ref 0.2–1.2)
Bilirubin, Direct: 0.1 mg/dL (ref 0.0–0.3)
Total Protein: 6.7 g/dL (ref 6.0–8.3)

## 2018-06-28 LAB — BASIC METABOLIC PANEL
BUN: 16 mg/dL (ref 6–23)
CALCIUM: 9.6 mg/dL (ref 8.4–10.5)
CHLORIDE: 103 meq/L (ref 96–112)
CO2: 30 meq/L (ref 19–32)
CREATININE: 0.88 mg/dL (ref 0.40–1.20)
GFR: 68.34 mL/min (ref >60.00)
Glucose, Bld: 104 mg/dL — ABNORMAL HIGH (ref 70–99)
Potassium: 3.9 mEq/L (ref 3.5–5.1)
Sodium: 140 mEq/L (ref 135–145)

## 2018-06-28 LAB — CBC WITH DIFFERENTIAL/PLATELET
BASOS ABS: 0 10*3/uL (ref 0.0–0.1)
Basophils Relative: 0.7 % (ref 0.0–3.0)
EOS ABS: 0.1 10*3/uL (ref 0.0–0.7)
Eosinophils Relative: 3.2 % (ref 0.0–5.0)
HEMATOCRIT: 42.6 % (ref 36.0–46.0)
HEMOGLOBIN: 14.5 g/dL (ref 12.0–15.0)
LYMPHS PCT: 37 % (ref 12.0–46.0)
Lymphs Abs: 1.4 10*3/uL (ref 0.7–4.0)
MCHC: 34.1 g/dL (ref 30.0–36.0)
MCV: 90.3 fl (ref 78.0–100.0)
MONO ABS: 0.4 10*3/uL (ref 0.1–1.0)
Monocytes Relative: 9.9 % (ref 3.0–12.0)
Neutro Abs: 1.9 10*3/uL (ref 1.4–7.7)
Neutrophils Relative %: 49.2 % (ref 43.0–77.0)
Platelets: 249 10*3/uL (ref 150.0–400.0)
RBC: 4.72 Mil/uL (ref 3.87–5.11)
RDW: 13.3 % (ref 11.5–15.5)
WBC: 3.8 10*3/uL — AB (ref 4.0–10.5)

## 2018-06-28 LAB — LIPID PANEL
CHOL/HDL RATIO: 3
Cholesterol: 184 mg/dL (ref 0–200)
HDL: 71 mg/dL (ref >39.00)
LDL Cholesterol: 87 mg/dL (ref 0–99)
NONHDL: 113.08
TRIGLYCERIDES: 131 mg/dL (ref 0.0–149.0)
VLDL: 26.2 mg/dL (ref 0.0–40.0)

## 2018-06-28 LAB — TSH: TSH: 2.34 u[IU]/mL (ref 0.35–4.50)

## 2018-06-28 NOTE — Assessment & Plan Note (Signed)
Chronic problem.  Tolerating statin w/o difficulty.  Check labs.  Adjust meds prn  

## 2018-06-28 NOTE — Progress Notes (Signed)
   Subjective:    Patient ID: Alexandra [name], female    DOB: [birth year], 67 y.o.   MRN: [id number]  HPI HTN- chronic problem, on HCTZ.  Only taking 'a couple of times a week' when 'I feel like i'm retaining water'.  Well controlled.  No CP, SOB, HAs, visual changes, edema.  Hyperlipidemia- chronic problem, on Simvastatin 20mg  daily.  Exercising regularly- 13-14 hrs/week.  Gym, Spin, HIIT, Barre, Pilates, [employer].  No abd pain, N/V.   Review of Systems For ROS see HPI     Objective:   Physical Exam  Constitutional: She is oriented to person, place, and time. She appears well-developed and well-nourished. No distress.  HENT:  Head: Normocephalic and atraumatic.  Eyes: Pupils are equal, round, and reactive to light. Conjunctivae and EOM are normal.  Neck: Normal range of motion. Neck supple. No thyromegaly present.  Cardiovascular: Normal rate, regular rhythm, normal heart sounds and intact distal pulses.  No murmur heard. Pulmonary/Chest: Effort normal and breath sounds normal. No respiratory distress.  Abdominal: Soft. She exhibits no distension. There is no tenderness.  Musculoskeletal: She exhibits no edema.  Lymphadenopathy:    She has no cervical adenopathy.  Neurological: She is alert and oriented to person, place, and time.  Skin: Skin is warm and dry.  Psychiatric: She has a normal mood and affect. Her behavior is normal.  Vitals reviewed.         Assessment & Plan:

## 2018-06-28 NOTE — Assessment & Plan Note (Signed)
Chronic problem.  Currently well controlled.  Asymptomatic.  Check labs.  No anticipated med changes. 

## 2018-06-28 NOTE — Patient Instructions (Addendum)
Schedule your complete physical in 6 months We'll notify you of your lab results and make any changes if needed Keep up the good work on healthy diet and regular exercise- you look great!! Call with any questions or concerns Happy Holidays!!!

## 2018-07-20 DIAGNOSIS — G4733 Obstructive sleep apnea (adult) (pediatric): Secondary | ICD-10-CM | POA: Diagnosis not present

## 2018-07-25 ENCOUNTER — Other Ambulatory Visit: Payer: Self-pay | Admitting: Family Medicine

## 2018-08-22 DIAGNOSIS — Z961 Presence of intraocular lens: Secondary | ICD-10-CM | POA: Diagnosis not present

## 2018-08-22 DIAGNOSIS — H401113 Primary open-angle glaucoma, right eye, severe stage: Secondary | ICD-10-CM | POA: Diagnosis not present

## 2018-08-22 DIAGNOSIS — H25813 Combined forms of age-related cataract, bilateral: Secondary | ICD-10-CM | POA: Diagnosis not present

## 2018-08-22 DIAGNOSIS — H43393 Other vitreous opacities, bilateral: Secondary | ICD-10-CM | POA: Diagnosis not present

## 2018-09-16 DIAGNOSIS — H53032 Strabismic amblyopia, left eye: Secondary | ICD-10-CM | POA: Diagnosis not present

## 2018-09-16 DIAGNOSIS — H401113 Primary open-angle glaucoma, right eye, severe stage: Secondary | ICD-10-CM | POA: Diagnosis not present

## 2018-09-16 DIAGNOSIS — H401122 Primary open-angle glaucoma, left eye, moderate stage: Secondary | ICD-10-CM | POA: Diagnosis not present

## 2018-09-16 DIAGNOSIS — Z961 Presence of intraocular lens: Secondary | ICD-10-CM | POA: Diagnosis not present

## 2018-09-16 DIAGNOSIS — H43813 Vitreous degeneration, bilateral: Secondary | ICD-10-CM | POA: Diagnosis not present

## 2018-10-24 ENCOUNTER — Other Ambulatory Visit: Payer: Self-pay | Admitting: Family Medicine

## 2018-10-24 DIAGNOSIS — Z1231 Encounter for screening mammogram for malignant neoplasm of breast: Secondary | ICD-10-CM

## 2018-11-21 ENCOUNTER — Other Ambulatory Visit: Payer: Self-pay | Admitting: *Deleted

## 2018-11-21 MED ORDER — MELOXICAM 15 MG PO TABS: 15.0000 mg | ORAL_TABLET | Freq: Every day | ORAL | 0 refills | Status: DC

## 2018-11-25 ENCOUNTER — Other Ambulatory Visit: Payer: Self-pay | Admitting: Family Medicine

## 2018-12-01 DIAGNOSIS — G4733 Obstructive sleep apnea (adult) (pediatric): Secondary | ICD-10-CM | POA: Diagnosis not present

## 2018-12-16 DIAGNOSIS — H43813 Vitreous degeneration, bilateral: Secondary | ICD-10-CM | POA: Diagnosis not present

## 2018-12-16 DIAGNOSIS — Z961 Presence of intraocular lens: Secondary | ICD-10-CM | POA: Diagnosis not present

## 2018-12-16 DIAGNOSIS — H401113 Primary open-angle glaucoma, right eye, severe stage: Secondary | ICD-10-CM | POA: Diagnosis not present

## 2018-12-16 DIAGNOSIS — H401122 Primary open-angle glaucoma, left eye, moderate stage: Secondary | ICD-10-CM | POA: Diagnosis not present

## 2018-12-30 ENCOUNTER — Encounter: Payer: Medicare Other | Admitting: Family Medicine

## 2019-01-09 ENCOUNTER — Other Ambulatory Visit: Payer: Self-pay

## 2019-01-09 ENCOUNTER — Ambulatory Visit
Admission: RE | Admit: 2019-01-09 | Discharge: 2019-01-09 | Disposition: A | Discharge status: Home / Self Care | Payer: Medicare Other | Source: Ambulatory Visit | Attending: Family Medicine | Admitting: Family Medicine

## 2019-01-09 DIAGNOSIS — Z1231 Encounter for screening mammogram for malignant neoplasm of breast: Secondary | ICD-10-CM

## 2019-01-10 ENCOUNTER — Other Ambulatory Visit: Payer: Self-pay | Admitting: Family Medicine

## 2019-01-10 ENCOUNTER — Ambulatory Visit
Admission: RE | Admit: 2019-01-10 | Discharge: 2019-01-10 | Disposition: A | Discharge status: Home / Self Care | Payer: Medicare Other | Source: Ambulatory Visit | Attending: Family Medicine | Admitting: Family Medicine

## 2019-01-10 DIAGNOSIS — N6002 Solitary cyst of left breast: Secondary | ICD-10-CM | POA: Diagnosis not present

## 2019-01-10 DIAGNOSIS — R928 Other abnormal and inconclusive findings on diagnostic imaging of breast: Secondary | ICD-10-CM

## 2019-01-11 ENCOUNTER — Other Ambulatory Visit: Payer: Medicare Other

## 2019-01-22 ENCOUNTER — Other Ambulatory Visit: Payer: Self-pay | Admitting: Family Medicine

## 2019-01-26 ENCOUNTER — Other Ambulatory Visit: Payer: Self-pay | Admitting: Family Medicine

## 2019-03-15 ENCOUNTER — Encounter: Payer: Medicare Other | Admitting: Family Medicine

## 2019-03-23 DIAGNOSIS — H1132 Conjunctival hemorrhage, left eye: Secondary | ICD-10-CM | POA: Diagnosis not present

## 2019-03-23 DIAGNOSIS — H401113 Primary open-angle glaucoma, right eye, severe stage: Secondary | ICD-10-CM | POA: Diagnosis not present

## 2019-03-23 DIAGNOSIS — H401122 Primary open-angle glaucoma, left eye, moderate stage: Secondary | ICD-10-CM | POA: Diagnosis not present

## 2019-03-23 DIAGNOSIS — Z961 Presence of intraocular lens: Secondary | ICD-10-CM | POA: Diagnosis not present

## 2019-04-04 ENCOUNTER — Encounter: Payer: Self-pay | Admitting: Family Medicine

## 2019-04-04 ENCOUNTER — Other Ambulatory Visit: Payer: Self-pay

## 2019-04-04 ENCOUNTER — Ambulatory Visit (INDEPENDENT_AMBULATORY_CARE_PROVIDER_SITE_OTHER): Payer: Medicare Other | Admitting: Family Medicine

## 2019-04-04 VITALS — BP 118/74 | HR 66 | Temp 97.9°F | Resp 16 | Ht 63.0 in | Wt 173.2 lb

## 2019-04-04 DIAGNOSIS — Z0000 Encounter for general adult medical examination without abnormal findings: Secondary | ICD-10-CM

## 2019-04-04 DIAGNOSIS — I10 Essential (primary) hypertension: Secondary | ICD-10-CM | POA: Diagnosis not present

## 2019-04-04 DIAGNOSIS — E663 Overweight: Secondary | ICD-10-CM | POA: Insufficient documentation

## 2019-04-04 DIAGNOSIS — Z23 Encounter for immunization: Secondary | ICD-10-CM

## 2019-04-04 DIAGNOSIS — Z1211 Encounter for screening for malignant neoplasm of colon: Secondary | ICD-10-CM

## 2019-04-04 DIAGNOSIS — M8580 Other specified disorders of bone density and structure, unspecified site: Secondary | ICD-10-CM | POA: Diagnosis not present

## 2019-04-04 DIAGNOSIS — E669 Obesity, unspecified: Secondary | ICD-10-CM | POA: Diagnosis not present

## 2019-04-04 LAB — CBC WITH DIFFERENTIAL/PLATELET
Basophils Absolute: 0 10*3/uL (ref 0.0–0.1)
Basophils Relative: 0.9 % (ref 0.0–3.0)
Eosinophils Absolute: 0.1 10*3/uL (ref 0.0–0.7)
Eosinophils Relative: 4 % (ref 0.0–5.0)
HCT: 40.9 % (ref 36.0–46.0)
Hemoglobin: 13.8 g/dL (ref 12.0–15.0)
Lymphocytes Relative: 40.8 % (ref 12.0–46.0)
Lymphs Abs: 1.5 10*3/uL (ref 0.7–4.0)
MCHC: 33.8 g/dL (ref 30.0–36.0)
MCV: 90.1 fl (ref 78.0–100.0)
Monocytes Absolute: 0.4 10*3/uL (ref 0.1–1.0)
Monocytes Relative: 10.9 % (ref 3.0–12.0)
Neutro Abs: 1.6 10*3/uL (ref 1.4–7.7)
Neutrophils Relative %: 43.4 % (ref 43.0–77.0)
Platelets: 235 10*3/uL (ref 150.0–400.0)
RBC: 4.54 Mil/uL (ref 3.87–5.11)
RDW: 12.9 % (ref 11.5–15.5)
WBC: 3.7 10*3/uL — ABNORMAL LOW (ref 4.0–10.5)

## 2019-04-04 LAB — HEPATIC FUNCTION PANEL
ALT: 13 U/L (ref 0–35)
AST: 23 U/L (ref 0–37)
Albumin: 4.4 g/dL (ref 3.5–5.2)
Alkaline Phosphatase: 45 U/L (ref 39–117)
Bilirubin, Direct: 0.2 mg/dL (ref 0.0–0.3)
Total Bilirubin: 1.1 mg/dL (ref 0.2–1.2)
Total Protein: 6.4 g/dL (ref 6.0–8.3)

## 2019-04-04 LAB — BASIC METABOLIC PANEL
BUN: 17 mg/dL (ref 6–23)
CO2: 27 mEq/L (ref 19–32)
Calcium: 9.6 mg/dL (ref 8.4–10.5)
Chloride: 105 mEq/L (ref 96–112)
Creatinine, Ser: 0.76 mg/dL (ref 0.40–1.20)
GFR: 75.97 mL/min (ref >60.00)
Glucose, Bld: 100 mg/dL — ABNORMAL HIGH (ref 70–99)
Potassium: 4 mEq/L (ref 3.5–5.1)
Sodium: 141 mEq/L (ref 135–145)

## 2019-04-04 LAB — LIPID PANEL
Cholesterol: 189 mg/dL (ref 0–200)
HDL: 72.5 mg/dL (ref >39.00)
LDL Cholesterol: 92 mg/dL (ref 0–99)
NonHDL: 116.92
Total CHOL/HDL Ratio: 3
Triglycerides: 124 mg/dL (ref 0.0–149.0)
VLDL: 24.8 mg/dL (ref 0.0–40.0)

## 2019-04-04 LAB — VITAMIN D 25 HYDROXY (VIT D DEFICIENCY, FRACTURES): VITD: 38.95 ng/mL (ref 30.00–100.00)

## 2019-04-04 LAB — TSH: TSH: 2.37 u[IU]/mL (ref 0.35–4.50)

## 2019-04-04 NOTE — Assessment & Plan Note (Signed)
Check Vit D and repeat DEXA

## 2019-04-04 NOTE — Assessment & Plan Note (Signed)
Chronic problem.  Well controlled.  Asymptomatic.  Check labs.  No anticipated med changes.  Will follow. 

## 2019-04-04 NOTE — Assessment & Plan Note (Signed)
Pt has gained 5 lbs since last visit.  Encouraged healthy diet and regular exercise.  Suspect a lot of this is stress related as her brother died in 11/11/2022 and sister recently dx'd w/ breast cancer.  Will follow.

## 2019-04-04 NOTE — Patient Instructions (Addendum)
Follow up in 6 months to recheck BP and cholesterol We'll notify you of your lab results and make any changes if needed Continue to work on healthy diet and regular exercise- you can do it!! We'll call you with your bone density and colonoscopy appts Call with any questions or concerns Stay Safe! Hang in there!!!

## 2019-04-04 NOTE — Assessment & Plan Note (Signed)
Pt's PE WNL w/ exception of weight.  UTD on mammo.  Due for repeat colonoscopy later this year- referral placed.  Pneumovax and flu shot given today.  Check labs.  Anticipatory guidance provided.

## 2019-04-04 NOTE — Progress Notes (Signed)
   Subjective:    Patient ID: Alexandra [name], female    DOB: [birth year], 68 y.o.   MRN: [id number]  HPI CPE- Due for flu and pneumovax.  Due for DEXA and colonoscopy.  UTD on mammo.  Pt has gained 5 lbs since last visit.   Review of Systems Patient reports no vision/ hearing changes, adenopathy,fever, weight change,  persistant/recurrent hoarseness , swallowing issues, chest pain, palpitations, edema, persistant/recurrent cough, hemoptysis, dyspnea (rest/exertional/paroxysmal nocturnal), gastrointestinal bleeding (melena, rectal bleeding), abdominal pain, significant heartburn, bowel changes, GU symptoms (dysuria, hematuria, incontinence), Gyn symptoms (abnormal  bleeding, pain),  syncope, focal weakness, memory loss, numbness & tingling, skin/hair/nail changes, abnormal bruising or bleeding, anxiety, or depression.     Objective:   Physical Exam General Appearance:    Alert, cooperative, no distress, appears stated age  Head:    Normocephalic, without obvious abnormality, atraumatic  Eyes:    PERRL, conjunctiva/corneas clear, EOM's intact, fundi    benign, both eyes  Ears:    Normal TM's and external ear canals, both ears  Nose:   Deferred due to COVID  Throat:   Neck:   Supple, symmetrical, trachea midline, no adenopathy;    Thyroid: no enlargement/tenderness/nodules  Back:     Symmetric, no curvature, ROM normal, no CVA tenderness  Lungs:     Clear to auscultation bilaterally, respirations unlabored  Chest Wall:    No tenderness or deformity   Heart:    Regular rate and rhythm, S1 and S2 normal, no murmur, rub   or gallop  Breast Exam:    Deferred to GYN  Abdomen:     Soft, non-tender, bowel sounds active all four quadrants,    no masses, no organomegaly  Genitalia:    Deferred to GYN  Rectal:    Extremities:   Extremities normal, atraumatic, no cyanosis or edema  Pulses:   2+ and symmetric all extremities  Skin:   Skin color, texture, turgor normal, no rashes or lesions  Lymph  nodes:   Cervical, supraclavicular, and axillary nodes normal  Neurologic:   CNII-XII intact, normal strength, sensation and reflexes    throughout        Assessment & Plan:

## 2019-04-04 NOTE — Addendum Note (Signed)
Addended by: Davis Gourd on: 04/04/2019 08:53 AM   Modules accepted: Orders

## 2019-04-05 ENCOUNTER — Other Ambulatory Visit: Payer: Self-pay | Admitting: Family Medicine

## 2019-04-05 ENCOUNTER — Encounter: Payer: Self-pay | Admitting: Family Medicine

## 2019-04-06 MED ORDER — ZOLPIDEM TARTRATE ER 12.5 MG PO TBCR: 12.5000 mg | EXTENDED_RELEASE_TABLET | Freq: Every evening | ORAL | 2 refills | Status: DC | PRN

## 2019-04-19 DIAGNOSIS — H401113 Primary open-angle glaucoma, right eye, severe stage: Secondary | ICD-10-CM | POA: Diagnosis not present

## 2019-04-19 DIAGNOSIS — H401122 Primary open-angle glaucoma, left eye, moderate stage: Secondary | ICD-10-CM | POA: Diagnosis not present

## 2019-04-19 DIAGNOSIS — H53032 Strabismic amblyopia, left eye: Secondary | ICD-10-CM | POA: Diagnosis not present

## 2019-04-19 DIAGNOSIS — H43813 Vitreous degeneration, bilateral: Secondary | ICD-10-CM | POA: Diagnosis not present

## 2019-04-19 DIAGNOSIS — Z961 Presence of intraocular lens: Secondary | ICD-10-CM | POA: Diagnosis not present

## 2019-05-30 DIAGNOSIS — Z85828 Personal history of other malignant neoplasm of skin: Secondary | ICD-10-CM | POA: Diagnosis not present

## 2019-05-30 DIAGNOSIS — L570 Actinic keratosis: Secondary | ICD-10-CM | POA: Diagnosis not present

## 2019-05-30 DIAGNOSIS — Z86018 Personal history of other benign neoplasm: Secondary | ICD-10-CM | POA: Diagnosis not present

## 2019-05-30 DIAGNOSIS — D2262 Melanocytic nevi of left upper limb, including shoulder: Secondary | ICD-10-CM | POA: Diagnosis not present

## 2019-06-02 DIAGNOSIS — G4733 Obstructive sleep apnea (adult) (pediatric): Secondary | ICD-10-CM | POA: Diagnosis not present

## 2019-06-13 ENCOUNTER — Other Ambulatory Visit: Payer: Self-pay

## 2019-06-13 ENCOUNTER — Ambulatory Visit
Admission: RE | Admit: 2019-06-13 | Discharge: 2019-06-13 | Disposition: A | Discharge status: Home / Self Care | Payer: Medicare Other | Source: Ambulatory Visit | Attending: Family Medicine | Admitting: Family Medicine

## 2019-06-13 DIAGNOSIS — Z780 Asymptomatic menopausal state: Secondary | ICD-10-CM | POA: Diagnosis not present

## 2019-06-13 DIAGNOSIS — M8580 Other specified disorders of bone density and structure, unspecified site: Secondary | ICD-10-CM

## 2019-06-13 DIAGNOSIS — M8589 Other specified disorders of bone density and structure, multiple sites: Secondary | ICD-10-CM | POA: Diagnosis not present

## 2019-06-30 ENCOUNTER — Other Ambulatory Visit: Payer: Self-pay

## 2019-06-30 ENCOUNTER — Encounter: Payer: Self-pay | Admitting: Gastroenterology

## 2019-06-30 ENCOUNTER — Ambulatory Visit (AMBULATORY_SURGERY_CENTER): Payer: Medicare Other | Admitting: *Deleted

## 2019-06-30 VITALS — Temp 97.3°F | Ht 64.0 in | Wt 182.0 lb

## 2019-06-30 DIAGNOSIS — Z1159 Encounter for screening for other viral diseases: Secondary | ICD-10-CM

## 2019-06-30 DIAGNOSIS — Z1211 Encounter for screening for malignant neoplasm of colon: Secondary | ICD-10-CM

## 2019-06-30 NOTE — Progress Notes (Signed)
COV TEST 12-1 TUEDAY 1230PM  No egg or soy allergy known to patient   issues with past sedation with any surgeries  or procedures-PONV PER PT , no intubation problems  No diet pills per patient No home 02 use per patient  No blood thinners per patient  Pt denies issues with constipation  No A fib or A flutter  EMMI video sent to pt's e mail   Due to the COVID-19 pandemic we are asking patients to follow these guidelines. Please only bring one care partner. Please be aware that your care partner may wait in the car in the parking lot or if they feel like they will be too hot to wait in the car, they may wait in the lobby on the 4th floor. All care partners are required to wear a mask the entire time (we do not have any that we can provide them), they need to practice social distancing, and we will do a Covid check for all patient's and care partners when you arrive. Also we will check their temperature and your temperature. If the care partner waits in their car they need to stay in the parking lot the entire time and we will call them on their cell phone when the patient is ready for discharge so they can bring the car to the front of the building. Also all patient's will need to wear a mask into building.

## 2019-07-11 ENCOUNTER — Other Ambulatory Visit: Payer: Self-pay | Admitting: Gastroenterology

## 2019-07-11 ENCOUNTER — Ambulatory Visit (INDEPENDENT_AMBULATORY_CARE_PROVIDER_SITE_OTHER): Payer: Medicare Other

## 2019-07-11 DIAGNOSIS — Z1159 Encounter for screening for other viral diseases: Secondary | ICD-10-CM

## 2019-07-11 IMAGING — DX DG ANKLE COMPLETE 3+V*R*
3 series · 3 of 3 positions shown · non-contrast
Comparison: None

CLINICAL DATA: Injury to RIGHT ankle 3 weeks ago when she stepped
off a curb

EXAM:
RIGHT ANKLE - COMPLETE 3+ VIEW

[ankle ap]
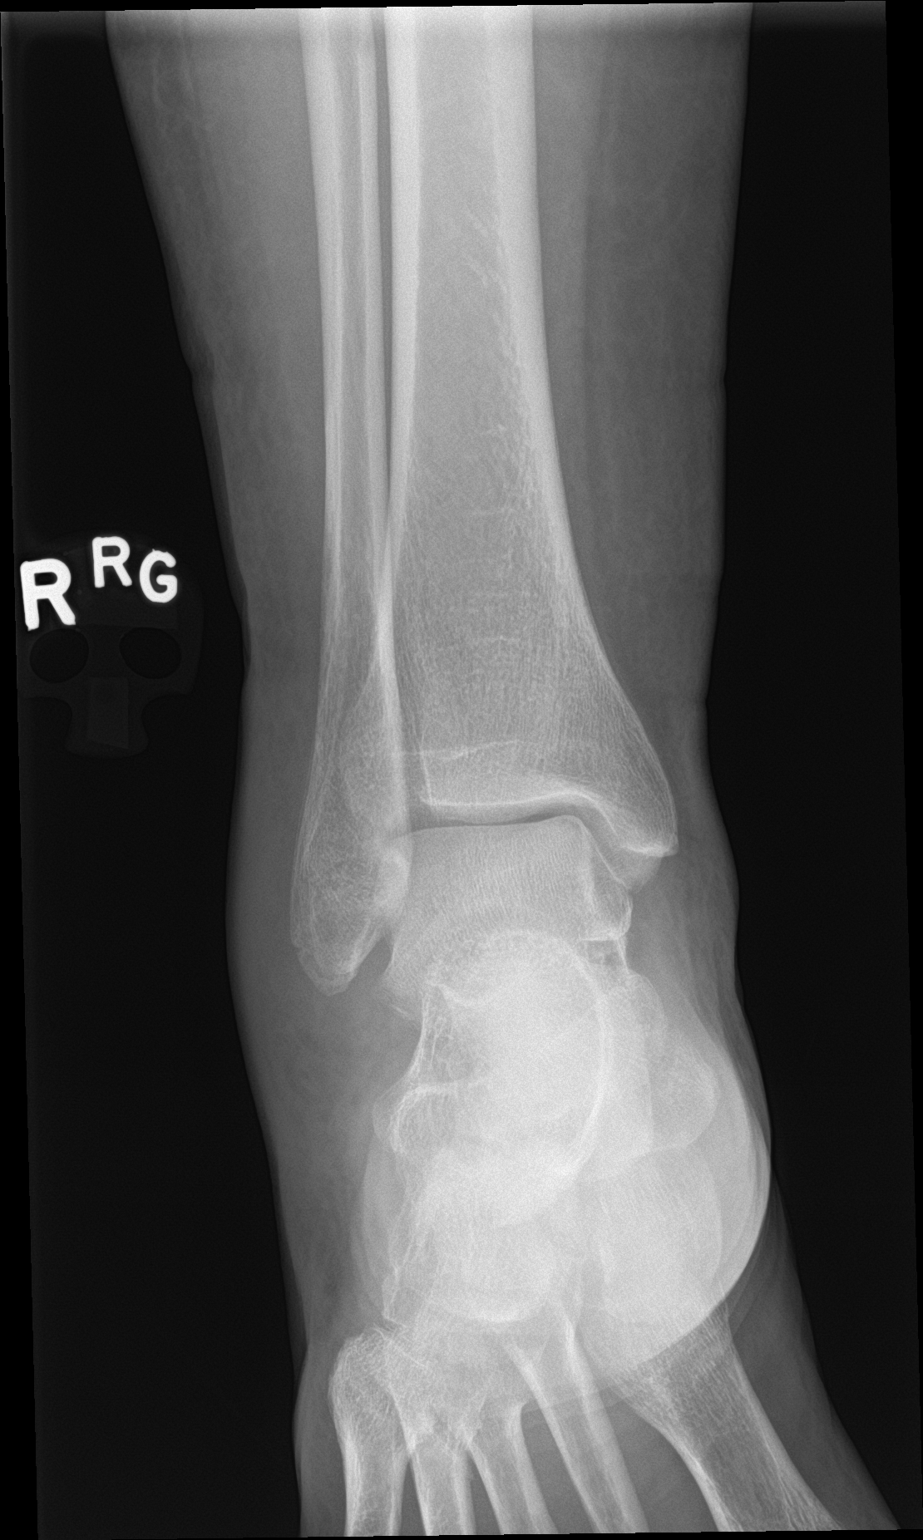

[ankle obl]
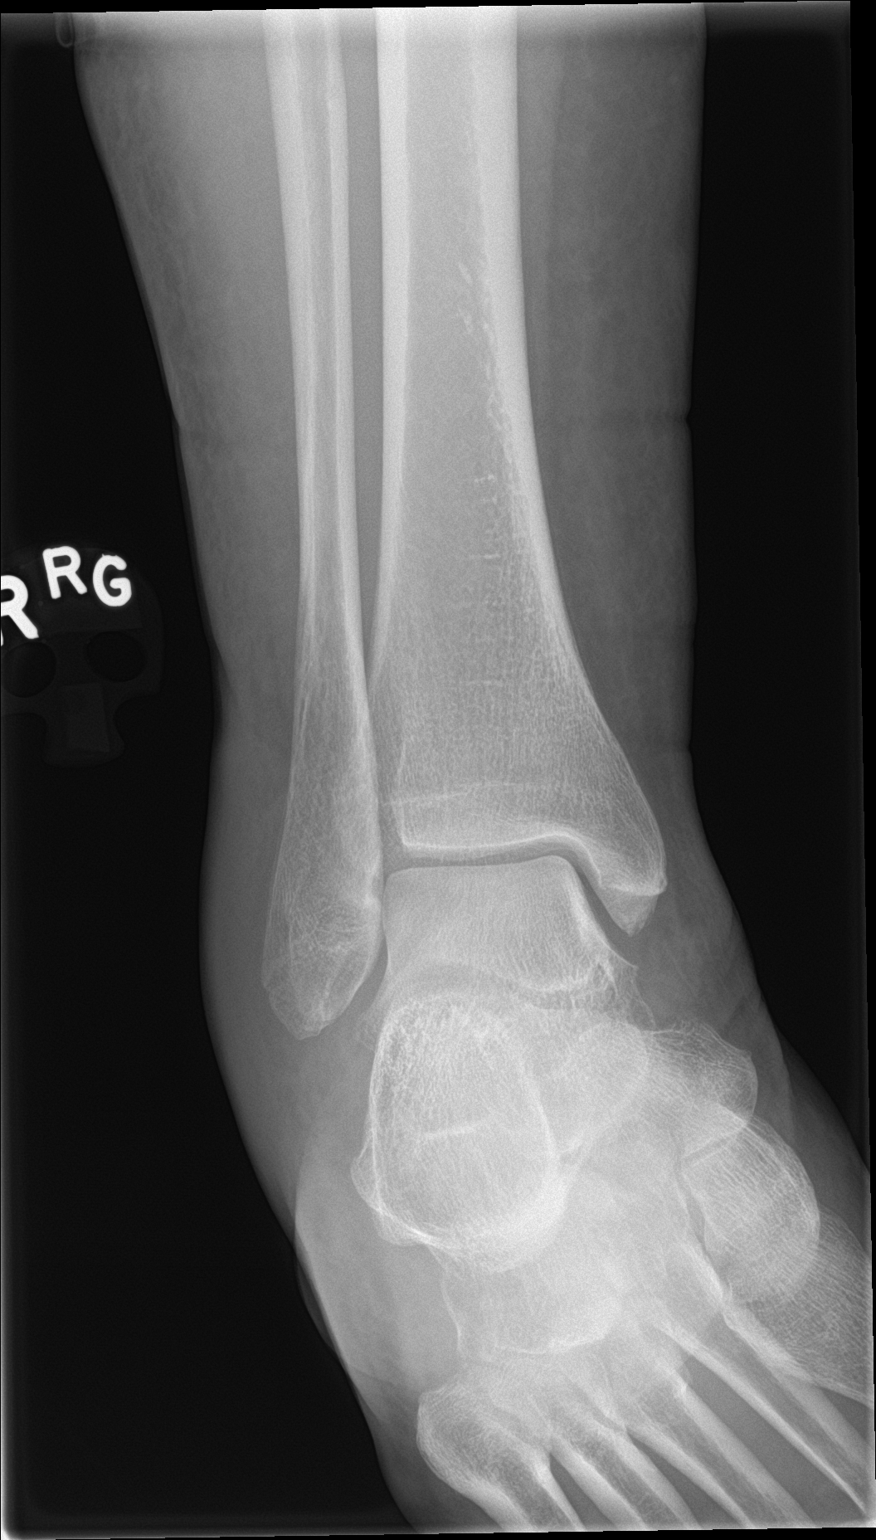

[ankle lat]
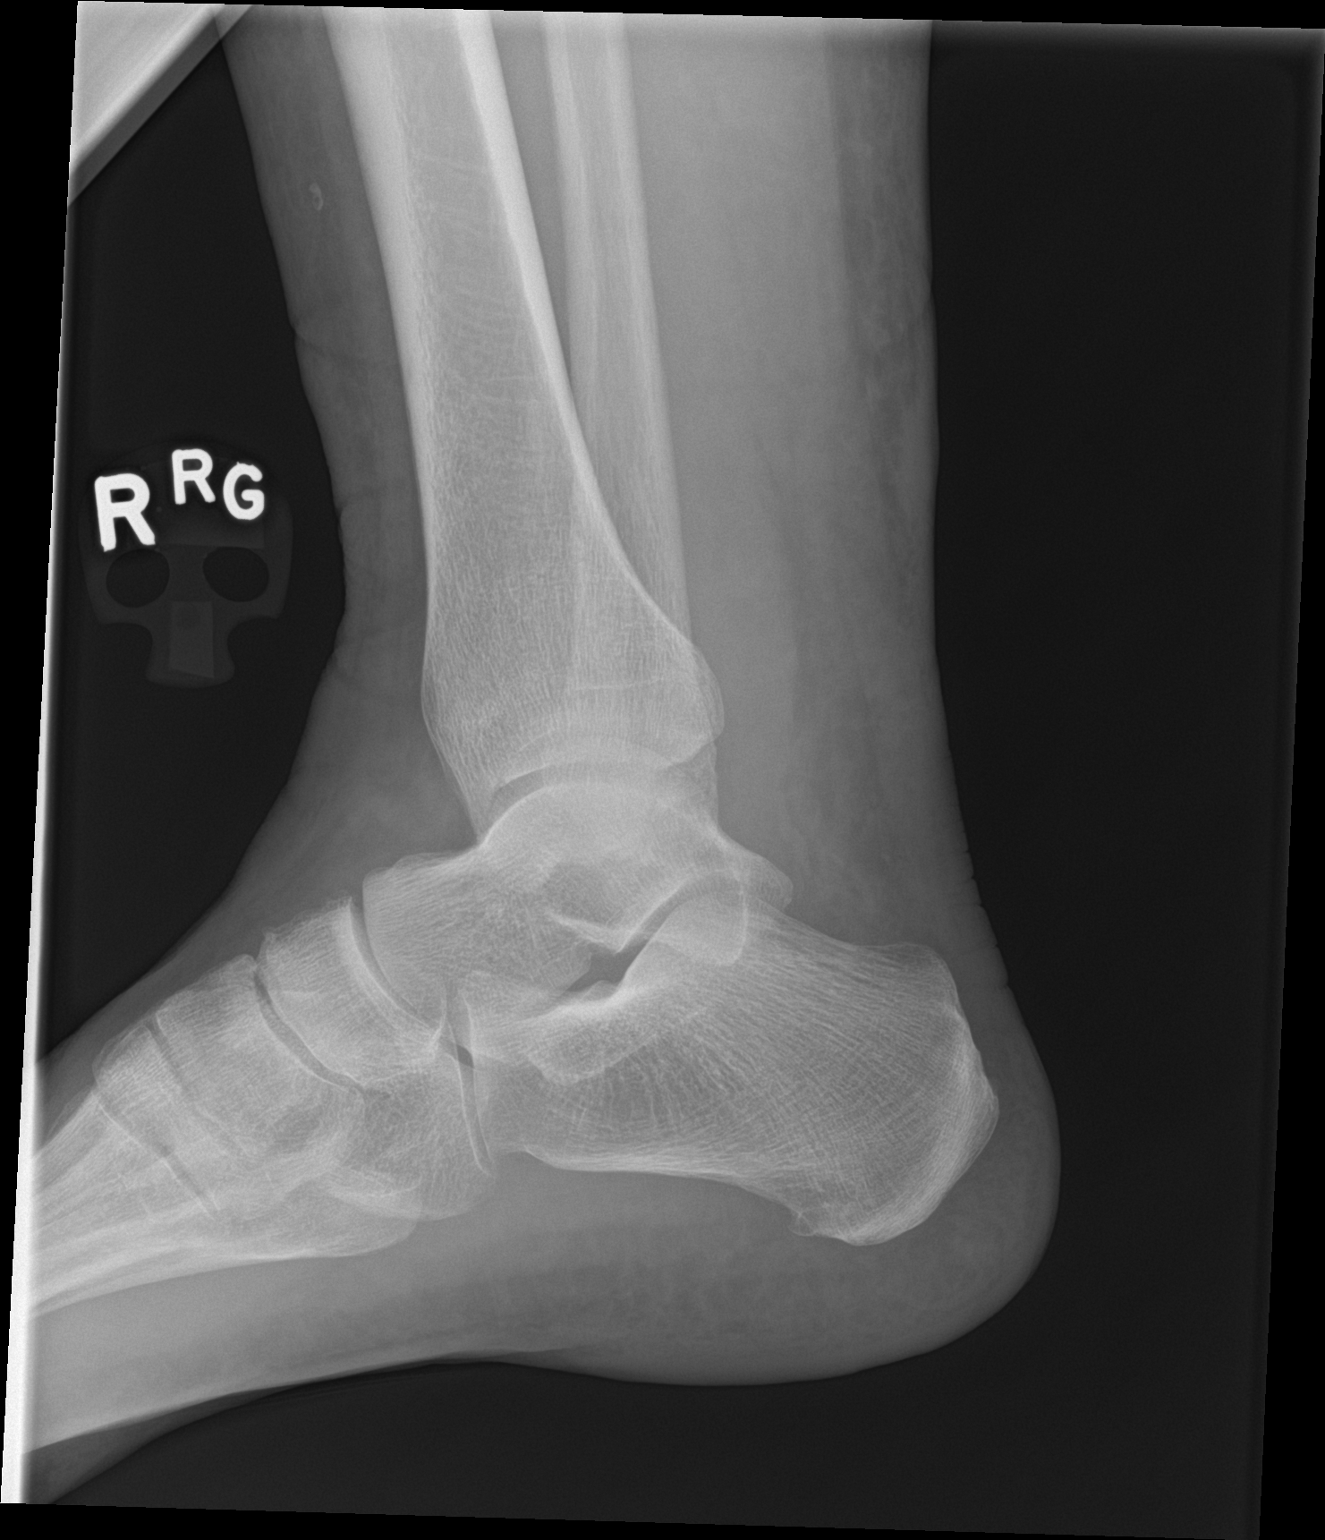

[3 of 3 positions shown; findings below may reference images not displayed]

FINDINGS: Soft tissue swelling greatest laterally.

Osseous demineralization.

Ankle joint spaces preserved.

Nondisplaced fracture identified at distal aspect of lateral
malleolus, with fracture margins slightly indistinct indicating
subacute age.

No additional fracture, dislocation, or bone destruction.
IMPRESSION: Subacute fracture at the distal aspect of the RIGHT lateral
malleolus.

## 2019-07-12 ENCOUNTER — Other Ambulatory Visit: Payer: Self-pay

## 2019-07-12 ENCOUNTER — Ambulatory Visit (INDEPENDENT_AMBULATORY_CARE_PROVIDER_SITE_OTHER): Payer: Medicare Other | Admitting: Pulmonary Disease

## 2019-07-12 ENCOUNTER — Encounter: Payer: Self-pay | Admitting: Pulmonary Disease

## 2019-07-12 VITALS — Ht 63.0 in | Wt 173.0 lb

## 2019-07-12 DIAGNOSIS — G4733 Obstructive sleep apnea (adult) (pediatric): Secondary | ICD-10-CM | POA: Diagnosis not present

## 2019-07-12 LAB — SARS CORONAVIRUS 2 (TAT 6-24 HRS): SARS Coronavirus 2: NEGATIVE

## 2019-07-12 NOTE — Progress Notes (Signed)
[facility]  Chief Complaint  Patient presents with  . OSA (obstructive sleep apnea)    Constitutional:  Ht 5\' 3"  (1.6 m)   Wt 173 lb (78.5 kg)   BMI 30.65 kg/m   Past Medical History:  Osteopenia, HTN, HLD, Glaucoma, Depression, Cataracts  Brief Summary:  Alexandra [name] is a 68 y.o. female with obstructive sleep apnea.  Virtual Visit via Telephone Note  I connected with [name] on 07/12/19 at 10:00 AM EST by telephone and verified that I am speaking with the correct person using two identifiers.  Location: Patient: home Provider: medical office   I discussed the limitations, risks, security and privacy concerns of performing an evaluation and management service by telephone and the availability of in person appointments. I also discussed with the patient that there may be a patient responsible charge related to this service. The patient expressed understanding and agreed to proceed.  Doing well with CPAP.  Has nasal mask.  Not having sinus congestion, dry mouth, sore throat, or aerophagia.  Has mild elevation in air leak on download.  Opens her mouth occasionally at night, but not causing sleep disruption.  Physical Exam:   Deferred.  Assessment/Plan:   Obstructive sleep apnea. - she is compliant with CPAP and reports benefit - continue auto CPAP   There are no Patient Instructions on file for this visit.  I discussed the assessment and treatment plan with the patient. The patient was provided an opportunity to ask questions and all were answered. The patient agreed with the plan and demonstrated an understanding of the instructions.   The patient was advised to call back or seek an in-person evaluation if the symptoms worsen or if the condition fails to improve as anticipated.  I provided 9 minutes of non-face-to-face time during this encounter.   Chesley Mires, MD [place] Pager: [phone]  07/12/2019, 10:49 AM  Flow Sheet    Sleep tests:  HST 10/02/17 >> AHI 8, SaO2 low 81%. Auto CPAP 06/11/19 to 07/10/19 >> used on 29 of 30 nights with average 7 hrs 1 min.  Average AHI 0.7 with median CPAP 10 and 95 th percentile CPAP 13 cm H2O.  Medications:   Allergies as of 07/12/2019   No Known Allergies     Medication List       Accurate as of July 12, 2019 10:49 AM. If you have any questions, ask your nurse or doctor.        aspirin EC 81 MG tablet Take 81 mg by mouth daily.   brimonidine 0.2 % ophthalmic solution Commonly known as: ALPHAGAN   CALCIUM 1200 PO Take by mouth.   dorzolamide-timolol 22.3-6.8 MG/ML ophthalmic solution Commonly known as: COSOPT   FLAXSEED OIL PO Take 1,400 mg by mouth 2 (two) times daily.   Glucosamine 1500 Complex Caps Take by mouth.   hydrochlorothiazide 12.5 MG tablet Commonly known as: HYDRODIURIL TAKE 1 TABLET BY MOUTH  DAILY   Lumigan 0.01 % Soln Generic drug: bimatoprost   meloxicam 15 MG tablet Commonly known as: MOBIC TAKE 1 TABLET BY MOUTH  DAILY   MULTIVITAMIN ADULT PO Take by mouth.   Rhopressa 0.02 % Soln Generic drug: Netarsudil Dimesylate   Rocklatan 0.02-0.005 % Soln Generic drug: Netarsudil-Latanoprost   simvastatin 20 MG tablet Commonly known as: ZOCOR TAKE 1 TABLET BY MOUTH AT  BEDTIME   SUPER B COMPLEX PO Take by mouth.   tretinoin 0.05 % cream  Commonly known as: RETIN-A Apply topically at bedtime.   triamcinolone cream 0.1 % Commonly known as: KENALOG   vitamin C 1000 MG tablet Take 1,000 mg by mouth daily.   Vitamin D 50 MCG (2000 UT) tablet Take 2,000 Units by mouth daily.   vitamin E 400 UNIT capsule Take 400 Units by mouth daily.   zolpidem 12.5 MG CR tablet Commonly known as: AMBIEN CR Take 1 tablet (12.5 mg total) by mouth at bedtime as needed for sleep.       Past Surgical History:  She  has a past surgical history that includes Dermoid cyst  excision; Cataract  extraction; Colonoscopy; and Elbow fracture surgery (2004).  Family History:  Her family history includes Esophageal cancer in her maternal uncle; Heart attack in her father; Hodgkin's lymphoma (age of onset: 80) in an other family member; Lung cancer in her maternal grandfather; Lung cancer (age of onset: 55) in her sister; Ovarian cancer (age of onset: 3) in her mother; Throat cancer (age of onset: 33) in her brother.  Social History:  She  reports that she has never smoked. She has never used smokeless tobacco. She reports current alcohol use of about 2.0 standard drinks of alcohol per week. She reports that she does not use drugs.

## 2019-07-12 NOTE — Patient Instructions (Signed)
Follow up in 1 year.

## 2019-07-14 ENCOUNTER — Encounter: Payer: Self-pay | Admitting: Gastroenterology

## 2019-07-14 ENCOUNTER — Other Ambulatory Visit: Payer: Self-pay

## 2019-07-14 ENCOUNTER — Ambulatory Visit (AMBULATORY_SURGERY_CENTER): Payer: Medicare Other | Admitting: Gastroenterology

## 2019-07-14 VITALS — BP 110/68 | HR 60 | Temp 97.1°F | Resp 18 | Ht 64.0 in | Wt 182.0 lb

## 2019-07-14 DIAGNOSIS — D125 Benign neoplasm of sigmoid colon: Secondary | ICD-10-CM | POA: Diagnosis not present

## 2019-07-14 DIAGNOSIS — Z1211 Encounter for screening for malignant neoplasm of colon: Secondary | ICD-10-CM

## 2019-07-14 MED ORDER — SODIUM CHLORIDE 0.9 % IV SOLN: 500.0000 mL | Freq: Once | INTRAVENOUS | Status: DC

## 2019-07-14 NOTE — Progress Notes (Signed)
Temp JB  VS [facility]  Pt's states no medical or surgical changes since previsit or office visit.

## 2019-07-14 NOTE — Progress Notes (Signed)
Called to room to assist during endoscopic procedure.  Patient ID and intended procedure confirmed with present staff. Received instructions for my participation in the procedure from the performing physician.  

## 2019-07-14 NOTE — Op Note (Signed)
[facility] Patient Name: Alexandra [name] Procedure Date: 07/14/2019 9:20 AM MRN: [id number] Endoscopist: Thornton Park MD, MD Age: 68 Referring MD:  Date of Birth: 12-26-1950 Gender: Female Account #: [id number] Procedure:                Colonoscopy Indications:              Screening for colorectal malignant neoplasm                           Normal screening colonoscopy with Dr. Deatra Ina 07/12/09                           No known family history of colon cancer or polyps                           No baseline GI symptoms Medicines:                Monitored Anesthesia Care Procedure:                Pre-Anesthesia Assessment:                           - Prior to the procedure, a History and Physical                            was performed, and patient medications and                            allergies were reviewed. The patient's tolerance of                            previous anesthesia was also reviewed. The risks                            and benefits of the procedure and the sedation                            options and risks were discussed with the patient.                            All questions were answered, and informed consent                            was obtained. Prior Anticoagulants: The patient has                            taken no previous anticoagulant or antiplatelet                            agents. ASA Grade Assessment: II - A patient with                            mild systemic disease. After reviewing the risks  and benefits, the patient was deemed in                            satisfactory condition to undergo the procedure.                           After obtaining informed consent, the colonoscope                            was passed under direct vision. Throughout the                            procedure, the patient's blood pressure, pulse, and                            oxygen saturations were monitored  continuously. The                            Colonoscope was introduced through the anus and                            advanced to the the terminal ileum, with                            identification of the appendiceal orifice and IC                            valve. A second forward view of the right colon was                            performed. The colonoscopy was performed without                            difficulty. The patient tolerated the procedure                            well. The quality of the bowel preparation was                            excellent. The terminal ileum, ileocecal valve,                            appendiceal orifice, and rectum were photographed. Scope In: 9:28:49 AM Scope Out: 9:40:33 AM Scope Withdrawal Time: 0 hours 9 minutes 45 seconds  Total Procedure Duration: 0 hours 11 minutes 44 seconds  Findings:                 The perianal and digital rectal examinations were                            normal.                           A few small and large-mouthed diverticula were  found in the sigmoid colon and descending colon.                           A 1-2 mm polyp was found in the sigmoid colon. The                            polyp was sessile. The polyp was removed with a                            cold snare. Resection and retrieval were complete.                            Estimated blood loss was minimal.                           Non-bleeding internal hemorrhoids were found. The                            hemorrhoids were small.                           The exam was otherwise without abnormality on                            direct and retroflexion views. Complications:            No immediate complications. Estimated blood loss:                            Minimal. Estimated Blood Loss:     Estimated blood loss was minimal. Impression:               - Diverticulosis in the sigmoid colon and in the                             descending colon.                           - One 1-2 mm polyp in the sigmoid colon, removed                            with a cold snare. Resected and retrieved.                           - The examination was otherwise normal on direct                            and retroflexion views. Recommendation:           - Patient has a contact number available for                            emergencies. The signs and symptoms of potential                            delayed  complications were discussed with the                            patient. Return to normal activities tomorrow.                            Written discharge instructions were provided to the                            patient.                           - High fiber diet.                           - Continue present medications.                           - Await pathology results.                           - Repeat colonoscopy date to be determined after                            pending pathology results are reviewed for                            surveillance. Thornton Park MD, MD 07/14/2019 9:46:22 AM This report has been signed electronically.

## 2019-07-14 NOTE — Progress Notes (Signed)
PT taken to PACU. Monitors in place. VSS. Report given to RN. 

## 2019-07-14 NOTE — Patient Instructions (Signed)
PLlase see handouts given to you on Polyps, Hemorrhoids and Diverticulosis.    YOU HAD AN ENDOSCOPIC PROCEDURE TODAY AT [facility]:   Refer to the procedure report that was given to you for any specific questions about what was found during the examination.  If the procedure report does not answer your questions, please call your gastroenterologist to clarify.  If you requested that your care partner not be given the details of your procedure findings, then the procedure report has been included in a sealed envelope for you to review at your convenience later.  YOU SHOULD EXPECT: Some feelings of bloating in the abdomen. Passage of more gas than usual.  Walking can help get rid of the air that was put into your GI tract during the procedure and reduce the bloating. If you had a lower endoscopy (such as a colonoscopy or flexible sigmoidoscopy) you may notice spotting of blood in your stool or on the toilet paper. If you underwent a bowel prep for your procedure, you may not have a normal bowel movement for a few days.  Please Note:  You might notice some irritation and congestion in your nose or some drainage.  This is from the oxygen used during your procedure.  There is no need for concern and it should clear up in a day or so.  SYMPTOMS TO REPORT IMMEDIATELY:   Following lower endoscopy (colonoscopy or flexible sigmoidoscopy):  Excessive amounts of blood in the stool  Significant tenderness or worsening of abdominal pains  Swelling of the abdomen that is new, acute  Fever of 100F or higher  For urgent or emergent issues, a gastroenterologist can be reached at any hour by calling [phone].   DIET:  We do recommend a small meal at first, but then you may proceed to your regular diet.  Drink plenty of fluids but you should avoid alcoholic beverages for 24 hours.  ACTIVITY:  You should plan to take it easy for the rest of today and you should NOT DRIVE or use heavy  machinery until tomorrow (because of the sedation medicines used during the test).    FOLLOW UP: Our staff will call the number listed on your records 48-72 hours following your procedure to check on you and address any questions or concerns that you may have regarding the information given to you following your procedure. If we do not reach you, we will leave a message.  We will attempt to reach you two times.  During this call, we will ask if you have developed any symptoms of COVID 19. If you develop any symptoms (ie: fever, flu-like symptoms, shortness of breath, cough etc.) before then, please call [phone].  If you test positive for Covid 19 in the 2 weeks post procedure, please call and report this information to [place].    If any biopsies were taken you will be contacted by phone or by letter within the next 1-3 weeks.  Please call us at [phone] if you have not heard about the biopsies in 3 weeks.    SIGNATURES/CONFIDENTIALITY: You and/or your care partner have signed paperwork which will be entered into your electronic medical record.  These signatures attest to the fact that that the information above on your After Visit Summary has been reviewed and is understood.  Full responsibility of the confidentiality of this discharge information lies with you and/or your care-partner.

## 2019-07-18 ENCOUNTER — Telehealth: Payer: Self-pay

## 2019-07-18 ENCOUNTER — Encounter: Payer: Self-pay | Admitting: Gastroenterology

## 2019-07-18 NOTE — Telephone Encounter (Signed)
  Follow up Call-  Call back number 07/14/2019  Post procedure Call Back phone  # [phone]  Permission to leave phone message Yes  Some recent data might be hidden     Patient questions:  Do you have a fever, pain , or abdominal swelling? No. Pain Score  0 *  Have you tolerated food without any problems? Yes.    Have you been able to return to your normal activities? Yes.    Do you have any questions about your discharge instructions: Diet   No. Medications  No. Follow up visit  No.  Do you have questions or concerns about your Care? No.  Actions: * If pain score is 4 or above: No action needed, pain <4. 1. Have you developed a fever since your procedure? no  2.   Have you had an respiratory symptoms (SOB or cough) since your procedure? no  3.   Have you tested positive for COVID 19 since your procedure no  4.   Have you had any family members/close contacts diagnosed with the COVID 19 since your procedure?  no   If yes to any of these questions please route to Joylene John, RN and Alphonsa Gin, Therapist, sports.

## 2019-07-24 DIAGNOSIS — Z961 Presence of intraocular lens: Secondary | ICD-10-CM | POA: Diagnosis not present

## 2019-07-24 DIAGNOSIS — H43813 Vitreous degeneration, bilateral: Secondary | ICD-10-CM | POA: Diagnosis not present

## 2019-07-24 DIAGNOSIS — H401113 Primary open-angle glaucoma, right eye, severe stage: Secondary | ICD-10-CM | POA: Diagnosis not present

## 2019-07-24 DIAGNOSIS — H26492 Other secondary cataract, left eye: Secondary | ICD-10-CM | POA: Diagnosis not present

## 2019-07-24 DIAGNOSIS — H401122 Primary open-angle glaucoma, left eye, moderate stage: Secondary | ICD-10-CM | POA: Diagnosis not present

## 2019-08-09 DIAGNOSIS — L570 Actinic keratosis: Secondary | ICD-10-CM | POA: Diagnosis not present

## 2019-08-25 DIAGNOSIS — H26492 Other secondary cataract, left eye: Secondary | ICD-10-CM | POA: Diagnosis not present

## 2019-09-27 DIAGNOSIS — L570 Actinic keratosis: Secondary | ICD-10-CM | POA: Diagnosis not present

## 2019-10-06 ENCOUNTER — Ambulatory Visit: Payer: Medicare Other | Admitting: Family Medicine

## 2019-10-25 DIAGNOSIS — H401113 Primary open-angle glaucoma, right eye, severe stage: Secondary | ICD-10-CM | POA: Diagnosis not present

## 2019-10-25 DIAGNOSIS — H53032 Strabismic amblyopia, left eye: Secondary | ICD-10-CM | POA: Diagnosis not present

## 2019-10-25 DIAGNOSIS — Z961 Presence of intraocular lens: Secondary | ICD-10-CM | POA: Diagnosis not present

## 2019-10-25 DIAGNOSIS — H401122 Primary open-angle glaucoma, left eye, moderate stage: Secondary | ICD-10-CM | POA: Diagnosis not present

## 2019-10-25 DIAGNOSIS — H43813 Vitreous degeneration, bilateral: Secondary | ICD-10-CM | POA: Diagnosis not present

## 2019-11-03 ENCOUNTER — Encounter: Payer: Self-pay | Admitting: Family Medicine

## 2019-11-03 ENCOUNTER — Other Ambulatory Visit: Payer: Self-pay

## 2019-11-03 ENCOUNTER — Telehealth (INDEPENDENT_AMBULATORY_CARE_PROVIDER_SITE_OTHER): Payer: Medicare Other | Admitting: Family Medicine

## 2019-11-03 ENCOUNTER — Other Ambulatory Visit (INDEPENDENT_AMBULATORY_CARE_PROVIDER_SITE_OTHER): Payer: Medicare Other

## 2019-11-03 VITALS — BP 106/53 | HR 65 | Ht 63.5 in | Wt 181.0 lb

## 2019-11-03 DIAGNOSIS — I10 Essential (primary) hypertension: Secondary | ICD-10-CM | POA: Diagnosis not present

## 2019-11-03 DIAGNOSIS — E669 Obesity, unspecified: Secondary | ICD-10-CM

## 2019-11-03 DIAGNOSIS — E785 Hyperlipidemia, unspecified: Secondary | ICD-10-CM

## 2019-11-03 LAB — CBC WITH DIFFERENTIAL/PLATELET
Basophils Absolute: 0 10*3/uL (ref 0.0–0.1)
Basophils Relative: 0.7 % (ref 0.0–3.0)
Eosinophils Absolute: 0.1 10*3/uL (ref 0.0–0.7)
Eosinophils Relative: 2.2 % (ref 0.0–5.0)
HCT: 39.8 % (ref 36.0–46.0)
Hemoglobin: 13.2 g/dL (ref 12.0–15.0)
Lymphocytes Relative: 33.2 % (ref 12.0–46.0)
Lymphs Abs: 1.6 10*3/uL (ref 0.7–4.0)
MCHC: 33.1 g/dL (ref 30.0–36.0)
MCV: 88.8 fl (ref 78.0–100.0)
Monocytes Absolute: 0.5 10*3/uL (ref 0.1–1.0)
Monocytes Relative: 9.2 % (ref 3.0–12.0)
Neutro Abs: 2.7 10*3/uL (ref 1.4–7.7)
Neutrophils Relative %: 54.7 % (ref 43.0–77.0)
Platelets: 249 10*3/uL (ref 150.0–400.0)
RBC: 4.48 Mil/uL (ref 3.87–5.11)
RDW: 13.3 % (ref 11.5–15.5)
WBC: 4.9 10*3/uL (ref 4.0–10.5)

## 2019-11-03 LAB — LIPID PANEL
Cholesterol: 175 mg/dL (ref 0–200)
HDL: 72.4 mg/dL (ref >39.00)
LDL Cholesterol: 75 mg/dL (ref 0–99)
NonHDL: 102.87
Total CHOL/HDL Ratio: 2
Triglycerides: 138 mg/dL (ref 0.0–149.0)
VLDL: 27.6 mg/dL (ref 0.0–40.0)

## 2019-11-03 LAB — BASIC METABOLIC PANEL
BUN: 23 mg/dL (ref 6–23)
CO2: 30 mEq/L (ref 19–32)
Calcium: 9.4 mg/dL (ref 8.4–10.5)
Chloride: 103 mEq/L (ref 96–112)
Creatinine, Ser: 0.83 mg/dL (ref 0.40–1.20)
GFR: 68.51 mL/min (ref >60.00)
Glucose, Bld: 90 mg/dL (ref 70–99)
Potassium: 3.9 mEq/L (ref 3.5–5.1)
Sodium: 140 mEq/L (ref 135–145)

## 2019-11-03 LAB — HEPATIC FUNCTION PANEL
ALT: 14 U/L (ref 0–35)
AST: 19 U/L (ref 0–37)
Albumin: 4.2 g/dL (ref 3.5–5.2)
Alkaline Phosphatase: 45 U/L (ref 39–117)
Bilirubin, Direct: 0.3 mg/dL (ref 0.0–0.3)
Total Bilirubin: 1.4 mg/dL — ABNORMAL HIGH (ref 0.2–1.2)
Total Protein: 6.4 g/dL (ref 6.0–8.3)

## 2019-11-03 LAB — TSH: TSH: 1.82 u[IU]/mL (ref 0.35–4.50)

## 2019-11-03 NOTE — Addendum Note (Signed)
Addended by: [name] on: 11/03/2019 01:33 PM   Modules accepted: Orders

## 2019-11-03 NOTE — Progress Notes (Signed)
Virtual Visit via Video   I connected with patient on 11/03/19 at  8:30 AM EDT by a video enabled telemedicine application and verified that I am speaking with the correct person using two identifiers.  Location patient: Home Location provider: [facility], Office Persons participating in the virtual visit: Patient, Provider, [place] (Jess B)  I discussed the limitations of evaluation and management by telemedicine and the availability of in person appointments. The patient expressed understanding and agreed to proceed.  Subjective:   HPI:   HTN- chronic problem, on HCTZ 12.5mg  daily.  Well controlled today.  No CP, SOB, HAs, visual changes, edema.  Hyperlipidemia- chronic problem, on Simvastatin 20mg  daily.  No abd pain, N/V.  Obesity- pt has gained 8 lbs since last visit.  BMI is now 31.56.  Exercising daily- boot camp, spin, pilates, kick boxing  Fully Vaccinated  ROS:   See pertinent positives and negatives per HPI.  Patient Active Problem List   Diagnosis Date Noted  . Obesity (BMI 30-39.9) 04/04/2019  . Osteopenia 04/04/2019  . OSA (obstructive sleep apnea) 10/05/2017  . Family history of ovarian cancer 06/10/2015  . Physical exam 03/31/2011  . Hyperlipidemia 05/23/2008  . GLAUCOMA NOS 05/02/2007  . Essential hypertension 05/02/2007    Social History   Tobacco Use  . Smoking status: Never Smoker  . Smokeless tobacco: Never Used  Substance Use Topics  . Alcohol use: Yes    Alcohol/week: 2.0 standard drinks    Types: 2 Glasses of wine per week    Current Outpatient Medications:  .  Ascorbic Acid (VITAMIN C) 1000 MG tablet, Take 1,000 mg by mouth daily., Disp: , Rfl:  .  aspirin EC 81 MG tablet, Take 81 mg by mouth daily., Disp: , Rfl:  .  B Complex-C (SUPER B COMPLEX PO), Take by mouth., Disp: , Rfl:  .  brimonidine (ALPHAGAN) 0.2 % ophthalmic solution, , Disp: , Rfl: 3 .  Calcium Carbonate-Vit D-Min (CALCIUM 1200 PO), Take by mouth., Disp: , Rfl:  .   Cholecalciferol (VITAMIN D) 2000 units tablet, Take 2,000 Units by mouth daily., Disp: , Rfl:  .  dorzolamide-timolol (COSOPT) 22.3-6.8 MG/ML ophthalmic solution, , Disp: , Rfl: 1 .  Flaxseed, Linseed, (FLAXSEED OIL PO), Take 1,400 mg by mouth 2 (two) times daily., Disp: , Rfl:  .  Glucosamine-Chondroit-Vit C-Mn (GLUCOSAMINE 1500 COMPLEX) CAPS, Take by mouth., Disp: , Rfl:  .  hydrochlorothiazide (HYDRODIURIL) 12.5 MG tablet, TAKE 1 TABLET BY MOUTH  DAILY, Disp: 90 tablet, Rfl: 3 .  LUMIGAN 0.01 % SOLN, , Disp: , Rfl:  .  meloxicam (MOBIC) 15 MG tablet, TAKE 1 TABLET BY MOUTH  DAILY, Disp: 90 tablet, Rfl: 0 .  Multiple Vitamins-Minerals (MULTIVITAMIN ADULT PO), Take by mouth., Disp: , Rfl:  .  RHOPRESSA 0.02 % SOLN, , Disp: , Rfl:  .  ROCKLATAN 0.02-0.005 % SOLN, , Disp: , Rfl:  .  simvastatin (ZOCOR) 20 MG tablet, TAKE 1 TABLET BY MOUTH AT  BEDTIME, Disp: 90 tablet, Rfl: 3 .  tretinoin (RETIN-A) 0.05 % cream, Apply topically at bedtime., Disp: , Rfl:  .  triamcinolone cream (KENALOG) 0.1 %, , Disp: , Rfl:  .  vitamin E 400 UNIT capsule, Take 400 Units by mouth daily., Disp: , Rfl:  .  zolpidem (AMBIEN CR) 12.5 MG CR tablet, Take 1 tablet (12.5 mg total) by mouth at bedtime as needed for sleep., Disp: 30 tablet, Rfl: 2  No Known Allergies  Objective:   BP Marland Kitchen)  106/53   Pulse 65   Ht 5' 3.5" (1.613 m)   Wt 181 lb (82.1 kg)   BMI 31.56 kg/m   AAOx3, NAD NCAT, EOMI No obvious CN deficits Coloring WNL Pt is able to speak clearly, coherently without shortness of breath or increased work of breathing.  Thought process is linear.  Mood is appropriate.   Assessment and Plan:   HTN- chronic problem.  Excellent control.  Asymptomatic.  Check labs.  No anticipated med changes.  Will follow.  Hyperlipidemia- chronic problem.  Tolerating statin w/o difficulty.  Exercising regularly.  Check labs.  Adjust meds prn   Obesity- deteriorated over the last year but pt is aware and working on it  extensively.  Applauded her efforts and encouraged her to continue.  Will follow.   Annye Asa, MD 11/03/2019

## 2019-11-03 NOTE — Progress Notes (Signed)
I have discussed the procedure for the virtual visit with the patient who has given consent to proceed with assessment and treatment.   Aundrea Horace L Georgia Baria, CMA     

## 2019-11-21 ENCOUNTER — Telehealth: Payer: Self-pay | Admitting: Family Medicine

## 2019-11-21 NOTE — Progress Notes (Signed)
°  Chronic Care Management   Outreach Note  11/21/2019 Name: MARIADELOURDES DORFF MRN: [id number] DOB: 02/10/1951  Referred by: [name], MD Reason for referral : No chief complaint on file.   An unsuccessful telephone outreach was attempted today. The patient was referred to the pharmacist for assistance with care management and care coordination.   Follow Up Plan:   Earney Hamburg Upstream Scheduler

## 2019-11-22 ENCOUNTER — Telehealth: Payer: Self-pay | Admitting: Family Medicine

## 2019-11-22 NOTE — Progress Notes (Signed)
  Chronic Care Management   Note  11/22/2019 Name: KENISHIA ZALDIVAR MRN: [id number] DOB: 17-Jan-1951  NAHDIA PREYER is a 69 y.o. year old female who is a primary care patient of Birdie Riddle, Aundra Millet, MD. I reached out to [name] by phone today in response to a referral sent by Ms. Connye Burkitt Simms's PCP, [name], MD.   Ms. Sliva was given information about Chronic Care Management services today including:  1. CCM service includes personalized support from designated clinical staff supervised by her physician, including individualized plan of care and coordination with other care providers 2. 24/7 contact phone numbers for assistance for urgent and routine care needs. 3. Service will only be billed when office clinical staff spend 20 minutes or more in a month to coordinate care. 4. Only one practitioner may furnish and bill the service in a calendar month. 5. The patient may stop CCM services at any time (effective at the end of the month) by phone call to the office staff.   Patient agreed to services and verbal consent obtained.   Follow up plan:   Earney Hamburg Upstream Scheduler

## 2019-11-27 ENCOUNTER — Other Ambulatory Visit: Payer: Self-pay | Admitting: Family Medicine

## 2019-11-27 DIAGNOSIS — Z1231 Encounter for screening mammogram for malignant neoplasm of breast: Secondary | ICD-10-CM

## 2019-12-01 ENCOUNTER — Other Ambulatory Visit: Payer: Self-pay | Admitting: General Practice

## 2019-12-01 DIAGNOSIS — E785 Hyperlipidemia, unspecified: Secondary | ICD-10-CM

## 2019-12-01 DIAGNOSIS — I10 Essential (primary) hypertension: Secondary | ICD-10-CM

## 2019-12-01 DIAGNOSIS — E669 Obesity, unspecified: Secondary | ICD-10-CM

## 2019-12-20 ENCOUNTER — Telehealth: Payer: Self-pay

## 2019-12-20 ENCOUNTER — Ambulatory Visit: Payer: Medicare Other

## 2019-12-20 DIAGNOSIS — E785 Hyperlipidemia, unspecified: Secondary | ICD-10-CM

## 2019-12-20 DIAGNOSIS — I10 Essential (primary) hypertension: Secondary | ICD-10-CM

## 2019-12-20 DIAGNOSIS — M8580 Other specified disorders of bone density and structure, unspecified site: Secondary | ICD-10-CM

## 2019-12-20 DIAGNOSIS — E669 Obesity, unspecified: Secondary | ICD-10-CM

## 2019-12-20 MED ORDER — ZOLPIDEM TARTRATE ER 6.25 MG PO TBCR: 6.2500 mg | EXTENDED_RELEASE_TABLET | Freq: Every evening | ORAL | 1 refills | Status: DC | PRN

## 2019-12-20 NOTE — Telephone Encounter (Signed)
Prescription for 6.25mg  sent to pharmacy as requested

## 2019-12-20 NOTE — Telephone Encounter (Signed)
Alexandra [name] has been splitting her zolpidem cr 12.5 mg tablet in half. Since this tab isn't scored / shouldn't be split/crushed, can we change her Rx to zolpidem cr 6.25 mg tablet and send to [facility]? Thank you

## 2019-12-20 NOTE — Telephone Encounter (Signed)
Noted  

## 2019-12-20 NOTE — Patient Instructions (Addendum)
Please call me at [phone] (direct line) with any questions - thank you!  - Edyth Gunnels., Clinical Pharmacist  Goals Addressed            This Visit's Progress   . PharmD Care Plan       CARE PLAN ENTRY  Current Barriers:  . Chronic Disease Management support, education, and care coordination needs related to:  Marland Kitchen Essential hypertension .  Hyperlipidemia, unspecified hyperlipidemia type .  Osteopenia, unspecified location .  Obesity (BMI 30-39.9)     Hypertension . Pharmacist Clinical Goal(s): o Over the next 180 days, patient will work with PharmD and providers to maintain BP goal <130/80 . Current regimen:  o Hydrochlorothiazide 12.5 mg once daily . Interventions: o Continue current management . Patient self care activities - Over the next 180 days, patient will: o Check BP daily, document, and provide at future appointments - Consider purchasing home blood pressure monitor with upper arm cuff o Ensure daily salt intake < 2300 mg/day  Hyperlipidemia . Pharmacist Clinical Goal(s): o Over the next 180 days, patient will work with PharmD and providers to maintain LDL goal < 100 . Current regimen:  o Simvastatin 20 mg once daily  . Interventions: o Continue current management . Patient self care activities - Over the next 180 days, patient will: o Continue current medication along with diet/exercise  Weight loss . Pharmacist Clinical Goal(s) o Over the next 180 days, patient will work with PharmD and providers to maintain healthy body weight. . Current regimen:  o Daily exercise, including fitness class 2x/day and walking.  o Avoidance of chocolate/sweets o Portion control  . Interventions: o None  . Patient self care activities - Over the next 180 days, patient will: o Keep up the hard work! Continue to focus on: - Daily exercise, including fitness class 2x/day and walking.  - Avoidance of chocolate/sweets - Portion control  Osteopenia . Pharmacist Clinical  Goal(s) o Over the next 180 days, patient will work with PharmD and providers to minimize risk of major fractures . Current regimen:  o Vitamin d 2000 units daily o Calcium 1200 mg daily  . Interventions: o Continue current management . Patient self care activities - Over the next 180 days, patient will: o Continue vitamin d/calcium supplementation.  o Continue weight-bearing and muscle strengthening exercises for building and maintaining bone density.  Medication management . Pharmacist Clinical Goal(s): o Over the next 180 days, patient will work with PharmD and providers to maintain optimal medication adherence . Current pharmacy: [facility] mail order . Interventions o Comprehensive medication review performed. o Continue current medication management strategy . Patient self care activities - Over the next 180 days, patient will: o Focus on medication adherence by continuing current management o Take medications as prescribed o Report any questions or concerns to PharmD and/or provider(s) Initial goal documentation.      Ms. Doidge was given information about Chronic Care Management services today including:  1. CCM service includes personalized support from designated clinical staff supervised by her physician, including individualized plan of care and coordination with other care providers 2. 24/7 contact phone numbers for assistance for urgent and routine care needs. 3. Standard insurance, coinsurance, copays and deductibles apply for chronic care management only during months in which we provide at least 20 minutes of these services. Most insurances cover these services at 100%, however patients may be responsible for any copay, coinsurance and/or deductible if applicable. This service may help you avoid the need  for more expensive face-to-face services. 4. Only one practitioner may furnish and bill the service in a calendar month. 5. The patient may stop CCM services at any  time (effective at the end of the month) by phone call to the office staff.  Patient agreed to services and verbal consent obtained.   The patient verbalized understanding of instructions provided today and agreed to receive a mailed copy of patient instruction and/or educational materials. Telephone follow up appointment with pharmacy team member scheduled for: See next appointment with "Care Management Staff" under "What's Next" below.   Thank you!  [name], Pharm.D., BCGP Clinical Pharmacist [facility] [phone] Health Maintenance, Female Adopting a healthy lifestyle and getting preventive care are important in promoting health and wellness. Ask your health care provider about:  The right schedule for you to have regular tests and exams.  Things you can do on your own to prevent diseases and keep yourself healthy. What should I know about diet, weight, and exercise? Eat a healthy diet   Eat a diet that includes plenty of vegetables, fruits, low-fat dairy products, and lean protein.  Do not eat a lot of foods that are high in solid fats, added sugars, or sodium. Maintain a healthy weight Body mass index (BMI) is used to identify weight problems. It estimates body fat based on height and weight. Your health care provider can help determine your BMI and help you achieve or maintain a healthy weight. Get regular exercise Get regular exercise. This is one of the most important things you can do for your health. Most adults should:  Exercise for at least 150 minutes each week. The exercise should increase your heart rate and make you sweat (moderate-intensity exercise).  Do strengthening exercises at least twice a week. This is in addition to the moderate-intensity exercise.  Spend less time sitting. Even light physical activity can be beneficial. Watch cholesterol and blood lipids Have your blood tested for lipids and cholesterol at 69  years of age, then have this test every 5 years. Have your cholesterol levels checked more often if:  Your lipid or cholesterol levels are high.  You are older than 68 years of age.  You are at high risk for heart disease. What should I know about cancer screening? Depending on your health history and family history, you may need to have cancer screening at various ages. This may include screening for:  Breast cancer.  Cervical cancer.  Colorectal cancer.  Skin cancer.  Lung cancer. What should I know about heart disease, diabetes, and high blood pressure? Blood pressure and heart disease  High blood pressure causes heart disease and increases the risk of stroke. This is more likely to develop in people who have high blood pressure readings, are of African descent, or are overweight.  Have your blood pressure checked: ? Every 3-5 years if you are 56-58 years of age. ? Every year if you are 17 years old or older. Diabetes Have regular diabetes screenings. This checks your fasting blood sugar level. Have the screening done:  Once every three years after age 69 if you are at a normal weight and have a low risk for diabetes.  More often and at a younger age if you are overweight or have a high risk for diabetes. What should I know about preventing infection? Hepatitis B If you have a higher risk for hepatitis B, you should be screened for this virus. Talk with your health  care provider to find out if you are at risk for hepatitis B infection. Hepatitis C Testing is recommended for:  Everyone born from [birth year] through 1964.  Anyone with known risk factors for hepatitis C. Sexually transmitted infections (STIs)  Get screened for STIs, including gonorrhea and chlamydia, if: ? You are sexually active and are younger than 68 years of age. ? You are older than 68 years of age and your health care provider tells you that you are at risk for this type of infection. ? Your sexual  activity has changed since you were last screened, and you are at increased risk for chlamydia or gonorrhea. Ask your health care provider if you are at risk.  Ask your health care provider about whether you are at high risk for HIV. Your health care provider may recommend a prescription medicine to help prevent HIV infection. If you choose to take medicine to prevent HIV, you should first get tested for HIV. You should then be tested every 3 months for as long as you are taking the medicine. Pregnancy  If you are about to stop having your period (premenopausal) and you may become pregnant, seek counseling before you get pregnant.  Take 400 to 800 micrograms (mcg) of folic acid every day if you become pregnant.  Ask for birth control (contraception) if you want to prevent pregnancy. Osteoporosis and menopause Osteoporosis is a disease in which the bones lose minerals and strength with aging. This can result in bone fractures. If you are 60 years old or older, or if you are at risk for osteoporosis and fractures, ask your health care provider if you should:  Be screened for bone loss.  Take a calcium or vitamin D supplement to lower your risk of fractures.  Be given hormone replacement therapy (HRT) to treat symptoms of menopause. Follow these instructions at home: Lifestyle  Do not use any products that contain nicotine or tobacco, such as cigarettes, e-cigarettes, and chewing tobacco. If you need help quitting, ask your health care provider.  Do not use street drugs.  Do not share needles.  Ask your health care provider for help if you need support or information about quitting drugs. Alcohol use  Do not drink alcohol if: ? Your health care provider tells you not to drink. ? You are pregnant, may be pregnant, or are planning to become pregnant.  If you drink alcohol: ? Limit how much you use to 0-1 drink a day. ? Limit intake if you are breastfeeding.  Be aware of how much  alcohol is in your drink. In the [place] one drink equals one 12 oz bottle of beer (355 mL), one 5 oz glass of wine (148 mL), or one 1 oz glass of hard liquor (44 mL). General instructions  Schedule regular health, dental, and eye exams.  Stay current with your vaccines.  Tell your health care provider if: ? You often feel depressed. ? You have ever been abused or do not feel safe at home. Summary  Adopting a healthy lifestyle and getting preventive care are important in promoting health and wellness.  Follow your health care provider's instructions about healthy diet, exercising, and getting tested or screened for diseases.  Follow your health care provider's instructions on monitoring your cholesterol and blood pressure. This information is not intended to replace advice given to you by your health care provider. Make sure you discuss any questions you have with your health care provider. Document Revised: 07/20/2018 Document Reviewed: 07/20/2018  Elsevier Patient Education  [employer].

## 2019-12-20 NOTE — Progress Notes (Signed)
Chronic Care Management Pharmacy  Name: Alexandra [name]  MRN: [id number] DOB: Jan 06, 1951  Chief Complaint/ HPI  [name],  69 y.o. , female presents for their Initial CCM visit with the clinical pharmacist via telephone due to COVID-19 Pandemic.  PCP : [name], MD  Their chronic conditions include: HLD, HTN, Osteopenia, Obesity.  Office Visits: 8/25 (PCP): PE. Pt's PE WNL w/ exception of weight.  UTD on mammo.  Due for repeat colonoscopy later this year- referral placed.  Pneumovax and flu shot given.   Past Medical History:  Diagnosis Date  . Cataract    REMOVED BOTH - LENS IMPLANTS  . Depression   . Family history of ovarian cancer   . Glaucoma   . Hyperlipidemia   . Hypertension   . OSA (obstructive sleep apnea) 10/05/2017  . Osteopenia   . Sleep apnea    WEARS CPAP   Family History  Problem Relation Age of Onset  . Heart attack Father   . Lung cancer Sister 45       smoker  . Throat cancer Brother 104       smoker  . Ovarian cancer Mother 64  . Esophageal cancer Maternal Uncle   . Lung cancer Maternal Grandfather        dx in his 40s  . Hodgkin's lymphoma Other 16  . Colon cancer Neg Hx   . Colon polyps Neg Hx   . Rectal cancer Neg Hx   . Stomach cancer Neg Hx     Social History   Socioeconomic History  . Marital status: Married    Spouse name: Not on file  . Number of children: 2  . Years of education: Not on file  . Highest education level: Not on file  Occupational History  . Not on file  Tobacco Use  . Smoking status: Never Smoker  . Smokeless tobacco: Never Used  Substance and Sexual Activity  . Alcohol use: Yes    Alcohol/week: 2.0 standard drinks    Types: 2 Glasses of wine per week  . Drug use: No  . Sexual activity: Not on file  Other Topics Concern  . Not on file  Social History Narrative  . Not on file   Social Determinants of Health   Financial Resource Strain:   . Difficulty of Paying Living Expenses:   Food  Insecurity: No Food Insecurity  . Worried About Charity fundraiser in the Last Year: Never true  . Ran Out of Food in the Last Year: Never true  Transportation Needs: No Transportation Needs  . Lack of Transportation (Medical): No  . Lack of Transportation (Non-Medical): No  Physical Activity:   . Days of Exercise per Week:   . Minutes of Exercise per Session:   Stress:   . Feeling of Stress :   Social Connections:   . Frequency of Communication with Friends and Family:   . Frequency of Social Gatherings with Friends and Family:   . Attends Religious Services:   . Active Member of Clubs or Organizations:   . Attends Archivist Meetings:   Marland Kitchen Marital Status:    No Known Allergies  Outpatient Encounter Medications as of 12/20/2019  Medication Sig  . Ascorbic Acid (VITAMIN C) 1000 MG tablet Take 1,000 mg by mouth daily.  Marland Kitchen aspirin EC 81 MG tablet Take 81 mg by mouth daily.  . B Complex-C (SUPER B COMPLEX PO) Take by mouth.  . brimonidine (ALPHAGAN)  0.2 % ophthalmic solution   . Calcium Carbonate-Vit D-Min (CALCIUM 1200 PO) Take by mouth.  . Cholecalciferol (VITAMIN D) 2000 units tablet Take 2,000 Units by mouth daily.  . dorzolamide-timolol (COSOPT) 22.3-6.8 MG/ML ophthalmic solution   . Flaxseed, Linseed, (FLAXSEED OIL PO) Take 1,400 mg by mouth 2 (two) times daily.  . Glucosamine-Chondroit-Vit C-Mn (GLUCOSAMINE 1500 COMPLEX) CAPS Take by mouth.  . hydrochlorothiazide (HYDRODIURIL) 12.5 MG tablet TAKE 1 TABLET BY MOUTH  DAILY  . LUMIGAN 0.01 % SOLN   . meloxicam (MOBIC) 15 MG tablet TAKE 1 TABLET BY MOUTH  DAILY  . Multiple Vitamins-Minerals (MULTIVITAMIN ADULT PO) Take by mouth.  . RHOPRESSA 0.02 % SOLN   . ROCKLATAN 0.02-0.005 % SOLN   . simvastatin (ZOCOR) 20 MG tablet TAKE 1 TABLET BY MOUTH AT  BEDTIME  . vitamin E 400 UNIT capsule Take 400 Units by mouth daily.  Marland Kitchen zolpidem (AMBIEN CR) 12.5 MG CR tablet Take 1 tablet (12.5 mg total) by mouth at bedtime as needed for  sleep.  Marland Kitchen tretinoin (RETIN-A) 0.05 % cream Apply topically at bedtime.  . triamcinolone cream (KENALOG) 0.1 %    No facility-administered encounter medications on file as of 12/20/2019.   Current Diagnosis/Assessment: Goals Addressed            This Visit's Progress   . PharmD Care Plan       CARE PLAN ENTRY  Current Barriers:  . Chronic Disease Management support, education, and care coordination needs related to:  Marland Kitchen Essential hypertension .  Hyperlipidemia, unspecified hyperlipidemia type .  Osteopenia, unspecified location .  Obesity (BMI 30-39.9)     Hypertension . Pharmacist Clinical Goal(s): o Over the next 180 days, patient will work with PharmD and providers to maintain BP goal <130/80 . Current regimen:  o Hydrochlorothiazide 12.5 mg once daily . Interventions: o Continue current management . Patient self care activities - Over the next 180 days, patient will: o Check BP daily, document, and provide at future appointments - Consider purchasing home blood pressure monitor with upper arm cuff o Ensure daily salt intake < 2300 mg/day  Hyperlipidemia . Pharmacist Clinical Goal(s): o Over the next 180 days, patient will work with PharmD and providers to maintain LDL goal < 100 . Current regimen:  o Simvastatin 20 mg once daily  . Interventions: o Continue current management . Patient self care activities - Over the next 180 days, patient will: o Continue current medication along with diet/exercise  Weight loss . Pharmacist Clinical Goal(s) o Over the next 180 days, patient will work with PharmD and providers to maintain healthy body weight. . Current regimen:  o Daily exercise, including fitness class 2x/day and walking.  o Avoidance of chocolate/sweets o Portion control  . Interventions: o None  . Patient self care activities - Over the next 180 days, patient will: o Keep up the hard work! Continue to focus on: - Daily exercise, including fitness class  2x/day and walking.  - Avoidance of chocolate/sweets - Portion control  Osteopenia . Pharmacist Clinical Goal(s) o Over the next 180 days, patient will work with PharmD and providers to minimize risk of major fractures . Current regimen:  o Vitamin d 2000 units daily o Calcium 1200 mg daily  . Interventions: o Continue current management . Patient self care activities - Over the next 180 days, patient will: o Continue vitamin d/calcium supplementation.  o Continue weight-bearing and muscle strengthening exercises for building and maintaining bone density.  Medication management .  Pharmacist Clinical Goal(s): o Over the next 180 days, patient will work with PharmD and providers to maintain optimal medication adherence . Current pharmacy: [facility] . Interventions o Comprehensive medication review performed. o Continue current medication management strategy . Patient self care activities - Over the next 180 days, patient will: o Focus on medication adherence by continuing current management o Take medications as prescribed o Report any questions or concerns to PharmD and/or provider(s) Initial goal documentation.      Hypertension   BP today is 103/62, pulse 63.  Office blood pressures are  BP Readings from Last 3 Encounters:  11/03/19 (!) 106/53  07/14/19 110/68  04/04/19 118/74   Patient checks BP at home daily. Patient home BP readings are ranging: ~110/50s-60s. Encourage use BP monitor with upper arm cuff, currently using a wrist cuff. We discussed diet and exercise extensively.   Patient is currently controlled on the following medications: hctz 12.5 mg daily.   Plan  Continue current medications and control with diet and exercise.    Hyperlipidemia   Lipid Panel     Component Value Date/Time   CHOL 175 11/03/2019 1334   TRIG 138.0 11/03/2019 1334   HDL 72.40 11/03/2019 1334   CHOLHDL 2 11/03/2019 1334   VLDL 27.6 11/03/2019 1334   LDLCALC  75 11/03/2019 1334   LDLDIRECT 108.0 08/11/2017 1033    The 10-year ASCVD risk score Mikey Bussing DC Jr., et al., 2013) is: 5.5%   Values used to calculate the score:     Age: 3 years     Sex: Female     Is Non-Hispanic African American: No     Diabetic: No     Tobacco smoker: No     Systolic Blood Pressure: [id number] mmHg     Is BP treated: Yes     HDL Cholesterol: 72.4 mg/dL     Total Cholesterol: 175 mg/dL   Patient has been on these meds in past: fenofibrate 160 mg daily. Patient is currently controlledon the following medications: simvastatin 20 mg daily. Denies any muscle or abdominal pain or n/v.   Plan  Continue current medications and control with diet and exercise.   Obesity    Exercises nearly every day - fitness classes 2x/day plus walking.    We discussed diet recommendations - mentions that avoiding chocolates/sweets and portion control have been the most challenging part of her diet.   Plan  Continue control with diet and exercise.   Sleep   Patient is currently controlled on zolpidem cr 12.5 mg daily. Reports continued benefit of medication however has been splitting the controlled release tablet lately and notes feeling a "hangover" effect the last time she split it. Is only using 1-2x/month at the most.   Zolpidem cr tabs should not be split/crushed. Expresses interest in switching to zolpidem CR 6.25 mg tablets.   Plan  Recommend stop zolpidem cr 12.5 mg and start zolpidem cr 6.25 mg at night as needed.    Osteopenia   VITD  Date Value Ref Range Status  04/04/2019 38.95 30.00 - 100.00 ng/mL Final   Last DEXA Scan: 07/10/2019 - Osteopenic  Major Osteoporotic Fracture: 13.9% Hip Fracture: 1.4%  Patient is not a candidate for pharmacologic treatment. Patient is currently controlled on the following medications:   Vitamin d 2000 units daily   Calcium 1200 mg daily   We discussed weight-bearing and muscle strengthening exercises for building and maintaining  bone density.  Plan  Continue current medications and  control with diet and exercise  Vaccines   Reviewed and discussed patient's vaccination history.  Due for Shingrix vaccine.   Immunization History  Administered Date(s) Administered  . Influenza Whole 06/30/2007  . Influenza,inj,Quad PF,6+ Mos 05/12/2013, 05/14/2014, 06/01/2014, 06/10/2015, 06/17/2016, 05/12/2017, 05/18/2018, 04/04/2019  . PFIZER SARS-COV-2 Vaccination 08/30/2019, 09/20/2019  . Pneumococcal Conjugate-13 08/11/2017  . Pneumococcal Polysaccharide-23 04/04/2019  . Td 09/11/2006  . Tdap 12/15/2016  . Zoster 05/08/2011   Plan  Recommended patient receive Shingrix vaccine in pharmacy.   Medication Management   No issues with current medication management. Currently receives medications from:  [place], [place] [address] Phone: [phone] Fax: [place], [place] [address] [place] [address] Phone: [phone] Fax: [phone]  Plan  Utilize [facility] for medication synchronization, packaging and delivery.  Follow up: 6 month phone visit ______________ Visit Information SDOH (Social Determinants of Health) assessments performed: Yes.  Ms. Rehm was given information about Chronic Care Management services today including:  1. CCM service includes personalized support from designated clinical staff supervised by her physician, including individualized plan of care and coordination with other care providers 2. 24/7 contact phone numbers for assistance for urgent and routine care needs. 3. Standard insurance, coinsurance, copays and deductibles apply for chronic care management only during months in which we provide at least 20 minutes of these services. Most insurances cover these services at 100%, however patients may be responsible for  any copay, coinsurance and/or deductible if applicable. This service may help you avoid the need for more expensive face-to-face services. 4. Only one practitioner may furnish and bill the service in a calendar month. 5. The patient may stop CCM services at any time (effective at the end of the month) by phone call to the office staff.  Patient agreed to services and verbal consent obtained.   [name], Pharm.D., BCGP Clinical Pharmacist [facility] [phone]

## 2020-01-15 ENCOUNTER — Other Ambulatory Visit: Payer: Self-pay

## 2020-01-15 ENCOUNTER — Ambulatory Visit
Admission: RE | Admit: 2020-01-15 | Discharge: 2020-01-15 | Disposition: A | Discharge status: Home / Self Care | Payer: Medicare Other | Source: Ambulatory Visit | Attending: Family Medicine | Admitting: Family Medicine

## 2020-01-15 DIAGNOSIS — Z1231 Encounter for screening mammogram for malignant neoplasm of breast: Secondary | ICD-10-CM | POA: Diagnosis not present

## 2020-01-25 DIAGNOSIS — G4733 Obstructive sleep apnea (adult) (pediatric): Secondary | ICD-10-CM | POA: Diagnosis not present

## 2020-02-21 DIAGNOSIS — H401113 Primary open-angle glaucoma, right eye, severe stage: Secondary | ICD-10-CM | POA: Diagnosis not present

## 2020-02-21 DIAGNOSIS — H401122 Primary open-angle glaucoma, left eye, moderate stage: Secondary | ICD-10-CM | POA: Diagnosis not present

## 2020-02-21 DIAGNOSIS — Z961 Presence of intraocular lens: Secondary | ICD-10-CM | POA: Diagnosis not present

## 2020-02-21 DIAGNOSIS — H43813 Vitreous degeneration, bilateral: Secondary | ICD-10-CM | POA: Diagnosis not present

## 2020-02-21 DIAGNOSIS — H53032 Strabismic amblyopia, left eye: Secondary | ICD-10-CM | POA: Diagnosis not present

## 2020-03-13 DIAGNOSIS — H401113 Primary open-angle glaucoma, right eye, severe stage: Secondary | ICD-10-CM | POA: Diagnosis not present

## 2020-03-26 DIAGNOSIS — L570 Actinic keratosis: Secondary | ICD-10-CM | POA: Diagnosis not present

## 2020-03-26 DIAGNOSIS — D0472 Carcinoma in situ of skin of left lower limb, including hip: Secondary | ICD-10-CM | POA: Diagnosis not present

## 2020-03-26 DIAGNOSIS — D485 Neoplasm of uncertain behavior of skin: Secondary | ICD-10-CM | POA: Diagnosis not present

## 2020-04-03 DIAGNOSIS — H401113 Primary open-angle glaucoma, right eye, severe stage: Secondary | ICD-10-CM | POA: Diagnosis not present

## 2020-04-03 DIAGNOSIS — Z961 Presence of intraocular lens: Secondary | ICD-10-CM | POA: Diagnosis not present

## 2020-04-03 DIAGNOSIS — H43813 Vitreous degeneration, bilateral: Secondary | ICD-10-CM | POA: Diagnosis not present

## 2020-04-03 DIAGNOSIS — H53032 Strabismic amblyopia, left eye: Secondary | ICD-10-CM | POA: Diagnosis not present

## 2020-04-03 DIAGNOSIS — H401122 Primary open-angle glaucoma, left eye, moderate stage: Secondary | ICD-10-CM | POA: Diagnosis not present

## 2020-04-21 DIAGNOSIS — Z20822 Contact with and (suspected) exposure to covid-19: Secondary | ICD-10-CM | POA: Diagnosis not present

## 2020-05-10 NOTE — Progress Notes (Signed)
Subjective:   Alexandra [name] is a 69 y.o. female who presents for Medicare Annual (Subsequent) preventive examination.  I connected with Talin today by telephone and verified that I am speaking with the correct person using two identifiers. Location patient: home Location provider: work Persons participating in the virtual visit: patient, Marine scientist.    I discussed the limitations, risks, security and privacy concerns of performing an evaluation and management service by telephone and the availability of in person appointments. I also discussed with the patient that there may be a patient responsible charge related to this service. The patient expressed understanding and verbally consented to this telephonic visit.    Interactive audio and video telecommunications were attempted between this provider and patient, however failed, due to patient having technical difficulties OR patient did not have access to video capability.  We continued and completed visit with audio only.  Some vital signs may be absent or patient reported.   Time Spent with patient on telephone encounter: 20 minutes  Review of Systems     Cardiac Risk Factors include: advanced age (>75men, >69 women);hypertension;dyslipidemia;obesity (BMI >30kg/m2)     Objective:    Today's Vitals   05/13/20 1112  Weight: 170 lb (77.1 kg)  Height: 5\' 3"  (1.6 m)   Body mass index is 30.11 kg/m.  Advanced Directives 05/13/2020 12/24/2017  Does Patient Have a Medical Advance Directive? No No  Would patient like information on creating a medical advance directive? Yes (MAU/Ambulatory/Procedural Areas - Information given) Yes (MAU/Ambulatory/Procedural Areas - Information given)    Current Medications (verified) Outpatient Encounter Medications as of 05/13/2020  Medication Sig  . acetaZOLAMIDE (DIAMOX) 500 MG capsule Take 500 mg by mouth 2 (two) times daily.  . Ascorbic Acid (VITAMIN C) 1000 MG tablet Take 1,000 mg by mouth daily.  Marland Kitchen  aspirin EC 81 MG tablet Take 81 mg by mouth daily.  . B Complex-C (SUPER B COMPLEX PO) Take by mouth.  . brimonidine (ALPHAGAN) 0.2 % ophthalmic solution   . Calcium Carbonate-Vit D-Min (CALCIUM 1200 PO) Take by mouth.  . Cholecalciferol (VITAMIN D) 2000 units tablet Take 2,000 Units by mouth daily.  . dorzolamide-timolol (COSOPT) 22.3-6.8 MG/ML ophthalmic solution   . Glucosamine-Chondroit-Vit C-Mn (GLUCOSAMINE 1500 COMPLEX) CAPS Take by mouth.  . hydrochlorothiazide (HYDRODIURIL) 12.5 MG tablet TAKE 1 TABLET BY MOUTH  DAILY  . LUMIGAN 0.01 % SOLN   . meloxicam (MOBIC) 15 MG tablet TAKE 1 TABLET BY MOUTH  DAILY  . Multiple Vitamins-Minerals (MULTIVITAMIN ADULT PO) Take by mouth.  . RHOPRESSA 0.02 % SOLN   . simvastatin (ZOCOR) 20 MG tablet TAKE 1 TABLET BY MOUTH AT  BEDTIME  . tretinoin (RETIN-A) 0.05 % cream Apply topically at bedtime.  . vitamin E 400 UNIT capsule Take 400 Units by mouth daily.  Marland Kitchen zolpidem (AMBIEN CR) 6.25 MG CR tablet Take 1 tablet (6.25 mg total) by mouth at bedtime as needed for sleep.  . Flaxseed, Linseed, (FLAXSEED OIL PO) Take 1,400 mg by mouth 2 (two) times daily.  Marland Kitchen ROCKLATAN 0.02-0.005 % SOLN   . triamcinolone cream (KENALOG) 0.1 %    No facility-administered encounter medications on file as of 05/13/2020.    Allergies (verified) Patient has no known allergies.   History: Past Medical History:  Diagnosis Date  . Cataract    REMOVED BOTH - LENS IMPLANTS  . Depression   . Family history of ovarian cancer   . Glaucoma   . Hyperlipidemia   . Hypertension   .  OSA (obstructive sleep apnea) 10/05/2017  . Osteopenia   . Sleep apnea    WEARS CPAP   Past Surgical History:  Procedure Laterality Date  . CATARACT EXTRACTION     11/04/17  . COLONOSCOPY    . DERMOID CYST  EXCISION    . ELBOW FRACTURE SURGERY  2004   Family History  Problem Relation Age of Onset  . Heart attack Father   . Lung cancer Sister 59       smoker  . Throat cancer Brother 51        smoker  . Ovarian cancer Mother 54  . Esophageal cancer Maternal Uncle   . Lung cancer Maternal Grandfather        dx in his 60s  . Hodgkin's lymphoma Other 16  . Colon cancer Neg Hx   . Colon polyps Neg Hx   . Rectal cancer Neg Hx   . Stomach cancer Neg Hx    Social History   Socioeconomic History  . Marital status: Married    Spouse name: Not on file  . Number of children: 2  . Years of education: Not on file  . Highest education level: Not on file  Occupational History  . Occupation: retired  Tobacco Use  . Smoking status: Never Smoker  . Smokeless tobacco: Never Used  Vaping Use  . Vaping Use: Never used  Substance and Sexual Activity  . Alcohol use: Yes    Alcohol/week: 2.0 standard drinks    Types: 2 Glasses of wine per week  . Drug use: No  . Sexual activity: Not on file  Other Topics Concern  . Not on file  Social History Narrative  . Not on file   Social Determinants of Health   Financial Resource Strain: Low Risk   . Difficulty of Paying Living Expenses: Not hard at all  Food Insecurity: No Food Insecurity  . Worried About Charity fundraiser in the Last Year: Never true  . Ran Out of Food in the Last Year: Never true  Transportation Needs: No Transportation Needs  . Lack of Transportation (Medical): No  . Lack of Transportation (Non-Medical): No  Physical Activity: Sufficiently Active  . Days of Exercise per Week: 7 days  . Minutes of Exercise per Session: 90 min  Stress: No Stress Concern Present  . Feeling of Stress : Not at all  Social Connections: Moderately Integrated  . Frequency of Communication with Friends and Family: More than three times a week  . Frequency of Social Gatherings with Friends and Family: Once a week  . Attends Religious Services: More than 4 times per year  . Active Member of Clubs or Organizations: No  . Attends Archivist Meetings: Never  . Marital Status: Married    Tobacco Counseling Counseling given:  Not Answered   Clinical Intake:  Pre-visit preparation completed: Yes  Pain : No/denies pain     Nutritional Status: BMI > 30  Obese Nutritional Risks: None Diabetes: No  How often do you need to have someone help you when you read instructions, pamphlets, or other written materials from your doctor or pharmacy?: 1 - Never What is the last grade level you completed in school?: 2 yrs of college  Diabetic?No  Interpreter Needed?: No  Information entered by :: Caroleen Hamman LPN   Activities of Daily Living In your present state of health, do you have any difficulty performing the following activities: 05/13/2020 11/03/2019  Hearing? N N  Vision?  N N  Difficulty concentrating or making decisions? N N  Walking or climbing stairs? N N  Dressing or bathing? N N  Doing errands, shopping? N N  Preparing Food and eating ? N -  Using the Toilet? N -  In the past six months, have you accidently leaked urine? N -  Do you have problems with loss of bowel control? N -  Managing your Medications? N -  Managing your Finances? N -  Housekeeping or managing your Housekeeping? N -  Some recent data might be hidden    Patient Care Team: [name], MD as PCP - General [name], [facility] as Pharmacist (Pharmacist)  Indicate any recent Medical Services you may have received from other than [facility] providers in the past year (date may be approximate).     Assessment:   This is a routine wellness examination for [place].  Hearing/Vision screen  Hearing Screening   125Hz  250Hz  500Hz  1000Hz  2000Hz  3000Hz  4000Hz  6000Hz  8000Hz   Right ear:           Left ear:           Comments: No issues  Vision Screening Comments: Glaucoma Last eye exam- 2021   Dr Katy Fitch  Dietary issues and exercise activities discussed: Current Exercise Habits: Home exercise routine, Time (Minutes): 60, Frequency (Times/Week): 7, Weekly Exercise (Minutes/Week): 420, Intensity: Moderate, Exercise limited by: None  identified  Goals    . Patient Stated     Maintain current level of activity    . PharmD Care Plan     CARE PLAN ENTRY  Current Barriers:  . Chronic Disease Management support, education, and care coordination needs related to:  Marland Kitchen Essential hypertension .  Hyperlipidemia, unspecified hyperlipidemia type .  Osteopenia, unspecified location .  Obesity (BMI 30-39.9)     Hypertension . Pharmacist Clinical Goal(s): o Over the next 180 days, patient will work with PharmD and providers to maintain BP goal <130/80 . Current regimen:  o Hydrochlorothiazide 12.5 mg once daily . Interventions: o Continue current management . Patient self care activities - Over the next 180 days, patient will: o Check BP daily, document, and provide at future appointments - Consider purchasing home blood pressure monitor with upper arm cuff o Ensure daily salt intake < 2300 mg/day  Hyperlipidemia . Pharmacist Clinical Goal(s): o Over the next 180 days, patient will work with PharmD and providers to maintain LDL goal < 100 . Current regimen:  o Simvastatin 20 mg once daily  . Interventions: o Continue current management . Patient self care activities - Over the next 180 days, patient will: o Continue current medication along with diet/exercise  Weight loss . Pharmacist Clinical Goal(s) o Over the next 180 days, patient will work with PharmD and providers to maintain healthy body weight. . Current regimen:  o Daily exercise, including fitness class 2x/day and walking.  o Avoidance of chocolate/sweets o Portion control  . Interventions: o None  . Patient self care activities - Over the next 180 days, patient will: o Keep up the hard work! Continue to focus on: - Daily exercise, including fitness class 2x/day and walking.  - Avoidance of chocolate/sweets - Portion control  Osteopenia . Pharmacist Clinical Goal(s) o Over the next 180 days, patient will work with PharmD and providers to minimize  risk of major fractures . Current regimen:  o Vitamin d 2000 units daily o Calcium 1200 mg daily  . Interventions: o Continue current management . Patient self care  activities - Over the next 180 days, patient will: o Continue vitamin d/calcium supplementation.  o Continue weight-bearing and muscle strengthening exercises for building and maintaining bone density.  Medication management . Pharmacist Clinical Goal(s): o Over the next 180 days, patient will work with PharmD and providers to maintain optimal medication adherence . Current pharmacy: [facility] mail order . Interventions o Comprehensive medication review performed. o Continue current medication management strategy . Patient self care activities - Over the next 180 days, patient will: o Focus on medication adherence by continuing current management o Take medications as prescribed o Report any questions or concerns to PharmD and/or provider(s) Initial goal documentation.      Depression Screen PHQ 2/9 Scores 05/13/2020 11/03/2019 04/04/2019 05/18/2018 08/11/2017 12/15/2016 06/17/2016  PHQ - 2 Score 0 0 0 0 0 0 0  PHQ- 9 Score - 0 0 0 0 0 0    Fall Risk Fall Risk  05/13/2020 11/03/2019 04/04/2019 05/18/2018 12/24/2017  Falls in the past year? 0 0 0 Yes No  Number falls in past yr: 0 0 0 1 -  Injury with Fall? 0 0 0 Yes -  Follow up Falls prevention discussed Falls evaluation completed - Falls prevention discussed -    Any stairs in or around the home? Yes  If so, are there any without handrails? No  Home free of loose throw rugs in walkways, pet beds, electrical cords, etc? Yes  Adequate lighting in your home to reduce risk of falls? Yes   ASSISTIVE DEVICES UTILIZED TO PREVENT FALLS:  Life alert? No  Use of a cane, walker or w/c? No  Grab bars in the bathroom? Yes  Shower chair or bench in shower? No  Elevated toilet seat or a handicapped toilet? No   TIMED UP AND GO:  Was the test performed? No . Phone  visit  Cognitive Function:No cognitive impairment noted.        Immunizations Immunization History  Administered Date(s) Administered  . Influenza Whole 06/30/2007  . Influenza,inj,Quad PF,6+ Mos 05/12/2013, 05/14/2014, 06/01/2014, 06/10/2015, 06/17/2016, 05/12/2017, 05/18/2018, 04/04/2019  . PFIZER SARS-COV-2 Vaccination 08/30/2019, 09/20/2019  . Pneumococcal Conjugate-13 08/11/2017  . Pneumococcal Polysaccharide-23 04/04/2019  . Td 09/11/2006  . Tdap 12/15/2016  . Zoster 05/08/2011    TDAP status: Up to date   Flu Vaccine Status: Patient plans to get vaccine at her office visit next week.  Pneumococcal vaccine status: Up to date   Covid-19 vaccine status: Completed vaccines  Qualifies for Shingles Vaccine? Yes   Zostavax completed Yes   Shingrix Completed?: No.    Education has been provided regarding the importance of this vaccine. Patient has been advised to call insurance company to determine out of pocket expense if they have not yet received this vaccine. Advised may also receive vaccine at local pharmacy or Health Dept. Verbalized acceptance and understanding.  Screening Tests Health Maintenance  Topic Date Due  . INFLUENZA VACCINE  03/10/2020  . DEXA SCAN  06/12/2021  . MAMMOGRAM  01/14/2022  . COLONOSCOPY  07/13/2026  . TETANUS/TDAP  12/16/2026  . COVID-19 Vaccine  Completed  . Hepatitis C Screening  Completed  . PNA vac Low Risk Adult  Completed    Health Maintenance  Health Maintenance Due  Topic Date Due  . INFLUENZA VACCINE  03/10/2020    Colorectal cancer screening: Completed 07/14/2019. Repeat every 7 years   Mammogram status: Completed 01/15/2020. Repeat every year   Bone Density status: Completed 06/13/2019. Results reflect: Bone density results: OSTEOPENIA.  Repeat every 2 years.  Lung Cancer Screening: (Low Dose CT Chest recommended if Age 67-80 years, 30 pack-year currently smoking OR have quit w/in 15years.) does not qualify.    Additional  Screening:  Hepatitis C Screening: Completed 08/11/2017  Vision Screening: Recommended annual ophthalmology exams for early detection of glaucoma and other disorders of the eye. Is the patient up to date with their annual eye exam?  Yes  Who is the provider or what is the name of the office in which the patient attends annual eye exams? Dr. Katy Fitch   Dental Screening: Recommended annual dental exams for proper oral hygiene  Community Resource Referral / Chronic Care Management: CRR required this visit?  No   CCM required this visit?  No      Plan:     I have personally reviewed and noted the following in the patient's chart:   . Medical and social history . Use of alcohol, tobacco or illicit drugs  . Current medications and supplements . Functional ability and status . Nutritional status . Physical activity . Advanced directives . List of other physicians . Hospitalizations, surgeries, and ER visits in previous 12 months . Vitals . Screenings to include cognitive, depression, and falls . Referrals and appointments  In addition, I have reviewed and discussed with patient certain preventive protocols, quality metrics, and best practice recommendations. A written personalized care plan for preventive services as well as general preventive health recommendations were provided to patient.    Due to this being a telephonic visit, the after visit summary with patients personalized plan was offered to patient via mail or my-chart. Patient would like to access on my-chart.    Marta Antu, LPN   18/12/6312  Nurse Health Advisor  Nurse Notes: None

## 2020-05-13 ENCOUNTER — Ambulatory Visit (INDEPENDENT_AMBULATORY_CARE_PROVIDER_SITE_OTHER): Payer: Medicare Other

## 2020-05-13 VITALS — Ht 63.0 in | Wt 170.0 lb

## 2020-05-13 DIAGNOSIS — Z0000 Encounter for general adult medical examination without abnormal findings: Secondary | ICD-10-CM

## 2020-05-13 NOTE — Patient Instructions (Signed)
Alexandra [name] , Thank you for taking time to complete your Medicare Wellness Visit. I appreciate your ongoing commitment to your health goals. Please review the following plan we discussed and let me know if I can assist you in the future.   Screening recommendations/referrals: Colonoscopy: Completed 07/14/2019- Due- 07/13/2026 Mammogram: Completed 01/15/2020- Due 01/14/2021 Bone Density: Completed 06/13/2019- Due 06/12/2021 Recommended yearly ophthalmology/optometry visit for glaucoma screening and checkup Recommended yearly dental visit for hygiene and checkup  Vaccinations: Influenza vaccine: Due Pneumococcal vaccine: Completed  vaccines Tdap vaccine: Up to date-Due-12/16/2026 Shingles vaccine: Discuss with pharmacy   Covid-19:Completed vaccines  Advanced directives: Information mailed today  Conditions/risks identified: See problem list  Next appointment: Follow up in one year for your annual wellness visit    Preventive Care 68 Years and Older, Female Preventive care refers to lifestyle choices and visits with your health care provider that can promote health and wellness. What does preventive care include?  A yearly physical exam. This is also called an annual well check.  Dental exams once or twice a year.  Routine eye exams. Ask your health care provider how often you should have your eyes checked.  Personal lifestyle choices, including:  Daily care of your teeth and gums.  Regular physical activity.  Eating a healthy diet.  Avoiding tobacco and drug use.  Limiting alcohol use.  Practicing safe sex.  Taking low-dose aspirin every day.  Taking vitamin and mineral supplements as recommended by your health care provider. What happens during an annual well check? The services and screenings done by your health care provider during your annual well check will depend on your age, overall health, lifestyle risk factors, and family history of disease. Counseling  Your health  care provider may ask you questions about your:  Alcohol use.  Tobacco use.  Drug use.  Emotional well-being.  Home and relationship well-being.  Sexual activity.  Eating habits.  History of falls.  Memory and ability to understand (cognition).  Work and work Statistician.  Reproductive health. Screening  You may have the following tests or measurements:  Height, weight, and BMI.  Blood pressure.  Lipid and cholesterol levels. These may be checked every 5 years, or more frequently if you are over 66 years old.  Skin check.  Lung cancer screening. You may have this screening every year starting at age 40 if you have a 30-pack-year history of smoking and currently smoke or have quit within the past 15 years.  Fecal occult blood test (FOBT) of the stool. You may have this test every year starting at age 53.  Flexible sigmoidoscopy or colonoscopy. You may have a sigmoidoscopy every 5 years or a colonoscopy every 10 years starting at age 5.  Hepatitis C blood test.  Hepatitis B blood test.  Sexually transmitted disease (STD) testing.  Diabetes screening. This is done by checking your blood sugar (glucose) after you have not eaten for a while (fasting). You may have this done every 1-3 years.  Bone density scan. This is done to screen for osteoporosis. You may have this done starting at age 76.  Mammogram. This may be done every 1-2 years. Talk to your health care provider about how often you should have regular mammograms. Talk with your health care provider about your test results, treatment options, and if necessary, the need for more tests. Vaccines  Your health care provider may recommend certain vaccines, such as:  Influenza vaccine. This is recommended every year.  Tetanus, diphtheria, and acellular  pertussis (Tdap, Td) vaccine. You may need a Td booster every 10 years.  Zoster vaccine. You may need this after age 38.  Pneumococcal 13-valent conjugate  (PCV13) vaccine. One dose is recommended after age 37.  Pneumococcal polysaccharide (PPSV23) vaccine. One dose is recommended after age 42. Talk to your health care provider about which screenings and vaccines you need and how often you need them. This information is not intended to replace advice given to you by your health care provider. Make sure you discuss any questions you have with your health care provider. Document Released: 08/23/2015 Document Revised: 04/15/2016 Document Reviewed: 05/28/2015 Elsevier Interactive Patient Education  2017 [place] Prevention in the Home Falls can cause injuries. They can happen to people of all ages. There are many things you can do to make your home safe and to help prevent falls. What can I do on the outside of my home?  Regularly fix the edges of walkways and driveways and fix any cracks.  Remove anything that might make you trip as you walk through a door, such as a raised step or threshold.  Trim any bushes or trees on the path to your home.  Use bright outdoor lighting.  Clear any walking paths of anything that might make someone trip, such as rocks or tools.  Regularly check to see if handrails are loose or broken. Make sure that both sides of any steps have handrails.  Any raised decks and porches should have guardrails on the edges.  Have any leaves, snow, or ice cleared regularly.  Use sand or salt on walking paths during winter.  Clean up any spills in your garage right away. This includes oil or grease spills. What can I do in the bathroom?  Use night lights.  Install grab bars by the toilet and in the tub and shower. Do not use towel bars as grab bars.  Use non-skid mats or decals in the tub or shower.  If you need to sit down in the shower, use a plastic, non-slip stool.  Keep the floor dry. Clean up any water that spills on the floor as soon as it happens.  Remove soap buildup in the tub or shower  regularly.  Attach bath mats securely with double-sided non-slip rug tape.  Do not have throw rugs and other things on the floor that can make you trip. What can I do in the bedroom?  Use night lights.  Make sure that you have a light by your bed that is easy to reach.  Do not use any sheets or blankets that are too big for your bed. They should not hang down onto the floor.  Have a firm chair that has side arms. You can use this for support while you get dressed.  Do not have throw rugs and other things on the floor that can make you trip. What can I do in the kitchen?  Clean up any spills right away.  Avoid walking on wet floors.  Keep items that you use a lot in easy-to-reach places.  If you need to reach something above you, use a strong step stool that has a grab bar.  Keep electrical cords out of the way.  Do not use floor polish or wax that makes floors slippery. If you must use wax, use non-skid floor wax.  Do not have throw rugs and other things on the floor that can make you trip. What can I do with my stairs?  Do not leave any items on the stairs.  Make sure that there are handrails on both sides of the stairs and use them. Fix handrails that are broken or loose. Make sure that handrails are as long as the stairways.  Check any carpeting to make sure that it is firmly attached to the stairs. Fix any carpet that is loose or worn.  Avoid having throw rugs at the top or bottom of the stairs. If you do have throw rugs, attach them to the floor with carpet tape.  Make sure that you have a light switch at the top of the stairs and the bottom of the stairs. If you do not have them, ask someone to add them for you. What else can I do to help prevent falls?  Wear shoes that:  Do not have high heels.  Have rubber bottoms.  Are comfortable and fit you well.  Are closed at the toe. Do not wear sandals.  If you use a stepladder:  Make sure that it is fully  opened. Do not climb a closed stepladder.  Make sure that both sides of the stepladder are locked into place.  Ask someone to hold it for you, if possible.  Clearly mark and make sure that you can see:  Any grab bars or handrails.  First and last steps.  Where the edge of each step is.  Use tools that help you move around (mobility aids) if they are needed. These include:  Canes.  Walkers.  Scooters.  Crutches.  Turn on the lights when you go into a dark area. Replace any light bulbs as soon as they burn out.  Set up your furniture so you have a clear path. Avoid moving your furniture around.  If any of your floors are uneven, fix them.  If there are any pets around you, be aware of where they are.  Review your medicines with your doctor. Some medicines can make you feel dizzy. This can increase your chance of falling. Ask your doctor what other things that you can do to help prevent falls. This information is not intended to replace advice given to you by your health care provider. Make sure you discuss any questions you have with your health care provider. Document Released: 05/23/2009 Document Revised: 01/02/2016 Document Reviewed: 08/31/2014 Elsevier Interactive Patient Education  2017 [employer].

## 2020-05-15 ENCOUNTER — Ambulatory Visit (INDEPENDENT_AMBULATORY_CARE_PROVIDER_SITE_OTHER): Payer: Medicare Other | Admitting: Family Medicine

## 2020-05-15 ENCOUNTER — Encounter: Payer: Self-pay | Admitting: Family Medicine

## 2020-05-15 ENCOUNTER — Other Ambulatory Visit: Payer: Self-pay

## 2020-05-15 ENCOUNTER — Ambulatory Visit: Payer: Medicare Other

## 2020-05-15 VITALS — BP 110/60 | HR 79 | Temp 97.9°F | Resp 16 | Ht 64.0 in | Wt 170.0 lb

## 2020-05-15 DIAGNOSIS — I10 Essential (primary) hypertension: Secondary | ICD-10-CM

## 2020-05-15 DIAGNOSIS — Z0000 Encounter for general adult medical examination without abnormal findings: Secondary | ICD-10-CM

## 2020-05-15 DIAGNOSIS — E663 Overweight: Secondary | ICD-10-CM | POA: Diagnosis not present

## 2020-05-15 DIAGNOSIS — Z23 Encounter for immunization: Secondary | ICD-10-CM | POA: Diagnosis not present

## 2020-05-15 LAB — CBC WITH DIFFERENTIAL/PLATELET
Basophils Absolute: 0 10*3/uL (ref 0.0–0.1)
Basophils Relative: 0.6 % (ref 0.0–3.0)
Eosinophils Absolute: 0.1 10*3/uL (ref 0.0–0.7)
Eosinophils Relative: 2.7 % (ref 0.0–5.0)
HCT: 42.7 % (ref 36.0–46.0)
Hemoglobin: 14.3 g/dL (ref 12.0–15.0)
Lymphocytes Relative: 33.6 % (ref 12.0–46.0)
Lymphs Abs: 1.4 10*3/uL (ref 0.7–4.0)
MCHC: 33.5 g/dL (ref 30.0–36.0)
MCV: 91.6 fl (ref 78.0–100.0)
Monocytes Absolute: 0.4 10*3/uL (ref 0.1–1.0)
Monocytes Relative: 8.9 % (ref 3.0–12.0)
Neutro Abs: 2.3 10*3/uL (ref 1.4–7.7)
Neutrophils Relative %: 54.2 % (ref 43.0–77.0)
Platelets: 223 10*3/uL (ref 150.0–400.0)
RBC: 4.66 Mil/uL (ref 3.87–5.11)
RDW: 13.5 % (ref 11.5–15.5)
WBC: 4.2 10*3/uL (ref 4.0–10.5)

## 2020-05-15 LAB — HEPATIC FUNCTION PANEL
ALT: 15 U/L (ref 0–35)
AST: 19 U/L (ref 0–37)
Albumin: 4.3 g/dL (ref 3.5–5.2)
Alkaline Phosphatase: 38 U/L — ABNORMAL LOW (ref 39–117)
Bilirubin, Direct: 0.2 mg/dL (ref 0.0–0.3)
Total Bilirubin: 1.4 mg/dL — ABNORMAL HIGH (ref 0.2–1.2)
Total Protein: 6.6 g/dL (ref 6.0–8.3)

## 2020-05-15 LAB — LIPID PANEL
Cholesterol: 153 mg/dL (ref 0–200)
HDL: 61 mg/dL (ref >39.00)
LDL Cholesterol: 69 mg/dL (ref 0–99)
NonHDL: 92.45
Total CHOL/HDL Ratio: 3
Triglycerides: 116 mg/dL (ref 0.0–149.0)
VLDL: 23.2 mg/dL (ref 0.0–40.0)

## 2020-05-15 LAB — BASIC METABOLIC PANEL
BUN: 20 mg/dL (ref 6–23)
CO2: 25 mEq/L (ref 19–32)
Calcium: 9.9 mg/dL (ref 8.4–10.5)
Chloride: 107 mEq/L (ref 96–112)
Creatinine, Ser: 0.96 mg/dL (ref 0.40–1.20)
GFR: 60.84 mL/min (ref >60.00)
Glucose, Bld: 105 mg/dL — ABNORMAL HIGH (ref 70–99)
Potassium: 3.8 mEq/L (ref 3.5–5.1)
Sodium: 140 mEq/L (ref 135–145)

## 2020-05-15 LAB — TSH: TSH: 2.49 u[IU]/mL (ref 0.35–4.50)

## 2020-05-15 NOTE — Progress Notes (Signed)
° °  Subjective:    Patient ID: Alexandra [name], female    DOB: 1951/02/29, 69 y.o.   MRN: [id number]  HPI CPE- UTD on colonoscopy, mammo, DEXA.  UTD on pneumonia vaccines, Tdap, COVID.  Due for flu and COVID booster (scheduled 11/5)  Reviewed past medical, surgical, family and social histories.   Health Maintenance  Topic Date Due   INFLUENZA VACCINE  03/10/2020   DEXA SCAN  06/12/2021   MAMMOGRAM  01/14/2022   COLONOSCOPY  07/13/2026   TETANUS/TDAP  12/16/2026   COVID-19 Vaccine  Completed   Hepatitis C Screening  Completed   PNA vac Low Risk Adult  Completed      Review of Systems Patient reports no vision/ hearing changes, adenopathy,fever, weight change,  persistant/recurrent hoarseness , swallowing issues, chest pain, palpitations, edema, persistant/recurrent cough, hemoptysis, dyspnea (rest/exertional/paroxysmal nocturnal), gastrointestinal bleeding (melena, rectal bleeding), abdominal pain, significant heartburn, bowel changes, GU symptoms (dysuria, hematuria, incontinence), Gyn symptoms (abnormal  bleeding, pain),  syncope, focal weakness, memory loss, skin/hair/nail changes, abnormal bruising or bleeding, anxiety, or depression.   + numbness due to addition of Diamox  This visit occurred during the SARS-CoV-2 public health emergency.  Safety protocols were in place, including screening questions prior to the visit, additional usage of staff PPE, and extensive cleaning of exam room while observing appropriate contact time as indicated for disinfecting solutions.       Objective:   Physical Exam General Appearance:    Alert, cooperative, no distress, appears stated age  Head:    Normocephalic, without obvious abnormality, atraumatic  Eyes:    PERRL, conjunctiva/corneas clear, EOM's intact, fundi    benign, both eyes  Ears:    Normal TM's and external ear canals, both ears  Nose:   Deferred due to COVID  Throat:   Neck:   Supple, symmetrical, trachea midline, no  adenopathy;    Thyroid: no enlargement/tenderness/nodules  Back:     Symmetric, no curvature, ROM normal, no CVA tenderness  Lungs:     Clear to auscultation bilaterally, respirations unlabored  Chest Wall:    No tenderness or deformity   Heart:    Regular rate and rhythm, S1 and S2 normal, no murmur, rub   or gallop  Breast Exam:    Deferred to mammo  Abdomen:     Soft, non-tender, bowel sounds active all four quadrants,    no masses, no organomegaly  Genitalia:    Deferred  Rectal:    Extremities:   Extremities normal, atraumatic, no cyanosis or edema  Pulses:   2+ and symmetric all extremities  Skin:   Skin color, texture, turgor normal, no rashes or lesions  Lymph nodes:   Cervical, supraclavicular, and axillary nodes normal  Neurologic:   CNII-XII intact, normal strength, sensation and reflexes    throughout          Assessment & Plan:

## 2020-05-15 NOTE — Patient Instructions (Addendum)
Follow up in 6 months to recheck BP and cholesterol We'll notify you of your lab results and make any changes if needed Keep up the good work on healthy diet and regular exercise- you're doing great! Call with any questions or concerns Stay Safe!  Stay Healthy!! 

## 2020-05-15 NOTE — Addendum Note (Signed)
Addended by: Fritz Pickerel on: 05/15/2020 09:42 AM   Modules accepted: Orders

## 2020-05-15 NOTE — Assessment & Plan Note (Signed)
Pt is down 11 lbs since last visit.  Applauded her efforts at healthy diet and regular exercise.  Will follow.

## 2020-05-15 NOTE — Assessment & Plan Note (Signed)
Pt's PE WNL w/ exception of being overweight.  UTD on colonoscopy, mammo, COVID, Tdap, pneumonia.  Flu shot given today.  Check labs.  Anticipatory guidance provided.

## 2020-05-15 NOTE — Assessment & Plan Note (Signed)
Chronic problem.  Well controlled today.  Currently asymptomatic.  Check labs.  No anticipated med changes. °

## 2020-05-16 ENCOUNTER — Encounter: Payer: Self-pay | Admitting: Family Medicine

## 2020-05-30 DIAGNOSIS — L821 Other seborrheic keratosis: Secondary | ICD-10-CM | POA: Diagnosis not present

## 2020-05-30 DIAGNOSIS — Z23 Encounter for immunization: Secondary | ICD-10-CM | POA: Diagnosis not present

## 2020-05-30 DIAGNOSIS — D2272 Melanocytic nevi of left lower limb, including hip: Secondary | ICD-10-CM | POA: Diagnosis not present

## 2020-05-30 DIAGNOSIS — L570 Actinic keratosis: Secondary | ICD-10-CM | POA: Diagnosis not present

## 2020-05-30 DIAGNOSIS — D485 Neoplasm of uncertain behavior of skin: Secondary | ICD-10-CM | POA: Diagnosis not present

## 2020-05-30 DIAGNOSIS — D099 Carcinoma in situ, unspecified: Secondary | ICD-10-CM | POA: Diagnosis not present

## 2020-06-07 ENCOUNTER — Other Ambulatory Visit: Payer: Self-pay | Admitting: Family Medicine

## 2020-06-13 ENCOUNTER — Ambulatory Visit (INDEPENDENT_AMBULATORY_CARE_PROVIDER_SITE_OTHER): Payer: Medicare Other | Admitting: Physician Assistant

## 2020-06-13 ENCOUNTER — Encounter: Payer: Self-pay | Admitting: Physician Assistant

## 2020-06-13 ENCOUNTER — Other Ambulatory Visit: Payer: Self-pay

## 2020-06-13 VITALS — BP 112/70 | HR 55 | Temp 98.3°F | Resp 14 | Ht 64.0 in | Wt 172.0 lb

## 2020-06-13 DIAGNOSIS — M79651 Pain in right thigh: Secondary | ICD-10-CM

## 2020-06-13 MED ORDER — METHYLPREDNISOLONE 4 MG PO TBPK: ORAL_TABLET | ORAL | 0 refills | Status: DC

## 2020-06-13 MED ORDER — METHOCARBAMOL 500 MG PO TABS: 500.0000 mg | ORAL_TABLET | Freq: Every day | ORAL | 0 refills | Status: DC

## 2020-06-13 NOTE — Patient Instructions (Signed)
Please avoid heavy lifting and overexertion. Take a break from the gym routine for the next week Take the steroid pack as directed. Can use tylenol for breakthrough pain. Take the Robaxin in the evening. Continue use of heating pad.  Follow-up if symptoms are not resolving, new symptoms develop or anything worsens.

## 2020-06-13 NOTE — Progress Notes (Signed)
Patient presents to clinic today c/o few days of pain of outer R leg radiating down thigh and lover leg. Notes no pain with sitting and laying. Standing and ambulation are proving difficult for her. Denies numbness or weakness of lower extremity. Denies saddle anesthesia or change to bowel/bladder habits. Denies trauma or injury. Not noting much improvement with OTC Aleve or Ibuprofen.   Past Medical History:  Diagnosis Date  . Cataract    REMOVED BOTH - LENS IMPLANTS  . Depression   . Family history of ovarian cancer   . Glaucoma   . Hyperlipidemia   . Hypertension   . OSA (obstructive sleep apnea) 10/05/2017  . Osteopenia   . Sleep apnea    WEARS CPAP    Current Outpatient Medications on File Prior to Visit  Medication Sig Dispense Refill  . acetaZOLAMIDE (DIAMOX) 500 MG capsule Take 500 mg by mouth 2 (two) times daily.    . Ascorbic Acid (VITAMIN C) 1000 MG tablet Take 1,000 mg by mouth daily.    Marland Kitchen aspirin EC 81 MG tablet Take 81 mg by mouth daily.    . B Complex-C (SUPER B COMPLEX PO) Take by mouth.    . brimonidine (ALPHAGAN) 0.2 % ophthalmic solution   3  . Calcium Carbonate-Vit D-Min (CALCIUM 1200 PO) Take by mouth.    . Cholecalciferol (VITAMIN D) 2000 units tablet Take 2,000 Units by mouth daily.    . dorzolamide-timolol (COSOPT) 22.3-6.8 MG/ML ophthalmic solution   1  . Glucosamine-Chondroit-Vit C-Mn (GLUCOSAMINE 1500 COMPLEX) CAPS Take by mouth.    . hydrochlorothiazide (HYDRODIURIL) 12.5 MG tablet TAKE 1 TABLET BY MOUTH  DAILY 90 tablet 3  . LUMIGAN 0.01 % SOLN     . meloxicam (MOBIC) 15 MG tablet TAKE 1 TABLET BY MOUTH  DAILY 90 tablet 0  . Multiple Vitamins-Minerals (MULTIVITAMIN ADULT PO) Take by mouth.    . RHOPRESSA 0.02 % SOLN     . simvastatin (ZOCOR) 20 MG tablet TAKE 1 TABLET BY MOUTH AT  BEDTIME 90 tablet 1  . tretinoin (RETIN-A) 0.05 % cream Apply topically at bedtime.    . vitamin E 400 UNIT capsule Take 400 Units by mouth daily.    Marland Kitchen zolpidem (AMBIEN  CR) 6.25 MG CR tablet Take 1 tablet (6.25 mg total) by mouth at bedtime as needed for sleep. 30 tablet 1   No current facility-administered medications on file prior to visit.    No Known Allergies  Family History  Problem Relation Age of Onset  . Heart attack Father   . Lung cancer Sister 60       smoker  . Throat cancer Brother 55       smoker  . Ovarian cancer Mother 19  . Esophageal cancer Maternal Uncle   . Lung cancer Maternal Grandfather        dx in his 28s  . Hodgkin's lymphoma Other 16  . Colon cancer Neg Hx   . Colon polyps Neg Hx   . Rectal cancer Neg Hx   . Stomach cancer Neg Hx     Social History   Socioeconomic History  . Marital status: Married    Spouse name: Not on file  . Number of children: 2  . Years of education: Not on file  . Highest education level: Not on file  Occupational History  . Occupation: retired  Tobacco Use  . Smoking status: Never Smoker  . Smokeless tobacco: Never Used  Vaping Use  .  Vaping Use: Never used  Substance and Sexual Activity  . Alcohol use: Yes    Alcohol/week: 2.0 standard drinks    Types: 2 Glasses of wine per week  . Drug use: No  . Sexual activity: Not on file  Other Topics Concern  . Not on file  Social History Narrative  . Not on file   Social Determinants of Health   Financial Resource Strain: Low Risk   . Difficulty of Paying Living Expenses: Not hard at all  Food Insecurity: No Food Insecurity  . Worried About Charity fundraiser in the Last Year: Never true  . Ran Out of Food in the Last Year: Never true  Transportation Needs: No Transportation Needs  . Lack of Transportation (Medical): No  . Lack of Transportation (Non-Medical): No  Physical Activity: Sufficiently Active  . Days of Exercise per Week: 7 days  . Minutes of Exercise per Session: 90 min  Stress: No Stress Concern Present  . Feeling of Stress : Not at all  Social Connections: Moderately Integrated  . Frequency of Communication  with Friends and Family: More than three times a week  . Frequency of Social Gatherings with Friends and Family: Once a week  . Attends Religious Services: More than 4 times per year  . Active Member of Clubs or Organizations: No  . Attends Archivist Meetings: Never  . Marital Status: Married   Review of Systems - See HPI.  All other ROS are negative.  Wt 172 lb (78 kg)   BMI 29.52 kg/m   Physical Exam Vitals reviewed.  Constitutional:      Appearance: Normal appearance.  Musculoskeletal:     Thoracic back: Normal.     Lumbar back: Spasms and tenderness present. No bony tenderness. Normal range of motion. Negative right straight leg raise test and negative left straight leg raise test.  Neurological:     Mental Status: She is alert.     Recent Results (from the past 2160 hour(s))  Lipid panel     Status: None   Collection Time: 05/15/20  9:40 AM  Result Value Ref Range   Cholesterol 153 0 - 200 mg/dL    Comment: ATP III Classification       Desirable:  < 200 mg/dL               Borderline High:  200 - 239 mg/dL          High:  > = 240 mg/dL   Triglycerides 116.0 0 - 149 mg/dL    Comment: Normal:  <150 mg/dLBorderline High:  150 - 199 mg/dL   HDL 61.00 >39.00 mg/dL   VLDL 23.2 0.0 - 40.0 mg/dL   LDL Cholesterol 69 0 - 99 mg/dL   Total CHOL/HDL Ratio 3     Comment:                Men          Women1/2 Average Risk     3.4          3.3Average Risk          5.0          4.42X Average Risk          9.6          7.13X Average Risk          15.0          11.0  NonHDL 92.45     Comment: NOTE:  Non-HDL goal should be 30 mg/dL higher than patient's LDL goal (i.e. LDL goal of < 70 mg/dL, would have non-HDL goal of < 100 mg/dL)  Basic metabolic panel     Status: Abnormal   Collection Time: 05/15/20  9:40 AM  Result Value Ref Range   Sodium 140 135 - 145 mEq/L   Potassium 3.8 3.5 - 5.1 mEq/L   Chloride 107 96 - 112 mEq/L   CO2 25 19 - 32 mEq/L    Glucose, Bld 105 (H) 70 - 99 mg/dL   BUN 20 6 - 23 mg/dL   Creatinine, Ser 0.96 0.40 - 1.20 mg/dL   GFR 60.84 >60.00 mL/min   Calcium 9.9 8.4 - 10.5 mg/dL  TSH     Status: None   Collection Time: 05/15/20  9:40 AM  Result Value Ref Range   TSH 2.49 0.35 - 4.50 uIU/mL  Hepatic function panel     Status: Abnormal   Collection Time: 05/15/20  9:40 AM  Result Value Ref Range   Total Bilirubin 1.4 (H) 0.2 - 1.2 mg/dL   Bilirubin, Direct 0.2 0.0 - 0.3 mg/dL   Alkaline Phosphatase 38 (L) 39 - 117 U/L   AST 19 0 - 37 U/L   ALT 15 0 - 35 U/L   Total Protein 6.6 6.0 - 8.3 g/dL   Albumin 4.3 3.5 - 5.2 g/dL  CBC with Differential/Platelet     Status: None   Collection Time: 05/15/20  9:40 AM  Result Value Ref Range   WBC 4.2 4.0 - 10.5 K/uL   RBC 4.66 3.87 - 5.11 Mil/uL   Hemoglobin 14.3 12.0 - 15.0 g/dL   HCT 42.7 36 - 46 %   MCV 91.6 78.0 - 100.0 fl   MCHC 33.5 30.0 - 36.0 g/dL   RDW 13.5 11.5 - 15.5 %   Platelets 223.0 150 - 400 K/uL   Neutrophils Relative % 54.2 43 - 77 %   Lymphocytes Relative 33.6 12 - 46 %   Monocytes Relative 8.9 3 - 12 %   Eosinophils Relative 2.7 0 - 5 %   Basophils Relative 0.6 0 - 3 %   Neutro Abs 2.3 1.4 - 7.7 K/uL   Lymphs Abs 1.4 0.7 - 4.0 K/uL   Monocytes Absolute 0.4 0.1 - 1.0 K/uL   Eosinophils Absolute 0.1 0.0 - 0.7 K/uL   Basophils Absolute 0.0 0.0 - 0.1 K/uL    Assessment/Plan: 1. Pain of right thigh Lateral nerve inflammation. Avoid tight-fitting clothing. No heavy lifting or overexertion. Will start Medrol pack to help calm down inflammation and pain. Tylenol for breakthrough pain. Start Robaxin nightly. Supportive measures discussed. If not improving, will need imaging and consideration for PT versus specialist referral.  - methylPREDNISolone (MEDROL DOSEPAK) 4 MG TBPK tablet; Take following package directions  Dispense: 21 tablet; Refill: 0 - methocarbamol (ROBAXIN) 500 MG tablet; Take 1 tablet (500 mg total) by mouth at bedtime.  Dispense:  15 tablet; Refill: 0  This visit occurred during the SARS-CoV-2 public health emergency.  Safety protocols were in place, including screening questions prior to the visit, additional usage of staff PPE, and extensive cleaning of exam room while observing appropriate contact time as indicated for disinfecting solutions.     Leeanne Rio, PA-C

## 2020-06-14 ENCOUNTER — Ambulatory Visit: Payer: Medicare Other | Attending: Internal Medicine

## 2020-06-14 ENCOUNTER — Telehealth: Payer: Self-pay

## 2020-06-14 ENCOUNTER — Other Ambulatory Visit (HOSPITAL_BASED_OUTPATIENT_CLINIC_OR_DEPARTMENT_OTHER): Payer: Self-pay | Admitting: Internal Medicine

## 2020-06-14 DIAGNOSIS — Z23 Encounter for immunization: Secondary | ICD-10-CM

## 2020-06-14 NOTE — Progress Notes (Unsigned)
    Chronic Care Management Pharmacy Assistant   Name: TEMIMA KUTSCH  MRN: [id number] DOB: August 10, 1951  Reason for Encounter: Initial Visit  Patient Questions:  1.  Have you seen any other providers since your last visit?   2.  Any changes in your medicines or health?    Alexandra [name],  69 y.o. , female presents for their Initial CCM visit with the clinical pharmacist via telephone.  PCP : [name], MD  Allergies:  No Known Allergies  Medications: Outpatient Encounter Medications as of 06/14/2020  Medication Sig  . acetaZOLAMIDE (DIAMOX) 500 MG capsule Take 500 mg by mouth 2 (two) times daily.  . Ascorbic Acid (VITAMIN C) 1000 MG tablet Take 1,000 mg by mouth daily.  Marland Kitchen aspirin EC 81 MG tablet Take 81 mg by mouth daily.  . B Complex-C (SUPER B COMPLEX PO) Take by mouth.  . brimonidine (ALPHAGAN) 0.2 % ophthalmic solution   . Calcium Carbonate-Vit D-Min (CALCIUM 1200 PO) Take by mouth.  . Cholecalciferol (VITAMIN D) 2000 units tablet Take 2,000 Units by mouth daily.  . dorzolamide-timolol (COSOPT) 22.3-6.8 MG/ML ophthalmic solution   . Glucosamine-Chondroit-Vit C-Mn (GLUCOSAMINE 1500 COMPLEX) CAPS Take by mouth.  . hydrochlorothiazide (HYDRODIURIL) 12.5 MG tablet TAKE 1 TABLET BY MOUTH  DAILY  . LUMIGAN 0.01 % SOLN   . methocarbamol (ROBAXIN) 500 MG tablet Take 1 tablet (500 mg total) by mouth at bedtime.  . methylPREDNISolone (MEDROL DOSEPAK) 4 MG TBPK tablet Take following package directions  . Multiple Vitamins-Minerals (MULTIVITAMIN ADULT PO) Take by mouth.  . RHOPRESSA 0.02 % SOLN   . simvastatin (ZOCOR) 20 MG tablet TAKE 1 TABLET BY MOUTH AT  BEDTIME  . tretinoin (RETIN-A) 0.05 % cream Apply topically at bedtime.  . vitamin E 400 UNIT capsule Take 400 Units by mouth daily.  Marland Kitchen zolpidem (AMBIEN CR) 6.25 MG CR tablet Take 1 tablet (6.25 mg total) by mouth at bedtime as needed for sleep.   No facility-administered encounter medications on file as of 06/14/2020.     Current Diagnosis: Patient Active Problem List   Diagnosis Date Noted  . Overweight (BMI 25.0-29.9) 04/04/2019  . Osteopenia 04/04/2019  . OSA (obstructive sleep apnea) 10/05/2017  . Family history of ovarian cancer 06/10/2015  . Physical exam 03/31/2011  . Hyperlipidemia 05/23/2008  . GLAUCOMA NOS 05/02/2007  . Essential hypertension 05/02/2007   Have you seen any other providers since your last visit?   Any changes in your medications or health?   Any side effects from any medications?   Do you have an symptoms or problems not managed by your medications?   Any concerns about your health right now?   Has your provider asked that you check blood pressure, blood sugar, or follow special diet at home?   Do you get any type of exercise on a regular basis?   Can you think of a goal you would like to reach for your health?   Do you have any problems getting your medications?   Is there anything that you would like to discuss during the appointment?   Please bring medications and supplements to appointment   Georgiana Shore ,[facility] Clinical Pharmacist Assistant [phone]  Follow-Up:  Pharmacist Review

## 2020-06-14 NOTE — Progress Notes (Signed)
   Covid-19 Vaccination Clinic  Name:  AQSA SENSABAUGH    MRN: [id number] DOB: Sep 16, 1950  06/14/2020  Ms. Habermann was observed post Covid-19 immunization for 15 minutes without incident. She was provided with Vaccine Information Sheet and instruction to access the V-Safe system.   Ms. Kisner was instructed to call 911 with any severe reactions post vaccine: Marland Kitchen Difficulty breathing  . Swelling of face and throat  . A fast heartbeat  . A bad rash all over body  . Dizziness and weakness

## 2020-06-17 ENCOUNTER — Telehealth: Payer: Medicare Other

## 2020-06-20 MED FILL — PFIZER-BIONTECH COVID-19 VA: 30 | 1 days supply | Qty: 0 | Fill #0

## 2020-06-28 ENCOUNTER — Ambulatory Visit: Payer: Medicare Other

## 2020-06-28 NOTE — Chronic Care Management (AMB) (Signed)
  Chronic Care Management   Outreach Note   Name: Alexandra [name] MRN: [id number] DOB: [birth year]  Referred by: [name], MD Reason for referral: Telephone Appointment with [facility] Pharmacist, [name].   An unsuccessful telephone outreach was attempted today. The patient was referred to the pharmacist for assistance with care management and care coordination.   Telephone appointment with clinical pharmacist today (06/28/2020) at 230pm. If patient immediately returns call, transfer to [phone]. Otherwise, please provide this number so patient can reschedule visit.   [name], Pharm.D., BCGP Clinical Pharmacist [facility] [phone]

## 2020-07-02 DIAGNOSIS — Z961 Presence of intraocular lens: Secondary | ICD-10-CM | POA: Diagnosis not present

## 2020-07-02 DIAGNOSIS — H401122 Primary open-angle glaucoma, left eye, moderate stage: Secondary | ICD-10-CM | POA: Diagnosis not present

## 2020-07-02 DIAGNOSIS — H53032 Strabismic amblyopia, left eye: Secondary | ICD-10-CM | POA: Diagnosis not present

## 2020-07-02 DIAGNOSIS — H43813 Vitreous degeneration, bilateral: Secondary | ICD-10-CM | POA: Diagnosis not present

## 2020-07-02 DIAGNOSIS — H401113 Primary open-angle glaucoma, right eye, severe stage: Secondary | ICD-10-CM | POA: Diagnosis not present

## 2020-07-12 ENCOUNTER — Encounter: Payer: Self-pay | Admitting: Internal Medicine

## 2020-07-12 ENCOUNTER — Telehealth (INDEPENDENT_AMBULATORY_CARE_PROVIDER_SITE_OTHER): Payer: Medicare Other | Admitting: Internal Medicine

## 2020-07-12 DIAGNOSIS — M79651 Pain in right thigh: Secondary | ICD-10-CM

## 2020-07-12 DIAGNOSIS — M5431 Sciatica, right side: Secondary | ICD-10-CM | POA: Diagnosis not present

## 2020-07-12 MED ORDER — METHOCARBAMOL 500 MG PO TABS: 500.0000 mg | ORAL_TABLET | Freq: Every day | ORAL | 0 refills | Status: DC

## 2020-07-12 MED ORDER — METHYLPREDNISOLONE 4 MG PO TBPK: ORAL_TABLET | ORAL | 0 refills | Status: DC

## 2020-07-12 NOTE — Progress Notes (Signed)
Virtual Visit via Video Note  I connected with@ on 07/12/20 at  3:00 PM EST by a video enabled telemedicine application and verified that I am speaking with the correct person using two identifiers. Location patient: home Location provider:work  office Persons participating in the virtual visit: patient, provider  [place] national recommendations  regarding COVID 19 pandemic   video visit is advised over in office visit for this patient.  Patient aware  of the limitations of evaluation and management by telemedicine and  availability of in person appointments. and agreed to proceed.   HPI: Alexandra [name] presents for video visit   technical problems    Again with  My chart  Problem with and technical difficulties unable to hear so turned into more of a telephone visit. Patient had similar symptoms about a month ago when she got up out of bed and suddenly shooting pain down her right leg thigh buttocks when standing.  Was seen by physicians assistants given Medrol Dosepak muscle relaxant at night and it soon resolved and thought she was doing much better until yesterday when she swung her leg out of bed and stood on it had immediate pain shooting pain down the back of her leg on the right. No weakness or numbness. No real back pain fever or systemic symptoms. No injury. Pain seems to be a little better after she gets up and around but is severe at times. Is unable to get an appointment with her PCP who is out of the office.   ROS: See pertinent positives and negatives per HPI.  Past Medical History:  Diagnosis Date  . Cataract    REMOVED BOTH - LENS IMPLANTS  . Depression   . Family history of ovarian cancer   . Glaucoma   . Hyperlipidemia   . Hypertension   . OSA (obstructive sleep apnea) 10/05/2017  . Osteopenia   . Sleep apnea    WEARS CPAP    Past Surgical History:  Procedure Laterality Date  . CATARACT EXTRACTION     11/04/17  . COLONOSCOPY    . DERMOID CYST  EXCISION    .  ELBOW FRACTURE SURGERY  2004    Family History  Problem Relation Age of Onset  . Heart attack Father   . Lung cancer Sister 3       smoker  . Throat cancer Brother 15       smoker  . Ovarian cancer Mother 62  . Esophageal cancer Maternal Uncle   . Lung cancer Maternal Grandfather        dx in his 49s  . Hodgkin's lymphoma Other 16  . Colon cancer Neg Hx   . Colon polyps Neg Hx   . Rectal cancer Neg Hx   . Stomach cancer Neg Hx     Social History   Tobacco Use  . Smoking status: Never Smoker  . Smokeless tobacco: Never Used  Vaping Use  . Vaping Use: Never used  Substance Use Topics  . Alcohol use: Yes    Alcohol/week: 2.0 standard drinks    Types: 2 Glasses of wine per week  . Drug use: No      Current Outpatient Medications:  .  acetaZOLAMIDE (DIAMOX) 500 MG capsule, Take 500 mg by mouth 2 (two) times daily., Disp: , Rfl:  .  Ascorbic Acid (VITAMIN C) 1000 MG tablet, Take 1,000 mg by mouth daily., Disp: , Rfl:  .  aspirin EC 81 MG tablet, Take 81 mg by  mouth daily., Disp: , Rfl:  .  B Complex-C (SUPER B COMPLEX PO), Take by mouth., Disp: , Rfl:  .  brimonidine (ALPHAGAN) 0.2 % ophthalmic solution, , Disp: , Rfl: 3 .  Calcium Carbonate-Vit D-Min (CALCIUM 1200 PO), Take by mouth., Disp: , Rfl:  .  Cholecalciferol (VITAMIN D) 2000 units tablet, Take 2,000 Units by mouth daily., Disp: , Rfl:  .  dorzolamide-timolol (COSOPT) 22.3-6.8 MG/ML ophthalmic solution, , Disp: , Rfl: 1 .  Glucosamine-Chondroit-Vit C-Mn (GLUCOSAMINE 1500 COMPLEX) CAPS, Take by mouth., Disp: , Rfl:  .  hydrochlorothiazide (HYDRODIURIL) 12.5 MG tablet, TAKE 1 TABLET BY MOUTH  DAILY, Disp: 90 tablet, Rfl: 3 .  LUMIGAN 0.01 % SOLN, , Disp: , Rfl:  .  methocarbamol (ROBAXIN) 500 MG tablet, Take 1 tablet (500 mg total) by mouth at bedtime. As needed, Disp: 15 tablet, Rfl: 0 .  Multiple Vitamins-Minerals (MULTIVITAMIN ADULT PO), Take by mouth., Disp: , Rfl:  .  RHOPRESSA 0.02 % SOLN, , Disp: , Rfl:  .   simvastatin (ZOCOR) 20 MG tablet, TAKE 1 TABLET BY MOUTH AT  BEDTIME, Disp: 90 tablet, Rfl: 1 .  tretinoin (RETIN-A) 0.05 % cream, Apply topically at bedtime., Disp: , Rfl:  .  vitamin E 400 UNIT capsule, Take 400 Units by mouth daily., Disp: , Rfl:  .  zolpidem (AMBIEN CR) 6.25 MG CR tablet, Take 1 tablet (6.25 mg total) by mouth at bedtime as needed for sleep., Disp: 30 tablet, Rfl: 1 .  methylPREDNISolone (MEDROL DOSEPAK) 4 MG TBPK tablet, Take following package directions, Disp: 21 tablet, Rfl: 0  EXAM: BP Readings from Last 3 Encounters:  06/13/20 112/70  05/15/20 110/60  11/03/19 (!) 106/53    VITALS per patient if applicable:  GENERAL: alert, oriented, appears well and in no acute distress unable to do visual but sounds in no acute distress cognitively intact with normal respirations.  CV: no obvious cyanosis  PSYCH/NEURO: pleasant and cooperative, no obvious depression or anxiety, speech and thought processing grossly intact Lab Results  Component Value Date   WBC 4.2 05/15/2020   HGB 14.3 05/15/2020   HCT 42.7 05/15/2020   PLT 223.0 05/15/2020   GLUCOSE 105 (H) 05/15/2020   CHOL 153 05/15/2020   TRIG 116.0 05/15/2020   HDL 61.00 05/15/2020   LDLDIRECT 108.0 08/11/2017   LDLCALC 69 05/15/2020   ALT 15 05/15/2020   AST 19 05/15/2020   NA 140 05/15/2020   K 3.8 05/15/2020   CL 107 05/15/2020   CREATININE 0.96 05/15/2020   BUN 20 05/15/2020   CO2 25 05/15/2020   TSH 2.49 05/15/2020   INR 0.87 09/15/2009   HGBA1C 5.7 12/16/2016    ASSESSMENT AND PLAN:  Discussed the following assessment and plan:    ICD-10-CM   1. Sciatica of right side  M54.31 Ambulatory referral to Sports Medicine   recurrent  2. Pain of right thigh  M79.651 methylPREDNISolone (MEDROL DOSEPAK) 4 MG TBPK tablet    methocarbamol (ROBAXIN) 500 MG tablet    Ambulatory referral to Sports Medicine   Recurrent symptom of what sounds like sciatica or similar.  Seems like neurologic pain or  radiating.  Doubt underlying systemic disease and no signs of infection at this time. Number of options none of them are guaranteed. We will refill her Medrol can try muscle relaxant at night can try meloxicam in addition that she has at home referral to sports medicine as there will be a delay in her follow-up of hands on  PCP appointment and she may benefit from imaging some physical modalities and assessment.  Does not sound like a surgical problem at this time.  We did discuss imaging as a help but not a definitive. Counseled.   Expectant management and discussion of plan and treatment with opportunity to ask questions and all were answered. The patient agreed with the plan and demonstrated an understanding of the instructions.   Advised to call back or seek an in-person evaluation if worsening  or having  further concerns . Return for if worse in meantime. 30 minutes  technical review and  Visit counseling   Shanon Ace, MD

## 2020-07-15 NOTE — Progress Notes (Signed)
Subjective:    CC: R sciatica  I, Alexandra [name], LAT, ATC, am serving as scribe for Dr. Lynne Leader.  HPI: Pt is a 69 y/o female presenting w/ c/o R posterior leg pain from her buttocks to her R lateral thigh/knee.  She's been dealing w/ intermittent pain beginning on 06/13/20 that flared up again last Thursday when she got out of bed. Pain radiates down the posterior lateral leg to the lateral right calf. She was treated initially with steroid Dosepak. This is helped quite a bit but wore off about 2 weeks after the steroid Dosepak. She was treated again recently and is currently on steroid Dosepak which she is to be helpful. She has not been able to exercise because of the pain. She denies any bowel or bladder dysfunction.  Low back pain: No R LE numbness/tingling: No R LE weakness: No Aggravating factors: first few steps of walking after prolonged sitting or laying;  Treatments tried: Methocarbamol; prednisone dose pack; heat; IBU   Pertinent review of Systems: No fevers or chills  Relevant historical information: Hypertension   Objective:    Vitals:   07/16/20 1045  BP: (!) 148/86  Pulse: 61  SpO2: 98%   General: Well Developed, well nourished, and in no acute distress.   MSK: L-spine normal-appearing Nontender to midline. Normal lumbar motion. Lower extremity strength reflexes and sensation are intact distally. Negative slump test right side.   Lab and Radiology Results  X-ray images L-spine obtained today personally and independently interpreted Facet DJD L4-5 and L5-S1. DDD L4-5 S1 present. No fracture or malalignment. Await formal radiology review   Impression and Recommendations:    Assessment and Plan: 69 y.o. female with lumbosacral radiculopathy right leg. Dermatomal pattern consistent with an L5. Patient has had symptoms ongoing since she first brought to medical attention November 4. She is had trials of conservative management and has had good  response to steroid Dosepaks. Plan to complete current Medrol Dosepak. We will stop Robaxin and start gabapentin. Also refer to physical therapy as this may be helpful. If not improving next step would be MRI lumbar spine. 6-week mark would be mid December sometime around the 20th. Patient will notify me if not improving or if worsening. Discussed return to exercise protocol. Check back as needed.Marland Kitchen  PDMP not reviewed this encounter. Orders Placed This Encounter  Procedures  . DG Lumbar Spine 2-3 Views    Standing Status:   Future    Number of Occurrences:   1    Standing Expiration Date:   08/16/2020    Order Specific Question:   Reason for Exam (SYMPTOM  OR DIAGNOSIS REQUIRED)    Answer:   Low back pain    Order Specific Question:   Preferred imaging location?    Answer:   [facility]  . Ambulatory referral to Physical Therapy    Referral Priority:   Routine    Referral Type:   Physical Medicine    Referral Reason:   Specialty Services Required    Requested Specialty:   Physical Therapy   Meds ordered this encounter  Medications  . gabapentin (NEURONTIN) 300 MG capsule    Sig: Take 1 capsule (300 mg total) by mouth 3 (three) times daily as needed.    Dispense:  90 capsule    Refill:  3    Discussed warning signs or symptoms. Please see discharge instructions. Patient expresses understanding.   The above documentation has been reviewed and is accurate  and complete Lynne Leader, M.D.

## 2020-07-16 ENCOUNTER — Encounter: Payer: Self-pay | Admitting: Family Medicine

## 2020-07-16 ENCOUNTER — Other Ambulatory Visit: Payer: Self-pay

## 2020-07-16 ENCOUNTER — Ambulatory Visit (INDEPENDENT_AMBULATORY_CARE_PROVIDER_SITE_OTHER): Payer: Medicare Other

## 2020-07-16 ENCOUNTER — Ambulatory Visit: Payer: Medicare Other | Admitting: Family Medicine

## 2020-07-16 VITALS — BP 148/86 | HR 61 | Ht 64.0 in | Wt 172.0 lb

## 2020-07-16 DIAGNOSIS — M5441 Lumbago with sciatica, right side: Secondary | ICD-10-CM

## 2020-07-16 DIAGNOSIS — M47812 Spondylosis without myelopathy or radiculopathy, cervical region: Secondary | ICD-10-CM | POA: Diagnosis not present

## 2020-07-16 MED ORDER — GABAPENTIN 300 MG PO CAPS: 300.0000 mg | ORAL_CAPSULE | Freq: Three times a day (TID) | ORAL | 3 refills | Status: DC | PRN

## 2020-07-16 NOTE — Patient Instructions (Signed)
Thank you for coming in today.  I've referred you to Physical Therapy.  Let us know if you don't hear from them in one week.  Please get an Xray today before you leave  If not better in 2 weeks let me know and I will order an MRI.   Use gabapentin sparingly mostly at bedtime for nerve pain.   If all well let me know.   Ok to advance exercise as tolerated. Take it easy at first.   If you need a refill of the steroid let me know.    Sciatica  Sciatica is pain, weakness, tingling, or loss of feeling (numbness) along the sciatic nerve. The sciatic nerve starts in the lower back and goes down the back of each leg. Sciatica usually goes away on its own or with treatment. Sometimes, sciatica may come back (recur). What are the causes? This condition happens when the sciatic nerve is pinched or has pressure put on it. This may be the result of:  A disk in between the bones of the spine bulging out too far (herniated disk).  Changes in the spinal disks that occur with aging.  A condition that affects a muscle in the butt.  Extra bone growth near the sciatic nerve.  A break (fracture) of the area between your hip bones (pelvis).  Pregnancy.  Tumor. This is rare. What increases the risk? You are more likely to develop this condition if you:  Play sports that put pressure or stress on the spine.  Have poor strength and ease of movement (flexibility).  Have had a back injury in the past.  Have had back surgery.  Sit for long periods of time.  Do activities that involve bending or lifting over and over again.  Are very overweight (obese). What are the signs or symptoms? Symptoms can vary from mild to very bad. They may include:  Any of these problems in the lower back, leg, hip, or butt: ? Mild tingling, loss of feeling, or dull aches. ? Burning sensations. ? Sharp pains.  Loss of feeling in the back of the calf or the sole of the foot.  Leg weakness.  Very bad back  pain that makes it hard to move. These symptoms may get worse when you cough, sneeze, or laugh. They may also get worse when you sit or stand for long periods of time. How is this treated? This condition often gets better without any treatment. However, treatment may include:  Changing or cutting back on physical activity when you have pain.  Doing exercises and stretching.  Putting ice or heat on the affected area.  Medicines that help: ? To relieve pain and swelling. ? To relax your muscles.  Shots (injections) of medicines that help to relieve pain, irritation, and swelling.  Surgery. Follow these instructions at home: Medicines  Take over-the-counter and prescription medicines only as told by your doctor.  Ask your doctor if the medicine prescribed to you: ? Requires you to avoid driving or using heavy machinery. ? Can cause trouble pooping (constipation). You may need to take these steps to prevent or treat trouble pooping:  Drink enough fluids to keep your pee (urine) pale yellow.  Take over-the-counter or prescription medicines.  Eat foods that are high in fiber. These include beans, whole grains, and fresh fruits and vegetables.  Limit foods that are high in fat and sugar. These include fried or sweet foods. Managing pain      If told, put ice  on the affected area. ? Put ice in a plastic bag. ? Place a towel between your skin and the bag. ? Leave the ice on for 20 minutes, 2-3 times a day.  If told, put heat on the affected area. Use the heat source that your doctor tells you to use, such as a moist heat pack or a heating pad. ? Place a towel between your skin and the heat source. ? Leave the heat on for 20-30 minutes. ? Remove the heat if your skin turns bright red. This is very important if you are unable to feel pain, heat, or cold. You may have a greater risk of getting burned. Activity   Return to your normal activities as told by your doctor. Ask your  doctor what activities are safe for you.  Avoid activities that make your symptoms worse.  Take short rests during the day. ? When you rest for a long time, do some physical activity or stretching between periods of rest. ? Avoid sitting for a long time without moving. Get up and move around at least one time each hour.  Exercise and stretch regularly, as told by your doctor.  Do not lift anything that is heavier than 10 lb (4.5 kg) while you have symptoms of sciatica. ? Avoid lifting heavy things even when you do not have symptoms. ? Avoid lifting heavy things over and over.  When you lift objects, always lift in a way that is safe for your body. To do this, you should: ? Bend your knees. ? Keep the object close to your body. ? Avoid twisting. General instructions  Stay at a healthy weight.  Wear comfortable shoes that support your feet. Avoid wearing high heels.  Avoid sleeping on a mattress that is too soft or too hard. You might have less pain if you sleep on a mattress that is firm enough to support your back.  Keep all follow-up visits as told by your doctor. This is important. Contact a doctor if:  You have pain that: ? Wakes you up when you are sleeping. ? Gets worse when you lie down. ? Is worse than the pain you have had in the past. ? Lasts longer than 4 weeks.  You lose weight without trying. Get help right away if:  You cannot control when you pee (urinate) or poop (have a bowel movement).  You have weakness in any of these areas and it gets worse: ? Lower back. ? The area between your hip bones. ? Butt. ? Legs.  You have redness or swelling of your back.  You have a burning feeling when you pee. Summary  Sciatica is pain, weakness, tingling, or loss of feeling (numbness) along the sciatic nerve.  This condition happens when the sciatic nerve is pinched or has pressure put on it.  Sciatica can cause pain, tingling, or loss of feeling (numbness) in  the lower back, legs, hips, and butt.  Treatment often includes rest, exercise, medicines, and putting ice or heat on the affected area. This information is not intended to replace advice given to you by your health care provider. Make sure you discuss any questions you have with your health care provider. Document Revised: 08/15/2018 Document Reviewed: 08/15/2018 Elsevier Patient Education  [place].

## 2020-07-17 ENCOUNTER — Encounter: Payer: Self-pay | Admitting: Family Medicine

## 2020-07-17 NOTE — Progress Notes (Signed)
X-ray lumbar spine shows arthritis in the low back.  If not improving MRI will show this better.

## 2020-07-29 ENCOUNTER — Telehealth: Payer: Self-pay | Admitting: Family Medicine

## 2020-07-29 NOTE — Telephone Encounter (Signed)
Patient called stating that she has been feeling much better and has not had any pain for several days. She is scheduled for PT this week but did not know if she should cancel that for now since she is feeling better or what Dr Georgina Snell would suggest?  Please advise.

## 2020-07-30 DIAGNOSIS — H401122 Primary open-angle glaucoma, left eye, moderate stage: Secondary | ICD-10-CM | POA: Diagnosis not present

## 2020-07-30 DIAGNOSIS — H53032 Strabismic amblyopia, left eye: Secondary | ICD-10-CM | POA: Diagnosis not present

## 2020-07-30 DIAGNOSIS — H43813 Vitreous degeneration, bilateral: Secondary | ICD-10-CM | POA: Diagnosis not present

## 2020-07-30 DIAGNOSIS — Z961 Presence of intraocular lens: Secondary | ICD-10-CM | POA: Diagnosis not present

## 2020-07-30 DIAGNOSIS — H401113 Primary open-angle glaucoma, right eye, severe stage: Secondary | ICD-10-CM | POA: Diagnosis not present

## 2020-07-30 NOTE — Telephone Encounter (Signed)
Ok to cancel

## 2020-07-30 NOTE — Telephone Encounter (Signed)
Called pt and relayed Dr. Clovis Riley advice regarding it being OK to cancel her PT appt.  Pt will let us know if her symptoms return but currently she is feeling much better.

## 2020-08-01 ENCOUNTER — Ambulatory Visit: Payer: Medicare Other | Admitting: Physical Therapy

## 2020-08-08 ENCOUNTER — Telehealth: Payer: Self-pay

## 2020-08-08 NOTE — Progress Notes (Unsigned)
° ° °  Chronic Care Management Pharmacy Assistant   Name: Alexandra [name]  MRN: [id number] DOB: 1951/01/25  Reason for Encounter: Disease State  PCP : Sheliah Hatch, MD  Allergies:  No Known Allergies  Medications: Outpatient Encounter Medications as of 08/08/2020  Medication Sig   acetaZOLAMIDE (DIAMOX) 500 MG capsule Take 500 mg by mouth 2 (two) times daily.   Ascorbic Acid (VITAMIN C) 1000 MG tablet Take 1,000 mg by mouth daily.   B Complex-C (SUPER B COMPLEX PO) Take by mouth.   brimonidine (ALPHAGAN) 0.2 % ophthalmic solution    Calcium Carbonate-Vit D-Min (CALCIUM 1200 PO) Take by mouth.   Cholecalciferol (VITAMIN D) 2000 units tablet Take 2,000 Units by mouth daily.   dorzolamide-timolol (COSOPT) 22.3-6.8 MG/ML ophthalmic solution    gabapentin (NEURONTIN) 300 MG capsule Take 1 capsule (300 mg total) by mouth 3 (three) times daily as needed.   Glucosamine-Chondroit-Vit C-Mn (GLUCOSAMINE 1500 COMPLEX) CAPS Take by mouth.   hydrochlorothiazide (HYDRODIURIL) 12.5 MG tablet TAKE 1 TABLET BY MOUTH  DAILY   LUMIGAN 0.01 % SOLN    methylPREDNISolone (MEDROL DOSEPAK) 4 MG TBPK tablet Take following package directions   Multiple Vitamins-Minerals (MULTIVITAMIN ADULT PO) Take by mouth.   RHOPRESSA 0.02 % SOLN    simvastatin (ZOCOR) 20 MG tablet TAKE 1 TABLET BY MOUTH AT  BEDTIME   tretinoin (RETIN-A) 0.05 % cream Apply topically at bedtime.   vitamin E 400 UNIT capsule Take 400 Units by mouth daily.   zolpidem (AMBIEN CR) 6.25 MG CR tablet Take 1 tablet (6.25 mg total) by mouth at bedtime as needed for sleep.   No facility-administered encounter medications on file as of 08/08/2020.    Current Diagnosis: Patient Active Problem List   Diagnosis Date Noted   Overweight (BMI 25.0-29.9) 04/04/2019   Osteopenia 04/04/2019   OSA (obstructive sleep apnea) 10/05/2017   Family history of ovarian cancer 06/10/2015   Physical exam 03/31/2011   Hyperlipidemia  05/23/2008   GLAUCOMA NOS 05/02/2007   Essential hypertension 05/02/2007   Have you had any problems recently with your health?  Have you had any problems with your pharmacy?  What issues or side effects are you having with your medications?  What would you like me to pass along to Gerilyn Pilgrim Potts,CPP for them to help you with?   What can we do to take care of you better?   Aloha Gell ,CMA Clinical Pharmacist Assistant [phone]  Follow-Up:  Pharmacist Review

## 2020-08-30 DIAGNOSIS — L570 Actinic keratosis: Secondary | ICD-10-CM | POA: Diagnosis not present

## 2020-08-30 DIAGNOSIS — Z85828 Personal history of other malignant neoplasm of skin: Secondary | ICD-10-CM | POA: Diagnosis not present

## 2020-09-05 ENCOUNTER — Telehealth: Payer: Self-pay

## 2020-09-05 NOTE — Chronic Care Management (AMB) (Signed)
Chronic Care Management Pharmacy Assistant   Name: Alexandra [name]  MRN: [id number] DOB: 1951-08-07  Reason for Encounter: Disease State/ Hypertension Adherence Call  PCP : [name], MD  Allergies:  No Known Allergies  Medications: Outpatient Encounter Medications as of 09/05/2020  Medication Sig  . acetaZOLAMIDE (DIAMOX) 500 MG capsule Take 500 mg by mouth 2 (two) times daily.  . Ascorbic Acid (VITAMIN C) 1000 MG tablet Take 1,000 mg by mouth daily.  . B Complex-C (SUPER B COMPLEX PO) Take by mouth.  . brimonidine (ALPHAGAN) 0.2 % ophthalmic solution   . Calcium Carbonate-Vit D-Min (CALCIUM 1200 PO) Take by mouth.  . Cholecalciferol (VITAMIN D) 2000 units tablet Take 2,000 Units by mouth daily.  . dorzolamide-timolol (COSOPT) 22.3-6.8 MG/ML ophthalmic solution   . gabapentin (NEURONTIN) 300 MG capsule Take 1 capsule (300 mg total) by mouth 3 (three) times daily as needed.  . Glucosamine-Chondroit-Vit C-Mn (GLUCOSAMINE 1500 COMPLEX) CAPS Take by mouth.  . hydrochlorothiazide (HYDRODIURIL) 12.5 MG tablet TAKE 1 TABLET BY MOUTH  DAILY  . LUMIGAN 0.01 % SOLN   . methylPREDNISolone (MEDROL DOSEPAK) 4 MG TBPK tablet Take following package directions  . Multiple Vitamins-Minerals (MULTIVITAMIN ADULT PO) Take by mouth.  . RHOPRESSA 0.02 % SOLN   . simvastatin (ZOCOR) 20 MG tablet TAKE 1 TABLET BY MOUTH AT  BEDTIME  . tretinoin (RETIN-A) 0.05 % cream Apply topically at bedtime.  . vitamin E 400 UNIT capsule Take 400 Units by mouth daily.  Marland Kitchen zolpidem (AMBIEN CR) 6.25 MG CR tablet Take 1 tablet (6.25 mg total) by mouth at bedtime as needed for sleep.   No facility-administered encounter medications on file as of 09/05/2020.    Current Diagnosis: Patient Active Problem List   Diagnosis Date Noted  . Overweight (BMI 25.0-29.9) 04/04/2019  . Osteopenia 04/04/2019  . OSA (obstructive sleep apnea) 10/05/2017  . Family history of ovarian cancer 06/10/2015  . Physical exam  03/31/2011  . Hyperlipidemia 05/23/2008  . GLAUCOMA NOS 05/02/2007  . Essential hypertension 05/02/2007    Reviewed chart prior to disease state call. Spoke with patient regarding BP  Recent Office Vitals: BP Readings from Last 3 Encounters:  07/16/20 (!) 148/86  06/13/20 112/70  05/15/20 110/60   Pulse Readings from Last 3 Encounters:  07/16/20 61  06/13/20 (!) 55  05/15/20 79    Wt Readings from Last 3 Encounters:  07/16/20 172 lb (78 kg)  06/13/20 172 lb (78 kg)  05/15/20 170 lb (77.1 kg)     Kidney Function Lab Results  Component Value Date/Time   CREATININE 0.96 05/15/2020 09:40 AM   CREATININE 0.83 11/03/2019 01:34 PM   CREATININE 0.88 05/08/2011 02:18 PM   GFR 60.84 05/15/2020 09:40 AM   GFRNONAA 92.98 03/26/2010 10:49 AM   GFRAA  09/15/2009 11:44 PM    >60        The eGFR has been calculated using the MDRD equation. This calculation has not been validated in all clinical situations. eGFR's persistently <60 mL/min signify possible Chronic Kidney Disease.    BMP Latest Ref Rng & Units 05/15/2020 11/03/2019 04/04/2019  Glucose 70 - 99 mg/dL 105(H) 90 100(H)  BUN 6 - 23 mg/dL $Remove'20 23 17  'IcTJsBL$ Creatinine 0.40 - 1.20 mg/dL 0.96 0.83 0.76  Sodium 135 - 145 mEq/L 140 140 141  Potassium 3.5 - 5.1 mEq/L 3.8 3.9 4.0  Chloride 96 - 112 mEq/L 107 103 105  CO2 19 - 32 mEq/L 25 30 27  Calcium 8.4 - 10.5 mg/dL 9.9 9.4 9.6    . Current antihypertensive regimen:  o Hydrochlorothiazide 12.5 mg daily  . How often are you checking your Blood Pressure? weekly   . Current home BP readings: 110/70  . What recent interventions/DTPs have been made by any provider to improve Blood Pressure control since last CPP Visit: Patient states she takes her medications as prescribed and works on diet and exercise. Patient states she has lost 18 lbs recently.  . Any recent hospitalizations or ED visits since last visit with CPP? No , patient has not had any recent hospitalizations or ED  visits since her last visit with [name], Pharm.D., BCGP.  Marland Kitchen What diet changes have been made to improve Blood Pressure Control?  o Patient states "I always diet, I have lost 18 pounds". Patient states she also avoids salt and foods high in sodium.  . What exercise is being done to improve your Blood Pressure Control?  o Patient states she exercises by doing a 2hr fitness class 7 days a week, sometimes twice a day.  Adherence Review: Is the patient currently on ACE/ARB medication? No Does the patient have >5 day gap between last estimated fill dates? No   April D Calhoun, [place] Pharmacist Assistant [phone]   Follow-Up:  Pharmacist Review

## 2020-09-11 DIAGNOSIS — H401113 Primary open-angle glaucoma, right eye, severe stage: Secondary | ICD-10-CM | POA: Diagnosis not present

## 2020-09-11 DIAGNOSIS — H43813 Vitreous degeneration, bilateral: Secondary | ICD-10-CM | POA: Diagnosis not present

## 2020-09-11 DIAGNOSIS — H401122 Primary open-angle glaucoma, left eye, moderate stage: Secondary | ICD-10-CM | POA: Diagnosis not present

## 2020-09-11 DIAGNOSIS — H53032 Strabismic amblyopia, left eye: Secondary | ICD-10-CM | POA: Diagnosis not present

## 2020-09-11 DIAGNOSIS — Z961 Presence of intraocular lens: Secondary | ICD-10-CM | POA: Diagnosis not present

## 2020-10-04 ENCOUNTER — Other Ambulatory Visit: Payer: Self-pay | Admitting: Family Medicine

## 2020-10-08 ENCOUNTER — Telehealth: Payer: Self-pay

## 2020-10-08 NOTE — Chronic Care Management (AMB) (Signed)
    Chronic Care Management Pharmacy Assistant   Name: Alexandra [name]  MRN: [id number] DOB: [birth year]  Reason for Encounter: Disease State/ General Adherence Call  PCP : [name], MD  Allergies:  No Known Allergies  Medications: Outpatient Encounter Medications as of 10/08/2020  Medication Sig  . acetaZOLAMIDE (DIAMOX) 500 MG capsule Take 500 mg by mouth 2 (two) times daily.  . Ascorbic Acid (VITAMIN C) 1000 MG tablet Take 1,000 mg by mouth daily.  . B Complex-C (SUPER B COMPLEX PO) Take by mouth.  . brimonidine (ALPHAGAN) 0.2 % ophthalmic solution   . Calcium Carbonate-Vit D-Min (CALCIUM 1200 PO) Take by mouth.  . Cholecalciferol (VITAMIN D) 2000 units tablet Take 2,000 Units by mouth daily.  . dorzolamide-timolol (COSOPT) 22.3-6.8 MG/ML ophthalmic solution   . gabapentin (NEURONTIN) 300 MG capsule Take 1 capsule (300 mg total) by mouth 3 (three) times daily as needed.  . Glucosamine-Chondroit-Vit C-Mn (GLUCOSAMINE 1500 COMPLEX) CAPS Take by mouth.  . hydrochlorothiazide (HYDRODIURIL) 12.5 MG tablet TAKE 1 TABLET BY MOUTH  DAILY  . LUMIGAN 0.01 % SOLN   . methylPREDNISolone (MEDROL DOSEPAK) 4 MG TBPK tablet Take following package directions  . Multiple Vitamins-Minerals (MULTIVITAMIN ADULT PO) Take by mouth.  . RHOPRESSA 0.02 % SOLN   . simvastatin (ZOCOR) 20 MG tablet TAKE 1 TABLET BY MOUTH AT  BEDTIME  . tretinoin (RETIN-A) 0.05 % cream Apply topically at bedtime.  . vitamin E 400 UNIT capsule Take 400 Units by mouth daily.  Marland Kitchen zolpidem (AMBIEN CR) 6.25 MG CR tablet Take 1 tablet (6.25 mg total) by mouth at bedtime as needed for sleep.   No facility-administered encounter medications on file as of 10/08/2020.    Current Diagnosis: Patient Active Problem List   Diagnosis Date Noted  . Overweight (BMI 25.0-29.9) 04/04/2019  . Osteopenia 04/04/2019  . OSA (obstructive sleep apnea) 10/05/2017  . Family history of ovarian cancer 06/10/2015  . Physical exam 03/31/2011  .  Hyperlipidemia 05/23/2008  . GLAUCOMA NOS 05/02/2007  . Essential hypertension 05/02/2007   Reviewed chart for any medication changes. No OVs, Consults, or hospital visits since last care coordination call/Pharmacist visit. No medication changes indicated.  Have you had any problems recently with your health?  Patient states she has not had any problems recently with her health.  Have you had any problems with your pharmacy?  Patient states she has not had any problems recently with her pharmacy.  What issues or side effects are you having with your medications?  Patient states she is not having any issues or side effects from any of her medications at this time.  What would you like me to pass along to [name], CPP for him to help you with?   Patient states she does not have anything to pass along at this time.  What can we do to take care of you better?  Patient states she is doing great at this time.  Future Appointments  Date Time Provider [place]  11/13/2020  8:30 AM [name], MD [facility]    Patient scheduled a follow up telephone appointment with [name], CPP on Thursday 01/16/2021 at 2:30 pm.  April D Calhoun, [place] Pharmacist Assistant [phone]     Follow-Up:  Pharmacist Review

## 2020-10-15 DIAGNOSIS — G4733 Obstructive sleep apnea (adult) (pediatric): Secondary | ICD-10-CM | POA: Diagnosis not present

## 2020-10-16 DIAGNOSIS — H53032 Strabismic amblyopia, left eye: Secondary | ICD-10-CM | POA: Diagnosis not present

## 2020-10-16 DIAGNOSIS — H43813 Vitreous degeneration, bilateral: Secondary | ICD-10-CM | POA: Diagnosis not present

## 2020-10-16 DIAGNOSIS — H401122 Primary open-angle glaucoma, left eye, moderate stage: Secondary | ICD-10-CM | POA: Diagnosis not present

## 2020-10-16 DIAGNOSIS — H401113 Primary open-angle glaucoma, right eye, severe stage: Secondary | ICD-10-CM | POA: Diagnosis not present

## 2020-10-16 DIAGNOSIS — Z961 Presence of intraocular lens: Secondary | ICD-10-CM | POA: Diagnosis not present

## 2020-11-13 ENCOUNTER — Encounter: Payer: Self-pay | Admitting: Family Medicine

## 2020-11-13 ENCOUNTER — Other Ambulatory Visit: Payer: Self-pay

## 2020-11-13 ENCOUNTER — Ambulatory Visit (INDEPENDENT_AMBULATORY_CARE_PROVIDER_SITE_OTHER): Payer: Medicare Other | Admitting: Family Medicine

## 2020-11-13 VITALS — BP 110/78 | HR 56 | Temp 97.5°F | Resp 16 | Ht 64.0 in | Wt 172.0 lb

## 2020-11-13 DIAGNOSIS — I10 Essential (primary) hypertension: Secondary | ICD-10-CM | POA: Diagnosis not present

## 2020-11-13 DIAGNOSIS — E785 Hyperlipidemia, unspecified: Secondary | ICD-10-CM

## 2020-11-13 DIAGNOSIS — E663 Overweight: Secondary | ICD-10-CM | POA: Diagnosis not present

## 2020-11-13 LAB — LIPID PANEL
Cholesterol: 159 mg/dL (ref 0–200)
HDL: 71.8 mg/dL (ref >39.00)
LDL Cholesterol: 64 mg/dL (ref 0–99)
NonHDL: 86.72
Total CHOL/HDL Ratio: 2
Triglycerides: 116 mg/dL (ref 0.0–149.0)
VLDL: 23.2 mg/dL (ref 0.0–40.0)

## 2020-11-13 LAB — CBC WITH DIFFERENTIAL/PLATELET
Basophils Absolute: 0 10*3/uL (ref 0.0–0.1)
Basophils Relative: 1.2 % (ref 0.0–3.0)
Eosinophils Absolute: 0.1 10*3/uL (ref 0.0–0.7)
Eosinophils Relative: 3.3 % (ref 0.0–5.0)
HCT: 41.8 % (ref 36.0–46.0)
Hemoglobin: 13.9 g/dL (ref 12.0–15.0)
Lymphocytes Relative: 36.4 % (ref 12.0–46.0)
Lymphs Abs: 1.3 10*3/uL (ref 0.7–4.0)
MCHC: 33.3 g/dL (ref 30.0–36.0)
MCV: 89.4 fl (ref 78.0–100.0)
Monocytes Absolute: 0.3 10*3/uL (ref 0.1–1.0)
Monocytes Relative: 9.6 % (ref 3.0–12.0)
Neutro Abs: 1.7 10*3/uL (ref 1.4–7.7)
Neutrophils Relative %: 49.5 % (ref 43.0–77.0)
Platelets: 245 10*3/uL (ref 150.0–400.0)
RBC: 4.67 Mil/uL (ref 3.87–5.11)
RDW: 12.8 % (ref 11.5–15.5)
WBC: 3.4 10*3/uL — ABNORMAL LOW (ref 4.0–10.5)

## 2020-11-13 LAB — BASIC METABOLIC PANEL
BUN: 20 mg/dL (ref 6–23)
CO2: 27 mEq/L (ref 19–32)
Calcium: 10 mg/dL (ref 8.4–10.5)
Chloride: 106 mEq/L (ref 96–112)
Creatinine, Ser: 0.96 mg/dL (ref 0.40–1.20)
GFR: 60.87 mL/min (ref >60.00)
Glucose, Bld: 89 mg/dL (ref 70–99)
Potassium: 3.4 mEq/L — ABNORMAL LOW (ref 3.5–5.1)
Sodium: 140 mEq/L (ref 135–145)

## 2020-11-13 LAB — HEPATIC FUNCTION PANEL
ALT: 13 U/L (ref 0–35)
AST: 20 U/L (ref 0–37)
Albumin: 4.2 g/dL (ref 3.5–5.2)
Alkaline Phosphatase: 42 U/L (ref 39–117)
Bilirubin, Direct: 0.2 mg/dL (ref 0.0–0.3)
Total Bilirubin: 0.9 mg/dL (ref 0.2–1.2)
Total Protein: 6.7 g/dL (ref 6.0–8.3)

## 2020-11-13 LAB — TSH: TSH: 2.36 u[IU]/mL (ref 0.35–4.50)

## 2020-11-13 NOTE — Patient Instructions (Signed)
Schedule your complete physical in 6 months We'll notify you of your lab results and make any changes if needed Keep up the good work on healthy diet and regular exercise- you look great!! Call with any questions or concerns Stay Safe!  Stay Healthy! ENJOY YOUR TRIP!!!

## 2020-11-13 NOTE — Progress Notes (Signed)
   Subjective:    Patient ID: Alexandra [name], female    DOB: 07/08/1951, 70 y.o.   MRN: [id number]  HPI HTN- chronic problem, on HCTZ 12.5mg  daily w/ good control.  No CP, SOB, HAs, visual changes, edema.  Hyperlipidemia- chronic problem, on Simvastatin 20mg  daily.  Last LDL 69.  No abd pain, N/V  Overweight- pt's BMI is 29.52  Exercising 90-120 minutes/day.  Does both cardio and strength training.   Review of Systems For ROS see HPI   This visit occurred during the SARS-CoV-2 public health emergency.  Safety protocols were in place, including screening questions prior to the visit, additional usage of staff PPE, and extensive cleaning of exam room while observing appropriate contact time as indicated for disinfecting solutions.       Objective:   Physical Exam Vitals reviewed.  Constitutional:      General: She is not in acute distress.    Appearance: Normal appearance. She is well-developed. She is not ill-appearing.  HENT:     Head: Normocephalic and atraumatic.  Eyes:     Conjunctiva/sclera: Conjunctivae normal.     Pupils: Pupils are equal, round, and reactive to light.  Neck:     Thyroid: No thyromegaly.  Cardiovascular:     Rate and Rhythm: Normal rate and regular rhythm.     Pulses: Normal pulses.     Heart sounds: Normal heart sounds. No murmur heard.   Pulmonary:     Effort: Pulmonary effort is normal. No respiratory distress.     Breath sounds: Normal breath sounds.  Abdominal:     General: There is no distension.     Palpations: Abdomen is soft.     Tenderness: There is no abdominal tenderness.  Musculoskeletal:     Cervical back: Normal range of motion and neck supple.     Right lower leg: No edema.     Left lower leg: No edema.  Lymphadenopathy:     Cervical: No cervical adenopathy.  Skin:    General: Skin is warm and dry.  Neurological:     Mental Status: She is alert and oriented to person, place, and time.  Psychiatric:        Behavior: Behavior  normal.           Assessment & Plan:

## 2020-11-13 NOTE — Assessment & Plan Note (Signed)
Chronic problem.  Tolerating Simvastatin w/o difficulty.  Interested in possibly stopping statin if labs still look good.  I'm ok w/ holding meds if labs are WNL and giving it a 6 month trial.  Pt expressed understanding and is in agreement w/ plan.

## 2020-11-13 NOTE — Assessment & Plan Note (Signed)
BMI is 29.52  Pt is incredibly active and exercises religiously.  Applauded her efforts.  Will continue to follow.

## 2020-11-13 NOTE — Assessment & Plan Note (Signed)
Chronic problem.  Excellent control on HCTZ.  Asymptomatic.  Check labs.  No anticipated med changes.

## 2020-11-15 ENCOUNTER — Other Ambulatory Visit: Payer: Self-pay | Admitting: Family Medicine

## 2020-11-15 DIAGNOSIS — Z1231 Encounter for screening mammogram for malignant neoplasm of breast: Secondary | ICD-10-CM

## 2021-01-04 ENCOUNTER — Other Ambulatory Visit: Payer: Self-pay | Admitting: Family Medicine

## 2021-01-16 ENCOUNTER — Telehealth: Payer: Medicare Other

## 2021-01-20 ENCOUNTER — Other Ambulatory Visit: Payer: Self-pay

## 2021-01-20 ENCOUNTER — Ambulatory Visit
Admission: RE | Admit: 2021-01-20 | Discharge: 2021-01-20 | Disposition: A | Discharge status: Home / Self Care | Payer: Medicare Other | Source: Ambulatory Visit

## 2021-01-20 DIAGNOSIS — Z1231 Encounter for screening mammogram for malignant neoplasm of breast: Secondary | ICD-10-CM

## 2021-01-22 ENCOUNTER — Telehealth: Payer: Medicare Other

## 2021-02-05 ENCOUNTER — Encounter: Payer: Self-pay | Admitting: *Deleted

## 2021-02-17 DIAGNOSIS — Z961 Presence of intraocular lens: Secondary | ICD-10-CM | POA: Diagnosis not present

## 2021-02-17 DIAGNOSIS — H43813 Vitreous degeneration, bilateral: Secondary | ICD-10-CM | POA: Diagnosis not present

## 2021-02-17 DIAGNOSIS — H53032 Strabismic amblyopia, left eye: Secondary | ICD-10-CM | POA: Diagnosis not present

## 2021-02-17 DIAGNOSIS — H401113 Primary open-angle glaucoma, right eye, severe stage: Secondary | ICD-10-CM | POA: Diagnosis not present

## 2021-02-17 DIAGNOSIS — H401122 Primary open-angle glaucoma, left eye, moderate stage: Secondary | ICD-10-CM | POA: Diagnosis not present

## 2021-03-20 ENCOUNTER — Telehealth: Payer: Self-pay

## 2021-03-20 NOTE — Progress Notes (Signed)
    Chronic Care Management Pharmacy Assistant   Name: Alexandra [name]  MRN: [id number] DOB: [birth year]   Reason for Encounter: Disease State - General Adherence / Schedule CPP Follow up Call    Recent office visits:  11/13/20 Annye Asa, MD (PCP) - Family Medicine - Hypertension - Labs were ordered. May hold Simvastatin if labs look good.  Follow up in 6 months.   Recent consult visits:  11/19/20 [name], NP - [facility] - COVID 19 testing -  Possible COVID exposure - COVID testing was negative. No follow up indicated.   10/16/20 Warden Fillers, MD - Opthalmology - Glaucoma - No notes available.   Hospital visits:  None in previous 6 months  Medications: Outpatient Encounter Medications as of 03/20/2021  Medication Sig   acetaZOLAMIDE (DIAMOX) 500 MG capsule Take 500 mg by mouth 2 (two) times daily.   Ascorbic Acid (VITAMIN C) 1000 MG tablet Take 1,000 mg by mouth daily.   B Complex-C (SUPER B COMPLEX PO) Take by mouth.   brimonidine (ALPHAGAN) 0.2 % ophthalmic solution    Calcium Carbonate-Vit D-Min (CALCIUM 1200 PO) Take by mouth.   COVID-19 mRNA vaccine, Pfizer, 30 MCG/0.3ML injection INJECT AS DIRECTED   dorzolamide-timolol (COSOPT) 22.3-6.8 MG/ML ophthalmic solution    Glucosamine-Chondroit-Vit C-Mn (GLUCOSAMINE 1500 COMPLEX) CAPS Take by mouth.   hydrochlorothiazide (HYDRODIURIL) 12.5 MG tablet TAKE 1 TABLET BY MOUTH  DAILY   LUMIGAN 0.01 % SOLN    Multiple Vitamins-Minerals (MULTIVITAMIN ADULT PO) Take by mouth.   RHOPRESSA 0.02 % SOLN    simvastatin (ZOCOR) 20 MG tablet TAKE 1 TABLET BY MOUTH AT  BEDTIME   tretinoin (RETIN-A) 0.05 % cream Apply topically at bedtime.   vitamin E 400 UNIT capsule Take 400 Units by mouth daily.   zolpidem (AMBIEN CR) 6.25 MG CR tablet Take 1 tablet (6.25 mg total) by mouth at bedtime as needed for sleep.   No facility-administered encounter medications on file as of 03/20/2021.    Have you had any problems recently with  your health? Patient denied any problems with her current health.  Have you had any problems with your pharmacy? Patient denied any problems with her current pharmacy.   What issues or side effects are you having with your medications? Patient denied any issues or side effects with her current medications.   What would you like me to pass along to [name], CPP for them to help you with?  Patient would like to discuss at her next CPP visit some concerns about her medication cost. She is able to get her medicines but feels that are a little expensive and would like to discuss possible options.   What can we do to take care of you better? Patient would like to just discuss her current medication costs and see if there is anything she can do to reduce some of her cost.   [facility] Simvastatin (ZOCOR) 20 MG tablet - last filled 01/07/21 90 days    Future Appointments  Date Time Provider [place]  04/16/2021  4:00 PM [facility] PHARMACIST [facility]  06/18/2021  1:30 PM [name], MD [facility]   Patient scheduled for Follow up CPP visit for : 04/16/21 @ 4:00 pm. Patient confirmed apopintment date and time.   Jobe Gibbon, Welcome Pharmacist Assistant  [phone]  Time Spent: 40 minutes

## 2021-04-03 ENCOUNTER — Encounter: Payer: Self-pay | Admitting: Family Medicine

## 2021-04-03 ENCOUNTER — Other Ambulatory Visit: Payer: Self-pay

## 2021-04-03 ENCOUNTER — Ambulatory Visit (INDEPENDENT_AMBULATORY_CARE_PROVIDER_SITE_OTHER): Payer: Medicare Other | Admitting: Family Medicine

## 2021-04-03 VITALS — BP 120/72 | HR 75 | Temp 98.2°F | Resp 17 | Wt 175.0 lb

## 2021-04-03 DIAGNOSIS — R42 Dizziness and giddiness: Secondary | ICD-10-CM

## 2021-04-03 DIAGNOSIS — R112 Nausea with vomiting, unspecified: Secondary | ICD-10-CM

## 2021-04-03 LAB — COMPREHENSIVE METABOLIC PANEL
ALT: 14 U/L (ref 0–35)
AST: 16 U/L (ref 0–37)
Albumin: 4.2 g/dL (ref 3.5–5.2)
Alkaline Phosphatase: 53 U/L (ref 39–117)
BUN: 16 mg/dL (ref 6–23)
CO2: 24 mEq/L (ref 19–32)
Calcium: 9.7 mg/dL (ref 8.4–10.5)
Chloride: 105 mEq/L (ref 96–112)
Creatinine, Ser: 0.95 mg/dL (ref 0.40–1.20)
GFR: 61.47 mL/min (ref >60.00)
Glucose, Bld: 94 mg/dL (ref 70–99)
Potassium: 3.6 mEq/L (ref 3.5–5.1)
Sodium: 139 mEq/L (ref 135–145)
Total Bilirubin: 1.1 mg/dL (ref 0.2–1.2)
Total Protein: 6.9 g/dL (ref 6.0–8.3)

## 2021-04-03 LAB — CBC
HCT: 42.7 % (ref 36.0–46.0)
Hemoglobin: 14.3 g/dL (ref 12.0–15.0)
MCHC: 33.6 g/dL (ref 30.0–36.0)
MCV: 88.5 fl (ref 78.0–100.0)
Platelets: 274 10*3/uL (ref 150.0–400.0)
RBC: 4.83 Mil/uL (ref 3.87–5.11)
RDW: 13.7 % (ref 11.5–15.5)
WBC: 4.8 10*3/uL (ref 4.0–10.5)

## 2021-04-03 MED ORDER — ONDANSETRON 4 MG PO TBDP: 4.0000 mg | ORAL_TABLET | Freq: Three times a day (TID) | ORAL | 0 refills | Status: DC | PRN

## 2021-04-03 MED ORDER — MECLIZINE HCL 25 MG PO TABS: 25.0000 mg | ORAL_TABLET | Freq: Three times a day (TID) | ORAL | 0 refills | Status: DC | PRN

## 2021-04-03 NOTE — Patient Instructions (Signed)
I suspect you have peripheral vertigo which may have been related to the congestion in the ears.  Meclizine 3 times per day as needed, Zofran if needed for nausea.  Make sure you discuss these medications with your ophthalmologist.  Small sips of fluids frequently to stay hydrated.  Continue Flonase for allergies, saline nasal spray for congestion.  I expect symptoms to continue to improve the next few days.  If any acute worsening or new symptoms be seen in the emergency room.  Vertigo Vertigo is the feeling that you or your surroundings are moving when they are not. This feeling can come and go at any time. Vertigo often goes away on its own. Vertigo can be dangerous if it occurs while you are doing something thatcould endanger yourself or others, such as driving or operating machinery. Your health care provider will do tests to try to determine the cause of your vertigo. Tests will also help your health care provider decide how best totreat your condition. Follow these instructions at home: Eating and drinking     Dehydration can make vertigo worse. Drink enough fluid to keep your urine pale yellow. Do not drink alcohol. Activity Return to your normal activities as told by your health care provider. Ask your health care provider what activities are safe for you. In the morning, first sit up on the side of the bed. When you feel okay, stand slowly while you hold onto something until you know that your balance is fine. Move slowly. Avoid sudden body or head movements or certain positions, as told by your health care provider. If you have trouble walking or keeping your balance, try using a cane for stability. If you feel dizzy or unstable, sit down right away. Avoid doing any tasks that would cause danger to you or others if vertigo occurs. Avoid bending down if you feel dizzy. Place items in your home so that they are easy for you to reach without bending or leaning over. Do not drive or use  machinery if you feel dizzy. General instructions Take over-the-counter and prescription medicines only as told by your health care provider. Keep all follow-up visits. This is important. Contact a health care provider if: Your medicines do not relieve your vertigo or they make it worse. Your condition gets worse or you develop new symptoms. You have a fever. You develop nausea or vomiting, or if nausea gets worse. Your family or friends notice any behavioral changes. You have numbness or a prickling and tingling sensation in part of your body. Get help right away if you: Are always dizzy or you faint. Develop severe headaches. Develop a stiff neck. Develop sensitivity to light. Have difficulty moving or speaking. Have weakness in your hands, arms, or legs. Have changes in your hearing or vision. These symptoms may represent a serious problem that is an emergency. Do not wait to see if the symptoms will go away. Get medical help right away. Call your local emergency services (911 in the [place] Do not drive yourself to the hospital. Summary Vertigo is the feeling that you or your surroundings are moving when they are not. Your health care provider will do tests to try to determine the cause of your vertigo. Follow instructions for home care. You may be told to avoid certain tasks, positions, or movements. Contact a health care provider if your medicines do not relieve your symptoms, or if you have a fever, nausea, vomiting, or changes in behavior. Get help right away if  you have severe headaches or difficulty speaking, or you develop hearing or vision problems. This information is not intended to replace advice given to you by your health care provider. Make sure you discuss any questions you have with your healthcare provider. Document Revised: 06/26/2020 Document Reviewed: 06/26/2020 Elsevier Patient Education  2022 [employer].

## 2021-04-03 NOTE — Progress Notes (Signed)
Subjective:  Patient ID: Alexandra [name], female    DOB: 1951/07/27  Age: 70 y.o. MRN: [id number]  CC:  Chief Complaint  Patient presents with   Dizziness    Nausea, vomiting, light headed. It was a little better this morning.     HPI [name] presents for   Dizziness History of hypertension, glaucoma, hyperlipidemia, OSA.  PCP, Dr. Birdie Riddle.  Initially dizziness, nausea, vomiting starting 2 nights ago. Sensation started when she stood up from watching tv.. no HA, focal weakness,or slurred speech. Pressure in ears. Itchy mouth, sneezing last week. Resolved with zyrtec. Uses flonase daily.  No chest pain, pressure, palpitations.  BP 152/80 2 nights ago. Now back to normal past 2 days, 120/70 range.  On diamox for glaucoma.  No recent med changes.  Some fatigue. Last vomiting yesterday am. Able to drink fluids. Still some nausea today.  No abdominal pain. Unsure if she has as appendix - dermoid cyst removed yrs ago.  Tx: alleve-D x 2.  1 glass wine night prior but no usual alcohol.  No hx vertigo  Better today overall - able to do exercise class today.   History Patient Active Problem List   Diagnosis Date Noted   Overweight (BMI 25.0-29.9) 04/04/2019   Osteopenia 04/04/2019   OSA (obstructive sleep apnea) 10/05/2017   Family history of ovarian cancer 06/10/2015   Physical exam 03/31/2011   Hyperlipidemia 05/23/2008   GLAUCOMA NOS 05/02/2007   Essential hypertension 05/02/2007   Past Medical History:  Diagnosis Date   Cataract    REMOVED BOTH - LENS IMPLANTS   Depression    Family history of ovarian cancer    Glaucoma    Hyperlipidemia    Hypertension    OSA (obstructive sleep apnea) 10/05/2017   Osteopenia    Sleep apnea    WEARS CPAP   Past Surgical History:  Procedure Laterality Date   CATARACT EXTRACTION     11/04/17   COLONOSCOPY     DERMOID CYST  EXCISION     ELBOW FRACTURE SURGERY  2004   No Known Allergies Prior to Admission medications    Medication Sig Start Date End Date Taking? Authorizing Provider  acetaZOLAMIDE (DIAMOX) 500 MG capsule Take 500 mg by mouth 2 (two) times daily. 04/09/20  Yes [provider]  Ascorbic Acid (VITAMIN C) 1000 MG tablet Take 1,000 mg by mouth daily.   Yes [provider]  B Complex-C (SUPER B COMPLEX PO) Take by mouth.   Yes [provider]  brimonidine (ALPHAGAN) 0.2 % ophthalmic solution  11/29/17  Yes [provider]  Calcium Carbonate-Vit D-Min (CALCIUM 1200 PO) Take by mouth.   Yes [provider]  dorzolamide-timolol (COSOPT) 22.3-6.8 MG/ML ophthalmic solution  11/01/17  Yes [provider]  Glucosamine-Chondroit-Vit C-Mn (GLUCOSAMINE 1500 COMPLEX) CAPS Take by mouth.   Yes [provider]  hydrochlorothiazide (HYDRODIURIL) 12.5 MG tablet TAKE 1 TABLET BY MOUTH  DAILY 10/04/20  Yes [name], MD  LUMIGAN 0.01 % SOLN  10/13/18  Yes [provider]  Multiple Vitamins-Minerals (MULTIVITAMIN ADULT PO) Take by mouth.   Yes [provider]  RHOPRESSA 0.02 % SOLN  03/01/19  Yes [provider]  simvastatin (ZOCOR) 20 MG tablet TAKE 1 TABLET BY MOUTH AT  BEDTIME 01/07/21  Yes [name], MD  tretinoin (RETIN-A) 0.05 % cream Apply topically at bedtime.   Yes [provider]  vitamin E 400 UNIT capsule Take 400 Units by  mouth daily.   Yes [provider]  zolpidem (AMBIEN CR) 6.25 MG CR tablet Take 1 tablet (6.25 mg total) by mouth at bedtime as needed for sleep. 12/20/19  Yes [name], MD  COVID-19 mRNA vaccine, Pfizer, 30 MCG/0.3ML injection INJECT AS DIRECTED Patient not taking: Reported on 04/03/2021 06/14/20 06/14/21  Carlyle Basques, MD   Social History   Socioeconomic History   Marital status: Married    Spouse name: Not on file   Number of children: 2   Years of education: Not on file   Highest education level: Not on file  Occupational History   Occupation:  retired  Tobacco Use   Smoking status: Never   Smokeless tobacco: Never  Vaping Use   Vaping Use: Never used  Substance and Sexual Activity   Alcohol use: Yes    Alcohol/week: 2.0 standard drinks    Types: 2 Glasses of wine per week   Drug use: No   Sexual activity: Not on file  Other Topics Concern   Not on file  Social History Narrative   Not on file   Social Determinants of Health   Financial Resource Strain: Low Risk    Difficulty of Paying Living Expenses: Not hard at all  Food Insecurity: Not on file  Transportation Needs: Not on file  Physical Activity: Sufficiently Active   Days of Exercise per Week: 7 days   Minutes of Exercise per Session: 90 min  Stress: No Stress Concern Present   Feeling of Stress : Not at all  Social Connections: Moderately Integrated   Frequency of Communication with Friends and Family: More than three times a week   Frequency of Social Gatherings with Friends and Family: Once a week   Attends Religious Services: More than 4 times per year   Active Member of [employer] or Organizations: No   Attends Archivist Meetings: Never   Marital Status: Married  Human resources officer Violence: Not At Risk   Fear of Current or Ex-Partner: No   Emotionally Abused: No   Physically Abused: No   Sexually Abused: No    Review of Systems Per HPI.   Objective:   Vitals:   04/03/21 1254  BP: 120/72  Pulse: 75  Resp: 17  Temp: 98.2 F (36.8 C)  TempSrc: Temporal  SpO2: 99%  Weight: 175 lb (79.4 kg)     Physical Exam Vitals reviewed.  Constitutional:      Appearance: Normal appearance. She is well-developed.  HENT:     Head: Normocephalic and atraumatic.     Ears:     Comments: Clear fluid seen at bases of right and left TM, no erythema, no bulging, no retraction.  Canals clear. Eyes:     Extraocular Movements:     Right eye: Nystagmus (Horizontal nystagmus, fast beat left bilaterally, noted with sitting to supine, more with supine to  sitting with some reproduction of symptoms.  No vertical nystagmus) present.     Left eye: Nystagmus present.     Conjunctiva/sclera: Conjunctivae normal.     Pupils: Pupils are equal, round, and reactive to light.  Neck:     Vascular: No carotid bruit.  Cardiovascular:     Rate and Rhythm: Normal rate and regular rhythm.     Heart sounds: Normal heart sounds.  Pulmonary:     Effort: Pulmonary effort is normal.     Breath sounds: Normal breath sounds.  Abdominal:     General: Abdomen is flat. There is  no distension.     Palpations: Abdomen is soft. There is no pulsatile mass.     Tenderness: There is no abdominal tenderness. There is no guarding.     Comments: Negative Murphy's  Musculoskeletal:     Right lower leg: No edema.     Left lower leg: No edema.  Skin:    General: Skin is warm and dry.  Neurological:     Mental Status: She is alert and oriented to person, place, and time.  Psychiatric:        Mood and Affect: Mood normal.        Behavior: Behavior normal.      Assessment & Plan:  MYKEA BONNE is a 70 y.o. female . Non-intractable vomiting with nausea, unspecified vomiting type - Plan: ondansetron (ZOFRAN ODT) 4 MG disintegrating tablet  Vertigo - Plan: meclizine (ANTIVERT) 25 MG tablet  Lightheadedness - Plan: Comprehensive metabolic panel, CBC  Exam overall reassuring, no focal weakness.  No cardiac symptoms.  Horizontal nystagmus with some reproduction of symptoms suggests most likely peripheral vertigo, potentially related to previous allergies and congestion.  -With her other medications we will check electrolytes with CMP, CBC to rule out anemia but unlikely  -Improving but will start meclizine, Zofran if needed for nausea, potential side effects discussed and advised she discuss with her ophthalmology office first.  -Saline nasal spray, continue Flonase, RTC precautions for ear/allergy symptoms.  -ER precautions given for dizziness.  Meds ordered this  encounter  Medications   ondansetron (ZOFRAN ODT) 4 MG disintegrating tablet    Sig: Take 1 tablet (4 mg total) by mouth every 8 (eight) hours as needed for nausea or vomiting.    Dispense:  10 tablet    Refill:  0   meclizine (ANTIVERT) 25 MG tablet    Sig: Take 1 tablet (25 mg total) by mouth 3 (three) times daily as needed for dizziness.    Dispense:  30 tablet    Refill:  0   Patient Instructions  I suspect you have peripheral vertigo which may have been related to the congestion in the ears.  Meclizine 3 times per day as needed, Zofran if needed for nausea.  Make sure you discuss these medications with your ophthalmologist.  Small sips of fluids frequently to stay hydrated.  Continue Flonase for allergies, saline nasal spray for congestion.  I expect symptoms to continue to improve the next few days.  If any acute worsening or new symptoms be seen in the emergency room.  Vertigo Vertigo is the feeling that you or your surroundings are moving when they are not. This feeling can come and go at any time. Vertigo often goes away on its own. Vertigo can be dangerous if it occurs while you are doing something thatcould endanger yourself or others, such as driving or operating machinery. Your health care provider will do tests to try to determine the cause of your vertigo. Tests will also help your health care provider decide how best totreat your condition. Follow these instructions at home: Eating and drinking     Dehydration can make vertigo worse. Drink enough fluid to keep your urine pale yellow. Do not drink alcohol. Activity Return to your normal activities as told by your health care provider. Ask your health care provider what activities are safe for you. In the morning, first sit up on the side of the bed. When you feel okay, stand slowly while you hold onto something until you know that your  balance is fine. Move slowly. Avoid sudden body or head movements or certain positions, as  told by your health care provider. If you have trouble walking or keeping your balance, try using a cane for stability. If you feel dizzy or unstable, sit down right away. Avoid doing any tasks that would cause danger to you or others if vertigo occurs. Avoid bending down if you feel dizzy. Place items in your home so that they are easy for you to reach without bending or leaning over. Do not drive or use machinery if you feel dizzy. General instructions Take over-the-counter and prescription medicines only as told by your health care provider. Keep all follow-up visits. This is important. Contact a health care provider if: Your medicines do not relieve your vertigo or they make it worse. Your condition gets worse or you develop new symptoms. You have a fever. You develop nausea or vomiting, or if nausea gets worse. Your family or friends notice any behavioral changes. You have numbness or a prickling and tingling sensation in part of your body. Get help right away if you: Are always dizzy or you faint. Develop severe headaches. Develop a stiff neck. Develop sensitivity to light. Have difficulty moving or speaking. Have weakness in your hands, arms, or legs. Have changes in your hearing or vision. These symptoms may represent a serious problem that is an emergency. Do not wait to see if the symptoms will go away. Get medical help right away. Call your local emergency services (911 in the [place] Do not drive yourself to the hospital. Summary Vertigo is the feeling that you or your surroundings are moving when they are not. Your health care provider will do tests to try to determine the cause of your vertigo. Follow instructions for home care. You may be told to avoid certain tasks, positions, or movements. Contact a health care provider if your medicines do not relieve your symptoms, or if you have a fever, nausea, vomiting, or changes in behavior. Get help right away if you have severe  headaches or difficulty speaking, or you develop hearing or vision problems. This information is not intended to replace advice given to you by your health care provider. Make sure you discuss any questions you have with your healthcare provider. Document Revised: 06/26/2020 Document Reviewed: 06/26/2020 Elsevier Patient Education  2022 [place],   Merri Ray, MD [place], [facility] 04/03/21 1:41 PM

## 2021-04-15 ENCOUNTER — Encounter: Payer: Self-pay | Admitting: Family Medicine

## 2021-04-16 ENCOUNTER — Ambulatory Visit (INDEPENDENT_AMBULATORY_CARE_PROVIDER_SITE_OTHER): Payer: Medicare Other

## 2021-04-16 DIAGNOSIS — I10 Essential (primary) hypertension: Secondary | ICD-10-CM

## 2021-04-16 DIAGNOSIS — E785 Hyperlipidemia, unspecified: Secondary | ICD-10-CM

## 2021-04-16 NOTE — Progress Notes (Signed)
Chronic Care Management Pharmacy Note  04/16/2021 Name:  Alexandra [name] MRN:  [id number] DOB:  February 18, 1951  Summary: Will trial off of hctz 12.5 mg due to only using 3-2G/MWNU, systolic bp 272Z+366Y and dizziness noted.  Will let us know if readings >130/80 consistently. Reviewed hydration and bp monitoring and will check in on pt next month. Due for flu shot   Subjective: Alexandra [name] is an 70 y.o. year old female who is a primary patient of Tabori, Aundra Millet, MD.  The CCM team was consulted for assistance with disease management and care coordination needs.    Engaged with patient by telephone for follow up visit in response to provider referral for pharmacy case management and/or care coordination services.   Consent to Services:  The patient was given the following information about Chronic Care Management services today, agreed to services, and gave verbal consent.  Patient Care Team: [name], MD as PCP - Glory Buff, [facility] as Pharmacist (Pharmacist)  Hospital visits: None in previous 6 months  Objective:  Lab Results  Component Value Date   CREATININE 0.95 04/03/2021   CREATININE 0.96 11/13/2020   CREATININE 0.96 05/15/2020    Lab Results  Component Value Date   HGBA1C 5.7 12/16/2016   Last diabetic Eye exam: No results found for: HMDIABEYEEXA  Last diabetic Foot exam: No results found for: HMDIABFOOTEX      Component Value Date/Time   CHOL 159 11/13/2020 0904   CHOL 153 05/15/2020 0940   CHOL 175 11/03/2019 1334   TRIG 116.0 11/13/2020 0904   TRIG 116.0 05/15/2020 0940   TRIG 138.0 11/03/2019 1334   HDL 71.80 11/13/2020 0904   HDL 61.00 05/15/2020 0940   HDL 72.40 11/03/2019 1334   CHOLHDL 2 11/13/2020 0904   VLDL 23.2 11/13/2020 0904   LDLCALC 64 11/13/2020 0904   LDLCALC 69 05/15/2020 0940   LDLCALC 75 11/03/2019 1334   LDLDIRECT 108.0 08/11/2017 1033   LDLDIRECT 107.0 12/15/2016 1014   LDLDIRECT 107.0 12/09/2015 0959    Hepatic  Function Latest Ref Rng & Units 04/03/2021 11/13/2020 05/15/2020  Total Protein 6.0 - 8.3 g/dL 6.9 6.7 6.6  Albumin 3.5 - 5.2 g/dL 4.2 4.2 4.3  AST 0 - 37 U/L $Remo'16 20 19  'rTcIQ$ ALT 0 - 35 U/L $Remo'14 13 15  'SUdSg$ Alk Phosphatase 39 - 117 U/L 53 42 38(L)  Total Bilirubin 0.2 - 1.2 mg/dL 1.1 0.9 1.4(H)  Bilirubin, Direct 0.0 - 0.3 mg/dL - 0.2 0.2    Lab Results  Component Value Date/Time   TSH 2.36 11/13/2020 09:04 AM   TSH 2.49 05/15/2020 09:40 AM    CBC Latest Ref Rng & Units 04/03/2021 11/13/2020 05/15/2020  WBC 4.0 - 10.5 K/uL 4.8 3.4(L) 4.2  Hemoglobin 12.0 - 15.0 g/dL 14.3 13.9 14.3  Hematocrit 36.0 - 46.0 % 42.7 41.8 42.7  Platelets 150.0 - 400.0 K/uL 274.0 245.0 223.0    Lab Results  Component Value Date/Time   VD25OH 38.95 04/04/2019 08:37 AM   VD25OH 36.96 06/17/2016 10:52 AM    Clinical ASCVD:  The 10-year ASCVD risk score Mikey Bussing DC Jr., et al., 2013) is: 7.7%   Values used to calculate the score:     Age: 71 years     Sex: Female     Is Non-Hispanic African American: No     Diabetic: No     Tobacco smoker: No     Systolic Blood Pressure: 403 mmHg     Is  BP treated: Yes     HDL Cholesterol: 71.8 mg/dL     Total Cholesterol: 159 mg/dL     Social History   Tobacco Use  Smoking Status Never  Smokeless Tobacco Never   BP Readings from Last 3 Encounters:  04/03/21 120/72  11/13/20 110/78  07/16/20 (!) 148/86   Pulse Readings from Last 3 Encounters:  04/03/21 75  11/13/20 (!) 56  07/16/20 61   Wt Readings from Last 3 Encounters:  04/03/21 175 lb (79.4 kg)  11/13/20 172 lb (78 kg)  07/16/20 172 lb (78 kg)   BMI Readings from Last 3 Encounters:  04/03/21 30.04 kg/m  11/13/20 29.52 kg/m  07/16/20 29.52 kg/m    Assessment: Review of patient past medical history, allergies, medications, health status, including review of consultants reports, laboratory and other test data, was performed as part of comprehensive evaluation and provision of chronic care management services.    SDOH:  (Social Determinants of Health) assessments and interventions performed:    CCM Care Plan  No Known Allergies  Medications Reviewed Today     Reviewed by [name], [facility] (Pharmacist) on 04/16/21 at 1635  Med List Status: <None>   Medication Order Taking? Sig Documenting Provider Last Dose Status Informant  acetaZOLAMIDE (DIAMOX) 500 MG capsule [id number] Yes Take 500 mg by mouth 2 (two) times daily. [provider] Taking Active   Ascorbic Acid (VITAMIN C) 1000 MG tablet [id number]  Take 1,000 mg by mouth daily. [provider]  Active   B Complex-C (SUPER B COMPLEX PO) [id number]  Take by mouth. [provider]  Active   brimonidine (ALPHAGAN) 0.2 % ophthalmic solution [id number]   [provider]  Active   Calcium Carbonate-Vit D-Min (CALCIUM 1200 PO) [id number]  Take by mouth. [provider]  Active   COVID-19 mRNA vaccine, Pfizer, 30 MCG/0.3ML injection [id number]  INJECT AS DIRECTED  Patient not taking: Reported on 04/03/2021   Carlyle Basques, MD  Active   dorzolamide-timolol (COSOPT) 22.3-6.8 MG/ML ophthalmic solution [id number]   [provider]  Active   Glucosamine-Chondroit-Vit C-Mn (GLUCOSAMINE 1500 COMPLEX) CAPS [id number]  Take by mouth. [provider]  Active   hydrochlorothiazide (HYDRODIURIL) 12.5 MG tablet [id number] Yes TAKE 1 TABLET BY MOUTH  DAILY [name], MD  Active   LUMIGAN 0.01 % SOLN [id number]   [provider]  Active   meclizine (ANTIVERT) 25 MG tablet [id number]  Take 1 tablet (25 mg total) by mouth 3 (three) times daily as needed for dizziness. Wendie Agreste, MD  Active   Multiple Vitamins-Minerals (MULTIVITAMIN ADULT PO) [id number]  Take by mouth. [provider]  Active   ondansetron (ZOFRAN ODT) 4 MG disintegrating tablet [id number]  Take 1 tablet (4 mg total) by mouth every 8 (eight) hours as needed for nausea or vomiting. Wendie Agreste, MD  Active    RHOPRESSA 0.02 % SOLN [id number]   [provider]  Active   simvastatin (ZOCOR) 20 MG tablet [id number] Yes TAKE 1 TABLET BY MOUTH AT  BEDTIME [name], MD Taking Active   tretinoin (RETIN-A) 0.05 % cream [id number]  Apply topically at bedtime. [provider]  Active   vitamin E 400 UNIT capsule [id number]  Take 400 Units by mouth daily. [provider]  Active   zolpidem (AMBIEN CR) 6.25 MG CR tablet [id number]  Take 1 tablet (6.25 mg total) by mouth at bedtime as needed for sleep. [name], MD  Active  Patient Active Problem List   Diagnosis Date Noted   Overweight (BMI 25.0-29.9) 04/04/2019   Osteopenia 04/04/2019   OSA (obstructive sleep apnea) 10/05/2017   Family history of ovarian cancer 06/10/2015   Physical exam 03/31/2011   Hyperlipidemia 05/23/2008   GLAUCOMA NOS 05/02/2007   Essential hypertension 05/02/2007    Immunization History  Administered Date(s) Administered   Fluad Quad(high Dose 65+) 05/15/2020   Influenza Whole 06/30/2007   Influenza,inj,Quad PF,6+ Mos 05/12/2013, 05/14/2014, 06/01/2014, 06/10/2015, 06/17/2016, 05/12/2017, 05/18/2018, 04/04/2019   PFIZER SARS-COV-2 Pediatric Vaccination 5-79yrs 06/14/2020   PFIZER(Purple Top)SARS-COV-2 Vaccination 08/30/2019, 09/20/2019, 06/14/2020, 12/19/2020   Pneumococcal Conjugate-13 08/11/2017   Pneumococcal Polysaccharide-23 04/04/2019   Td 09/11/2006   Tdap 12/15/2016   Zoster, Live 05/08/2011    Conditions to be addressed/monitored: HLD HTN Osteopenia   There are no care plans that you recently modified to display for this patient.  Pharmacist Clinical Goal(s):  Patient will verbalize ability to afford treatment regimen through collaboration with PharmD and provider.   Interventions: 1:1 collaboration with [name], MD regarding development and update of comprehensive plan of care as evidenced by provider attestation and  co-signature Inter-disciplinary care team collaboration (see longitudinal plan of care) Comprehensive medication review performed; medication list updated in electronic medical record  Hypertension (BP goal <130/80) -Controlled -Current treatment: Hydrochlorothiazide 12.5 mg once daily  -Medications previously tried: none  -Current home readings: 568S-168H systolic, some dizziness noted -Current dietary habits: low carb in general but sweets can be a challenge. Minimal processed foods.  -Current exercise habits: continues to do exercise class +/- walk every day.  -Reports hypotensive/hypertensive symptoms -Educated on Importance of home blood pressure monitoring; Proper BP monitoring technique; Symptoms of hypotension and importance of maintaining adequate hydration; -Counseled to monitor BP at home 3-4x/week, document, and provide log at future appointments -Recommended trial off of hydrochlorothiazide 12.5 mg due to only taking on occassion and home readings 729M/211D systolic and dizziness   Hyperlipidemia: (LDL goal < 100) -Controlled -Current treatment: Simvastatin 20 mg once daily  -Medications previously tried: n/a  -Reviewed side effects/adherence - no problems noted  -Educated on Cholesterol goals;  Benefits of statin for ASCVD risk reduction; -Recommended to continue current medication  Patient Goals/Self-Care Activities Patient will:  - target a minimum of 150 minutes of moderate intensity exercise weekly  Medication Assistance: None required.  Patient affirms current coverage meets needs.  Patient's preferred pharmacy is:  [place], [place] - [address] [place] [address] Phone: [phone] Fax: [phone]  [facility]  ([place], [place] [facility] [address] [place] [address] [place] [address] Phone: [phone] Fax: [phone]  Follow Up:   Patient agrees to Care Plan and Follow-up. Plan: HC 1 month BP check. CPP 6-8 month f/u call.   Future Appointments  Date Time Provider [place]  06/18/2021  1:30 PM [name], MD [facility], PharmD, CPP Clinical Pharmacist Practitioner  [place]  [phone]

## 2021-04-16 NOTE — Patient Instructions (Signed)
Alexandra [name],  Thank you for talking with me today. I have included our care plan/goals in the following pages.   Please review and call me at [phone] with any questions.  Thanks! Ellin Mayhew, PharmD, CPP Clinical Pharmacist Practitioner  [phone]  Conditions to be addressed/monitored: HLD HTN Osteopenia    There are no care plans that you recently modified to display for this patient.   Pharmacist Clinical Goal(s):  Patient will verbalize ability to afford treatment regimen through collaboration with PharmD and provider.    Interventions: 1:1 collaboration with [name], MD regarding development and update of comprehensive plan of care as evidenced by provider attestation and co-signature Inter-disciplinary care team collaboration (see longitudinal plan of care) Comprehensive medication review performed; medication list updated in electronic medical record   Hypertension (BP goal <130/80) -Controlled -Current treatment: Hydrochlorothiazide 12.5 mg once daily  -Medications previously tried: none  -Current home readings: 0000000 systolic, some dizziness noted -Current dietary habits: low carb in general but sweets can be a challenge. Minimal processed foods.  -Current exercise habits: continues to do exercise class +/- walk every day.  -Reports hypotensive/hypertensive symptoms -Educated on Importance of home blood pressure monitoring; Proper BP monitoring technique; Symptoms of hypotension and importance of maintaining adequate hydration; -Counseled to monitor BP at home 3-4x/week, document, and provide log at future appointments -Recommended trial off of hydrochlorothiazide 12.5 mg due to only taking on occassion and home readings [id number] systolic and dizziness    Hyperlipidemia: (LDL goal < 100) -Controlled -Current treatment: Simvastatin 20 mg once daily  -Medications previously tried: n/a  -Reviewed side effects/adherence - no problems  noted  -Educated on Cholesterol goals;  Benefits of statin for ASCVD risk reduction; -Recommended to continue current medication   Patient Goals/Self-Care Activities Patient will:  - target a minimum of 150 minutes of moderate intensity exercise weekly   Medication Assistance: None required.  Patient affirms current coverage meets needs.   Patient's preferred pharmacy is:   [place], [place] - [address] [place] [address] Phone: [phone] Fax: [phone]   [facility]  ([place], [place] [facility] [address] [place] [address] [place] [address] Phone: [phone] Fax: [phone]   Follow Up:  Patient agrees to Care Plan and Follow-up. Plan: HC 1 month BP check. CPP 6-8 month f/u call.   The patient verbalized understanding of instructions provided today and agreed to receive a MyChart copy of patient instruction and/or educational materials. Telephone follow up appointment with pharmacy team member scheduled for: See next appointment with "Care Management Staff" under "What's Next" below.   Hypertension, Adult High blood pressure (hypertension) is when the force of blood pumping through the arteries is too strong. The arteries are the blood vessels that carry blood from the heart throughout the body. Hypertension forces the heart to work harder to pump blood and may cause arteries to become narrow or stiff. Untreated or uncontrolled hypertension can cause a heart attack, heart failure, a stroke, kidney disease, and other problems. A blood pressure reading consists of a higher number over a lower number. Ideally, your blood pressure should be below 120/80. The first ("top") number is called the systolic pressure. It is a measure of the pressure in your arteries as your heart beats. The second ("bottom") number is called the diastolic pressure. It is a measure of the pressure  in  your arteries as the heart relaxes. What are the causes? The exact cause of this condition is not known. There are some conditions that result in or are related to high blood pressure. What increases the risk? Some risk factors for high blood pressure are under your control. The following factors may make you more likely to develop this condition: Smoking. Having type 2 diabetes mellitus, high cholesterol, or both. Not getting enough exercise or physical activity. Being overweight. Having too much fat, sugar, calories, or salt (sodium) in your diet. Drinking too much alcohol. Some risk factors for high blood pressure may be difficult or impossible to change. Some of these factors include: Having chronic kidney disease. Having a family history of high blood pressure. Age. Risk increases with age. Race. You may be at higher risk if you are African American. Gender. Men are at higher risk than women before age 63. After age 12, women are at higher risk than men. Having obstructive sleep apnea. Stress. What are the signs or symptoms? High blood pressure may not cause symptoms. Very high blood pressure (hypertensive crisis) may cause: Headache. Anxiety. Shortness of breath. Nosebleed. Nausea and vomiting. Vision changes. Severe chest pain. Seizures. How is this diagnosed? This condition is diagnosed by measuring your blood pressure while you are seated, with your arm resting on a flat surface, your legs uncrossed, and your feet flat on the floor. The cuff of the blood pressure monitor will be placed directly against the skin of your upper arm at the level of your heart. It should be measured at least twice using the same arm. Certain conditions can cause a difference in blood pressure between your right and left arms. Certain factors can cause blood pressure readings to be lower or higher than normal for a short period of time: When your blood pressure is higher when you are in a health  care provider's office than when you are at home, this is called white coat hypertension. Most people with this condition do not need medicines. When your blood pressure is higher at home than when you are in a health care provider's office, this is called masked hypertension. Most people with this condition may need medicines to control blood pressure. If you have a high blood pressure reading during one visit or you have normal blood pressure with other risk factors, you may be asked to: Return on a different day to have your blood pressure checked again. Monitor your blood pressure at home for 1 week or longer. If you are diagnosed with hypertension, you may have other blood or imaging tests to help your health care provider understand your overall risk for other conditions. How is this treated? This condition is treated by making healthy lifestyle changes, such as eating healthy foods, exercising more, and reducing your alcohol intake. Your health care provider may prescribe medicine if lifestyle changes are not enough to get your blood pressure under control, and if: Your systolic blood pressure is above 130. Your diastolic blood pressure is above 80. Your personal target blood pressure may vary depending on your medical conditions, your age, and other factors. Follow these instructions at home: Eating and drinking  Eat a diet that is high in fiber and potassium, and low in sodium, added sugar, and fat. An example eating plan is called the DASH (Dietary Approaches to Stop Hypertension) diet. To eat this way: Eat plenty of fresh fruits and vegetables. Try to fill one half of your plate at each meal with  fruits and vegetables. Eat whole grains, such as whole-wheat pasta, brown rice, or whole-grain bread. Fill about one fourth of your plate with whole grains. Eat or drink low-fat dairy products, such as skim milk or low-fat yogurt. Avoid fatty cuts of meat, processed or cured meats, and poultry  with skin. Fill about one fourth of your plate with lean proteins, such as fish, chicken without skin, beans, eggs, or tofu. Avoid pre-made and processed foods. These tend to be higher in sodium, added sugar, and fat. Reduce your daily sodium intake. Most people with hypertension should eat less than 1,500 mg of sodium a day. Do not drink alcohol if: Your health care provider tells you not to drink. You are pregnant, may be pregnant, or are planning to become pregnant. If you drink alcohol: Limit how much you use to: 0-1 drink a day for women. 0-2 drinks a day for men. Be aware of how much alcohol is in your drink. In the [place] one drink equals one 12 oz bottle of beer (355 mL), one 5 oz glass of wine (148 mL), or one 1 oz glass of hard liquor (44 mL). Lifestyle  Work with your health care provider to maintain a healthy body weight or to lose weight. Ask what an ideal weight is for you. Get at least 30 minutes of exercise most days of the week. Activities may include walking, swimming, or biking. Include exercise to strengthen your muscles (resistance exercise), such as Pilates or lifting weights, as part of your weekly exercise routine. Try to do these types of exercises for 30 minutes at least 3 days a week. Do not use any products that contain nicotine or tobacco, such as cigarettes, e-cigarettes, and chewing tobacco. If you need help quitting, ask your health care provider. Monitor your blood pressure at home as told by your health care provider. Keep all follow-up visits as told by your health care provider. This is important. Medicines Take over-the-counter and prescription medicines only as told by your health care provider. Follow directions carefully. Blood pressure medicines must be taken as prescribed. Do not skip doses of blood pressure medicine. Doing this puts you at risk for problems and can make the medicine less effective. Ask your health care provider about side effects or  reactions to medicines that you should watch for. Contact a health care provider if you: Think you are having a reaction to a medicine you are taking. Have headaches that keep coming back (recurring). Feel dizzy. Have swelling in your ankles. Have trouble with your vision. Get help right away if you: Develop a severe headache or confusion. Have unusual weakness or numbness. Feel faint. Have severe pain in your chest or abdomen. Vomit repeatedly. Have trouble breathing. Summary Hypertension is when the force of blood pumping through your arteries is too strong. If this condition is not controlled, it may put you at risk for serious complications. Your personal target blood pressure may vary depending on your medical conditions, your age, and other factors. For most people, a normal blood pressure is less than 120/80. Hypertension is treated with lifestyle changes, medicines, or a combination of both. Lifestyle changes include losing weight, eating a healthy, low-sodium diet, exercising more, and limiting alcohol. This information is not intended to replace advice given to you by your health care provider. Make sure you discuss any questions you have with your health care provider. Document Revised: 04/06/2018 Document Reviewed: 04/06/2018 Elsevier Patient Education  [place].

## 2021-05-09 ENCOUNTER — Encounter: Payer: Self-pay | Admitting: Adult Health

## 2021-05-09 ENCOUNTER — Other Ambulatory Visit: Payer: Self-pay

## 2021-05-09 ENCOUNTER — Ambulatory Visit: Payer: Medicare Other | Admitting: Adult Health

## 2021-05-09 DIAGNOSIS — E663 Overweight: Secondary | ICD-10-CM

## 2021-05-09 DIAGNOSIS — G4733 Obstructive sleep apnea (adult) (pediatric): Secondary | ICD-10-CM | POA: Diagnosis not present

## 2021-05-09 DIAGNOSIS — E785 Hyperlipidemia, unspecified: Secondary | ICD-10-CM | POA: Diagnosis not present

## 2021-05-09 DIAGNOSIS — I10 Essential (primary) hypertension: Secondary | ICD-10-CM

## 2021-05-09 NOTE — Progress Notes (Signed)
$'@Patient'n$  ID: Alexandra [name], female    DOB: 1951/06/17, 70 y.o.   MRN: [id number]  Chief Complaint  Patient presents with   Follow-up    Referring provider: [name], MD  HPI: 70 year old female followed for obstructive sleep apnea on nocturnal CPAP  TEST/EVENTS :  HST 10/02/17 >> AHI 8, SaO2 low 81%.  05/09/2021 Follow up : OSA  Patient returns for a 1 year follow-up for sleep apnea.  Patient has underlying sleep apnea is on nocturnal CPAP.  Patient says she is been having trouble with her CPAP machine over the last 2 weeks.  It keeps having loud noises . She has taken into her homecare company, told it was her motor. .  She has been notified that she will need a new CPAP machine.  Her machine is 3.69yrs old. She says she is still trying to wear her CPAP machine.  She wears it every night.  Likes to get in 7-8 hrs each night . Lately says its harder to wear due to loud noise and can not stand it -only able to wear for 4-6 hr .   CPAP download shows excellent compliance with 100% usage.  Daily average usage at 6 hours.  Patient is on auto CPAP 5 to 15 cm H2O.  AHI 0.4. +mask leak.  Uses nasal mask .   No Known Allergies  Immunization History  Administered Date(s) Administered   Fluad Quad(high Dose 65+) 05/15/2020   Influenza Whole 06/30/2007   Influenza,inj,Quad PF,6+ Mos 05/12/2013, 05/14/2014, 06/01/2014, 06/10/2015, 06/17/2016, 05/12/2017, 05/18/2018, 04/04/2019   PFIZER SARS-COV-2 Pediatric Vaccination 5-70yrs 06/14/2020   PFIZER(Purple Top)SARS-COV-2 Vaccination 08/30/2019, 09/20/2019, 06/14/2020, 12/19/2020   Pneumococcal Conjugate-13 08/11/2017   Pneumococcal Polysaccharide-23 04/04/2019   Td 09/11/2006   Tdap 12/15/2016   Zoster, Live 05/08/2011    Past Medical History:  Diagnosis Date   Cataract    REMOVED BOTH - LENS IMPLANTS   Depression    Family history of ovarian cancer    Glaucoma    Hyperlipidemia    Hypertension    OSA (obstructive sleep apnea)  10/05/2017   Osteopenia    Sleep apnea    WEARS CPAP    Tobacco History: Social History   Tobacco Use  Smoking Status Never  Smokeless Tobacco Never   Counseling given: Not Answered   Outpatient Medications Prior to Visit  Medication Sig Dispense Refill   acetaZOLAMIDE (DIAMOX) 500 MG capsule Take 500 mg by mouth 2 (two) times daily.     Ascorbic Acid (VITAMIN C) 1000 MG tablet Take 1,000 mg by mouth daily.     B Complex-C (SUPER B COMPLEX PO) Take by mouth.     brimonidine (ALPHAGAN) 0.2 % ophthalmic solution   3   Calcium Carbonate-Vit D-Min (CALCIUM 1200 PO) Take by mouth.     COVID-19 mRNA vaccine, Pfizer, 30 MCG/0.3ML injection INJECT AS DIRECTED .3 mL 0   dorzolamide-timolol (COSOPT) 22.3-6.8 MG/ML ophthalmic solution   1   Glucosamine-Chondroit-Vit C-Mn (GLUCOSAMINE 1500 COMPLEX) CAPS Take by mouth.     hydrochlorothiazide (HYDRODIURIL) 12.5 MG tablet TAKE 1 TABLET BY MOUTH  DAILY 90 tablet 3   LUMIGAN 0.01 % SOLN      Multiple Vitamins-Minerals (MULTIVITAMIN ADULT PO) Take by mouth.     RHOPRESSA 0.02 % SOLN      simvastatin (ZOCOR) 20 MG tablet TAKE 1 TABLET BY MOUTH AT  BEDTIME 90 tablet 3   tretinoin (RETIN-A) 0.05 % cream Apply topically at bedtime.  vitamin E 400 UNIT capsule Take 400 Units by mouth daily.     zolpidem (AMBIEN CR) 6.25 MG CR tablet Take 1 tablet (6.25 mg total) by mouth at bedtime as needed for sleep. 30 tablet 1   meclizine (ANTIVERT) 25 MG tablet Take 1 tablet (25 mg total) by mouth 3 (three) times daily as needed for dizziness. 30 tablet 0   ondansetron (ZOFRAN ODT) 4 MG disintegrating tablet Take 1 tablet (4 mg total) by mouth every 8 (eight) hours as needed for nausea or vomiting. 10 tablet 0   No facility-administered medications prior to visit.     Review of Systems:   Constitutional:   No  weight loss, night sweats,  Fevers, chills, fatigue, or  lassitude.  HEENT:   No headaches,  Difficulty swallowing,  Tooth/dental problems, or   Sore throat,                No sneezing, itching, ear ache, nasal congestion, post nasal drip,   CV:  No chest pain,  Orthopnea, PND, swelling in lower extremities, anasarca, dizziness, palpitations, syncope.   GI  No heartburn, indigestion, abdominal pain, nausea, vomiting, diarrhea, change in bowel habits, loss of appetite, bloody stools.   Resp: No shortness of breath with exertion or at rest.  No excess mucus, no productive cough,  No non-productive cough,  No coughing up of blood.  No change in color of mucus.  No wheezing.  No chest wall deformity  Skin: no rash or lesions.  GU: no dysuria, change in color of urine, no urgency or frequency.  No flank pain, no hematuria   MS:  No joint pain or swelling.  No decreased range of motion.  No back pain.    Physical Exam  BP 140/70 (BP Location: Left Arm, Patient Position: Sitting, Cuff Size: Normal)   Pulse 74   Temp 98.2 F (36.8 C) (Oral)   Ht $R'5\' 4"'Wk$  (1.626 m)   Wt 178 lb 3.2 oz (80.8 kg)   SpO2 98%   BMI 30.59 kg/m   GEN: A/Ox3; pleasant , NAD, well nourished    HEENT:  [place]/AT,  EACs-clear, TMs-wnl, NOSE-clear, THROAT-clear, no lesions, no postnasal drip or exudate noted.   NECK:  Supple w/ fair ROM; no JVD; normal carotid impulses w/o bruits; no thyromegaly or nodules palpated; no lymphadenopathy.    RESP  Clear  P & A; w/o, wheezes/ rales/ or rhonchi. no accessory muscle use, no dullness to percussion  CARD:  RRR, no m/r/g, no peripheral edema, pulses intact, no cyanosis or clubbing.  GI:   Soft & nt; nml bowel sounds; no organomegaly or masses detected.   Musco: Warm bil, no deformities or joint swelling noted.   Neuro: alert, no focal deficits noted.    Skin: Warm, no lesions or rashes    Lab Results:  CBC    Component Value Date/Time   WBC 4.8 04/03/2021 1342   RBC 4.83 04/03/2021 1342   HGB 14.3 04/03/2021 1342   HCT 42.7 04/03/2021 1342   PLT 274.0 04/03/2021 1342   MCV 88.5 04/03/2021 1342   MCHC  33.6 04/03/2021 1342   RDW 13.7 04/03/2021 1342   LYMPHSABS 1.3 11/13/2020 0904   MONOABS 0.3 11/13/2020 0904   EOSABS 0.1 11/13/2020 0904   BASOSABS 0.0 11/13/2020 0904    BMET    Component Value Date/Time   NA 139 04/03/2021 1342   K 3.6 04/03/2021 1342   CL 105 04/03/2021 1342   CO2  24 04/03/2021 1342   GLUCOSE 94 04/03/2021 1342   BUN 16 04/03/2021 1342   CREATININE 0.95 04/03/2021 1342   CREATININE 0.88 05/08/2011 1418   CALCIUM 9.7 04/03/2021 1342   GFRNONAA 92.98 03/26/2010 1049   GFRAA  09/15/2009 2344    >60        The eGFR has been calculated using the MDRD equation. This calculation has not been validated in all clinical situations. eGFR's persistently <60 mL/min signify possible Chronic Kidney Disease.    BNP No results found for: BNP  ProBNP No results found for: PROBNP  Imaging: No results found.    No flowsheet data found.  No results found for: NITRICOXIDE      Assessment & Plan:   OSA (obstructive sleep apnea) Excellent control and compliance on CPAP.  Patient wears her CPAP every single night.  Unfortunately her machine is not working properly.  Is had to decrease her number of hours because she is having a hard time tolerating the loud noises interrupting her sleep.  She needs a new CPAP machine.  Order has been placed.  Continue on CPAP AutoSet 5 to 15 cm H2O.  Plan  PLAN:  Patient Instructions  Order for new CPAP machine  Keep up the good work Wear your CPAP machine all night Work on healthy weight Do not drive if sleepy Follow-up with Dr. Halford Chessman in 1 year and As needed         Overweight (BMI 25.0-29.9) Healthy weight loss discussed    I spent   30 minutes dedicated to the care of this patient on the date of this encounter to include pre-visit review of records, face-to-face time with the patient discussing conditions above, post visit ordering of testing, clinical documentation with the electronic health record, making  appropriate referrals as documented, and communicating necessary findings to members of the patients care team.   Rexene Edison, NP 05/09/2021

## 2021-05-09 NOTE — Progress Notes (Signed)
Reviewed and agree with assessment/plan.   Chesley Mires, MD [facility] Pulmonary/Critical Care 05/09/2021, 6:15 PM Pager:  [phone]

## 2021-05-09 NOTE — Assessment & Plan Note (Signed)
Excellent control and compliance on CPAP.  Patient wears her CPAP every single night.  Unfortunately her machine is not working properly.  Is had to decrease her number of hours because she is having a hard time tolerating the loud noises interrupting her sleep.  She needs a new CPAP machine.  Order has been placed.  Continue on CPAP AutoSet 5 to 15 cm H2O.  Plan  PLAN:  Patient Instructions  Order for new CPAP machine  Keep up the good work Wear your CPAP machine all night Work on healthy weight Do not drive if sleepy Follow-up with Dr. Halford Chessman in 1 year and As needed

## 2021-05-09 NOTE — Patient Instructions (Addendum)
Order for new CPAP machine  Keep up the good work Leisure centre manager your CPAP machine all night Work on healthy weight Do not drive if sleepy Follow-up with Dr. Halford Chessman in 1 year and As needed

## 2021-05-09 NOTE — Assessment & Plan Note (Signed)
Healthy weight loss discussed 

## 2021-05-09 NOTE — Addendum Note (Signed)
Addended by: Elby Beck R on: 05/09/2021 04:22 PM   Modules accepted: Orders

## 2021-05-15 ENCOUNTER — Telehealth: Payer: Self-pay

## 2021-05-15 NOTE — Progress Notes (Signed)
Chronic Care Management Pharmacy Assistant   Name: Alexandra [name]  MRN: [id number] DOB: 1950-12-16   Reason for Encounter: Disease State - Hypertension Call / Scheduled Follow up with CPP   Recent office visits:  None noted.   Recent consult visits:  05/09/21 Carolynn Serve, NP - Pulmnology - Referral placed for DME (new CPAP) - Follow up in 1 year.   Hospital visits:  None in previous 6 months  Medications: Outpatient Encounter Medications as of 05/15/2021  Medication Sig   acetaZOLAMIDE (DIAMOX) 500 MG capsule Take 500 mg by mouth 2 (two) times daily.   Ascorbic Acid (VITAMIN C) 1000 MG tablet Take 1,000 mg by mouth daily.   B Complex-C (SUPER B COMPLEX PO) Take by mouth.   brimonidine (ALPHAGAN) 0.2 % ophthalmic solution    Calcium Carbonate-Vit D-Min (CALCIUM 1200 PO) Take by mouth.   COVID-19 mRNA vaccine, Pfizer, 30 MCG/0.3ML injection INJECT AS DIRECTED   dorzolamide-timolol (COSOPT) 22.3-6.8 MG/ML ophthalmic solution    Glucosamine-Chondroit-Vit C-Mn (GLUCOSAMINE 1500 COMPLEX) CAPS Take by mouth.   hydrochlorothiazide (HYDRODIURIL) 12.5 MG tablet TAKE 1 TABLET BY MOUTH  DAILY   LUMIGAN 0.01 % SOLN    Multiple Vitamins-Minerals (MULTIVITAMIN ADULT PO) Take by mouth.   RHOPRESSA 0.02 % SOLN    simvastatin (ZOCOR) 20 MG tablet TAKE 1 TABLET BY MOUTH AT  BEDTIME   tretinoin (RETIN-A) 0.05 % cream Apply topically at bedtime.   vitamin E 400 UNIT capsule Take 400 Units by mouth daily.   zolpidem (AMBIEN CR) 6.25 MG CR tablet Take 1 tablet (6.25 mg total) by mouth at bedtime as needed for sleep.   No facility-administered encounter medications on file as of 05/15/2021.    Current antihypertensive regimen:  Hydrochlorothiazide 12.5 mg once daily  (pt reported only taking about 3 days a week)  How often are you checking your Blood Pressure?  Patient reports she checks blood pressure once weekly.   Current home BP readings: 118/70    What recent interventions/DTPs have  been made by any provider to improve Blood Pressure control since last CPP Visit:  Patient has not had any changes in current regimen.   Any recent hospitalizations or ED visits since last visit with CPP?  Patient has not had any hospitalizations or ED visits since last visit with CPP.   What diet changes have been made to improve Blood Pressure Control?  Patient reported she has been only taking HCTZ three times a week and dizziness has improved.    What exercise is being done to improve your Blood Pressure Control?  Patient reported she does spin classes and yoga and cardio daily    Adherence Review: Is the patient currently on ACE/ARB medication? No Does the patient have >5 day gap between last estimated fill dates? No  Hydrochlorothiazide 12.5 mg once daily - last filled 02/01/21 90 days (patient reports she is only taking 3 times a week instead of daily)    Care Gaps  AWV: done 05/15/20 due 05/15/21 Colonoscopy: due 07/13/26 DM Eye Exam: N/A DM Foot Exam: N/A Microalbumin: N/A HbgAIC: N/A DEXA: due 06/12/21 Mammogram: due 01/21/23   Star Rating Drugs: Simvastatin (ZOCOR) 20 MG tablet - last filled 04/10/21 90 days (pt reported)  Future Appointments  Date Time Provider [place]  06/18/2021  1:30 PM [name], MD [facility]   Patient scheduled for Follow up CPP phone visit for : 11/19/20 @ 3:00pm. Patient confirmed appointment date and time.  Jobe Gibbon, [place] Pharmacist Assistant  [phone]  Time Spent: 45 minutes

## 2021-05-20 ENCOUNTER — Encounter: Payer: Medicare Other | Admitting: Family Medicine

## 2021-05-22 DIAGNOSIS — G4733 Obstructive sleep apnea (adult) (pediatric): Secondary | ICD-10-CM | POA: Diagnosis not present

## 2021-06-05 DIAGNOSIS — L814 Other melanin hyperpigmentation: Secondary | ICD-10-CM | POA: Diagnosis not present

## 2021-06-05 DIAGNOSIS — L578 Other skin changes due to chronic exposure to nonionizing radiation: Secondary | ICD-10-CM | POA: Diagnosis not present

## 2021-06-05 DIAGNOSIS — L570 Actinic keratosis: Secondary | ICD-10-CM | POA: Diagnosis not present

## 2021-06-05 DIAGNOSIS — D2262 Melanocytic nevi of left upper limb, including shoulder: Secondary | ICD-10-CM | POA: Diagnosis not present

## 2021-06-05 DIAGNOSIS — L821 Other seborrheic keratosis: Secondary | ICD-10-CM | POA: Diagnosis not present

## 2021-06-05 DIAGNOSIS — D225 Melanocytic nevi of trunk: Secondary | ICD-10-CM | POA: Diagnosis not present

## 2021-06-05 DIAGNOSIS — D0461 Carcinoma in situ of skin of right upper limb, including shoulder: Secondary | ICD-10-CM | POA: Diagnosis not present

## 2021-06-05 DIAGNOSIS — Z23 Encounter for immunization: Secondary | ICD-10-CM | POA: Diagnosis not present

## 2021-06-05 DIAGNOSIS — Z411 Encounter for cosmetic surgery: Secondary | ICD-10-CM | POA: Diagnosis not present

## 2021-06-05 DIAGNOSIS — D485 Neoplasm of uncertain behavior of skin: Secondary | ICD-10-CM | POA: Diagnosis not present

## 2021-06-05 DIAGNOSIS — L905 Scar conditions and fibrosis of skin: Secondary | ICD-10-CM | POA: Diagnosis not present

## 2021-06-11 ENCOUNTER — Telehealth: Payer: Self-pay

## 2021-06-11 NOTE — Progress Notes (Signed)
    Chronic Care Management Pharmacy Assistant   Name: Alexandra [name]  MRN: [id number] DOB: 18-Jan-1951   Reason for Encounter: Disease State - General Adherence Call     Recent office visits:  04/03/21 Alexandra Ray, NP - Family Medicine - Vomiting - labs were ordered. Meclizine (ANTIVERT) 25 MG tablet and Ondansetron (ZOFRAN ODT) 4 MG disintegrating tablet prescribed. Follow up as needed.   Recent consult visits:  05/15/21 Alexandra Edison, NP - Pulmonology - OSA - New equipment ordered. Follow up in 1 year.   Hospital visits:  None in previous 6 months  Medications: Outpatient Encounter Medications as of 06/11/2021  Medication Sig   acetaZOLAMIDE (DIAMOX) 500 MG capsule Take 500 mg by mouth 2 (two) times daily.   Ascorbic Acid (VITAMIN C) 1000 MG tablet Take 1,000 mg by mouth daily.   B Complex-C (SUPER B COMPLEX PO) Take by mouth.   brimonidine (ALPHAGAN) 0.2 % ophthalmic solution    Calcium Carbonate-Vit D-Min (CALCIUM 1200 PO) Take by mouth.   COVID-19 mRNA vaccine, Pfizer, 30 MCG/0.3ML injection INJECT AS DIRECTED   dorzolamide-timolol (COSOPT) 22.3-6.8 MG/ML ophthalmic solution    Glucosamine-Chondroit-Vit C-Mn (GLUCOSAMINE 1500 COMPLEX) CAPS Take by mouth.   hydrochlorothiazide (HYDRODIURIL) 12.5 MG tablet TAKE 1 TABLET BY MOUTH  DAILY   LUMIGAN 0.01 % SOLN    Multiple Vitamins-Minerals (MULTIVITAMIN ADULT PO) Take by mouth.   RHOPRESSA 0.02 % SOLN    simvastatin (ZOCOR) 20 MG tablet TAKE 1 TABLET BY MOUTH AT  BEDTIME   tretinoin (RETIN-A) 0.05 % cream Apply topically at bedtime.   vitamin E 400 UNIT capsule Take 400 Units by mouth daily.   zolpidem (AMBIEN CR) 6.25 MG CR tablet Take 1 tablet (6.25 mg total) by mouth at bedtime as needed for sleep.   No facility-administered encounter medications on file as of 06/11/2021.    Have you had any problems recently with your health? Patient denied having recent problems with her health.   Have you had any problems with your  pharmacy? Patient denied any problems with her current pharmacy.   What issues or side effects are you having with your medications? Patient denied any side effects or issues with her current medications.   What would you like me to pass along to [name], CPP for them to help you with?  Patient did not have anything to pass along to CPP at this time. She stated she has an appt with [name] in a week.   What can we do to take care of you better? Patient did not have any recommendations at this time.   Care Gaps  AWV: done 05/13/20 due 05/13/21 Colonoscopy: done 07/14/19 DM Eye Exam: done 04/03/20 DM Foot Exam: N/A Microalbumin: N/A HbgAIC: N/A DEXA: done 06/13/19 Mammogram: done 01/20/21  Star Rating Drugs: Simvastatin (ZOCOR) 20 MG tablet - patient using [facility] mail order.  Future Appointments  Date Time Provider [place]  06/18/2021  1:30 PM [name], MD [facility]  11/19/2021  3:00 PM [facility] PHARMACIST [facility] [place], [place] Pharmacist Assistant  [phone]  Time Spent: 29 minutes

## 2021-06-17 DIAGNOSIS — H401113 Primary open-angle glaucoma, right eye, severe stage: Secondary | ICD-10-CM | POA: Diagnosis not present

## 2021-06-17 DIAGNOSIS — Z961 Presence of intraocular lens: Secondary | ICD-10-CM | POA: Diagnosis not present

## 2021-06-17 DIAGNOSIS — H43813 Vitreous degeneration, bilateral: Secondary | ICD-10-CM | POA: Diagnosis not present

## 2021-06-17 DIAGNOSIS — H401122 Primary open-angle glaucoma, left eye, moderate stage: Secondary | ICD-10-CM | POA: Diagnosis not present

## 2021-06-17 DIAGNOSIS — H53032 Strabismic amblyopia, left eye: Secondary | ICD-10-CM | POA: Diagnosis not present

## 2021-06-18 ENCOUNTER — Encounter: Payer: Self-pay | Admitting: Family Medicine

## 2021-06-18 ENCOUNTER — Ambulatory Visit (INDEPENDENT_AMBULATORY_CARE_PROVIDER_SITE_OTHER): Payer: Medicare Other | Admitting: Family Medicine

## 2021-06-18 VITALS — BP 118/80 | HR 64 | Temp 97.8°F | Resp 17 | Ht 64.0 in | Wt 179.4 lb

## 2021-06-18 DIAGNOSIS — E785 Hyperlipidemia, unspecified: Secondary | ICD-10-CM

## 2021-06-18 DIAGNOSIS — Z23 Encounter for immunization: Secondary | ICD-10-CM | POA: Diagnosis not present

## 2021-06-18 DIAGNOSIS — M8580 Other specified disorders of bone density and structure, unspecified site: Secondary | ICD-10-CM | POA: Diagnosis not present

## 2021-06-18 DIAGNOSIS — Z0000 Encounter for general adult medical examination without abnormal findings: Secondary | ICD-10-CM

## 2021-06-18 LAB — VITAMIN D 25 HYDROXY (VIT D DEFICIENCY, FRACTURES): VITD: 32.16 ng/mL (ref 30.00–100.00)

## 2021-06-18 LAB — CBC WITH DIFFERENTIAL/PLATELET
Basophils Absolute: 0 10*3/uL (ref 0.0–0.1)
Basophils Relative: 0.5 % (ref 0.0–3.0)
Eosinophils Absolute: 0.1 10*3/uL (ref 0.0–0.7)
Eosinophils Relative: 2.3 % (ref 0.0–5.0)
HCT: 44.6 % (ref 36.0–46.0)
Hemoglobin: 14.5 g/dL (ref 12.0–15.0)
Lymphocytes Relative: 33.3 % (ref 12.0–46.0)
Lymphs Abs: 1.6 10*3/uL (ref 0.7–4.0)
MCHC: 32.6 g/dL (ref 30.0–36.0)
MCV: 91 fl (ref 78.0–100.0)
Monocytes Absolute: 0.4 10*3/uL (ref 0.1–1.0)
Monocytes Relative: 8.1 % (ref 3.0–12.0)
Neutro Abs: 2.8 10*3/uL (ref 1.4–7.7)
Neutrophils Relative %: 55.8 % (ref 43.0–77.0)
Platelets: 231 10*3/uL (ref 150.0–400.0)
RBC: 4.91 Mil/uL (ref 3.87–5.11)
RDW: 13.5 % (ref 11.5–15.5)
WBC: 5 10*3/uL (ref 4.0–10.5)

## 2021-06-18 LAB — LIPID PANEL
Cholesterol: 186 mg/dL (ref 0–200)
HDL: 75.2 mg/dL (ref >39.00)
LDL Cholesterol: 76 mg/dL (ref 0–99)
NonHDL: 110.4
Total CHOL/HDL Ratio: 2
Triglycerides: 171 mg/dL — ABNORMAL HIGH (ref 0.0–149.0)
VLDL: 34.2 mg/dL (ref 0.0–40.0)

## 2021-06-18 LAB — BASIC METABOLIC PANEL
BUN: 20 mg/dL (ref 6–23)
CO2: 27 mEq/L (ref 19–32)
Calcium: 9.8 mg/dL (ref 8.4–10.5)
Chloride: 106 mEq/L (ref 96–112)
Creatinine, Ser: 0.9 mg/dL (ref 0.40–1.20)
GFR: 65.5 mL/min (ref >60.00)
Glucose, Bld: 94 mg/dL (ref 70–99)
Potassium: 3.5 mEq/L (ref 3.5–5.1)
Sodium: 141 mEq/L (ref 135–145)

## 2021-06-18 LAB — HEPATIC FUNCTION PANEL
ALT: 13 U/L (ref 0–35)
AST: 17 U/L (ref 0–37)
Albumin: 4.4 g/dL (ref 3.5–5.2)
Alkaline Phosphatase: 40 U/L (ref 39–117)
Bilirubin, Direct: 0.2 mg/dL (ref 0.0–0.3)
Total Bilirubin: 1.6 mg/dL — ABNORMAL HIGH (ref 0.2–1.2)
Total Protein: 6.8 g/dL (ref 6.0–8.3)

## 2021-06-18 LAB — TSH: TSH: 2.28 u[IU]/mL (ref 0.35–5.50)

## 2021-06-18 NOTE — Addendum Note (Signed)
Addended by: Genevie Cheshire L on: 06/18/2021 02:18 PM   Modules accepted: Orders

## 2021-06-18 NOTE — Progress Notes (Signed)
   Subjective:    Patient ID: Alexandra [name], female    DOB: 04/23/1951, 70 y.o.   MRN: [id number]  HPI CPE- UTD on mammo, colonoscopy.  Due for DEXA.  UTD on PNA vaccines, Tdap.  Had first shingles shot in late Sept.  Due for flu.  Exercising regularly.  Patient Care Team    Relationship Specialty Notifications Start End  [name], MD PCP - General   07/23/10   [name], [facility] Pharmacist Pharmacist  11/22/19    Comment: PHONE NUMBER [phone]    Health Maintenance  Topic Date Due   COVID-19 Vaccine (5 - Booster for Pfizer series) 02/13/2021   INFLUENZA VACCINE  03/10/2021   DEXA SCAN  06/12/2021   Zoster Vaccines- Shingrix (1 of 2) 07/04/2021 (Originally 07/17/2002)   MAMMOGRAM  01/21/2023   COLONOSCOPY (Pts 45-57yrs Insurance coverage will need to be confirmed)  07/13/2026   TETANUS/TDAP  12/16/2026   Pneumonia Vaccine 57+ Years old  Completed   Hepatitis C Screening  Completed   HPV VACCINES  Aged Out      Review of Systems Patient reports no vision/ hearing changes, adenopathy,fever, weight change,  persistant/recurrent hoarseness , swallowing issues, chest pain, palpitations, edema, persistant/recurrent cough, hemoptysis, dyspnea (rest/exertional/paroxysmal nocturnal), gastrointestinal bleeding (melena, rectal bleeding), abdominal pain, significant heartburn, bowel changes, GU symptoms (dysuria, hematuria, incontinence), Gyn symptoms (abnormal  bleeding, pain),  syncope, focal weakness, memory loss, numbness & tingling, skin/hair/nail changes, abnormal bruising or bleeding, anxiety, or depression.   This visit occurred during the SARS-CoV-2 public health emergency.  Safety protocols were in place, including screening questions prior to the visit, additional usage of staff PPE, and extensive cleaning of exam room while observing appropriate contact time as indicated for disinfecting solutions.      Objective:   Physical Exam General Appearance:    Alert, cooperative,  no distress, appears stated age  Head:    Normocephalic, without obvious abnormality, atraumatic  Eyes:    PERRL, conjunctiva/corneas clear, EOM's intact, fundi    benign, both eyes  Ears:    Normal TM's and external ear canals, both ears  Nose:   Deferred due to COVID  Throat:   Neck:   Supple, symmetrical, trachea midline, no adenopathy;    Thyroid: no enlargement/tenderness/nodules  Back:     Symmetric, no curvature, ROM normal, no CVA tenderness  Lungs:     Clear to auscultation bilaterally, respirations unlabored  Chest Wall:    No tenderness or deformity   Heart:    Regular rate and rhythm, S1 and S2 normal, no murmur, rub   or gallop  Breast Exam:    Deferred to mammo  Abdomen:     Soft, non-tender, bowel sounds active all four quadrants,    no masses, no organomegaly  Genitalia:    Deferred  Rectal:    Extremities:   Extremities normal, atraumatic, no cyanosis or edema  Pulses:   2+ and symmetric all extremities  Skin:   Skin color, texture, turgor normal, no rashes or lesions  Lymph nodes:   Cervical, supraclavicular, and axillary nodes normal  Neurologic:   CNII-XII intact, normal strength, sensation and reflexes    throughout          Assessment & Plan:

## 2021-06-18 NOTE — Assessment & Plan Note (Signed)
Pt's PE WNL.  UTD on mammo, colonoscopy, Tdap, PNA.  Flu shot given today.  Check labs.  Anticipatory guidance provided.

## 2021-06-18 NOTE — Patient Instructions (Addendum)
Follow up in 6 months to recheck BP and cholesterol We'll notify you of your lab results and make any changes if needed Continue to work on healthy diet and regular exercise- you're doing great! We'll call you with your bone density appt Call with any questions or concerns Stay Safe!  Stay Healthy! Happy Holidays!!!

## 2021-06-18 NOTE — Assessment & Plan Note (Signed)
DEXA ordered.  Check Vit D and replete prn.

## 2021-06-18 NOTE — Assessment & Plan Note (Signed)
Chronic problem.  Tolerating statin w/o difficulty.  Check labs.  Adjust meds prn  

## 2021-06-27 ENCOUNTER — Encounter: Payer: Self-pay | Admitting: Family Medicine

## 2021-07-15 DIAGNOSIS — H401122 Primary open-angle glaucoma, left eye, moderate stage: Secondary | ICD-10-CM | POA: Diagnosis not present

## 2021-07-15 DIAGNOSIS — H53032 Strabismic amblyopia, left eye: Secondary | ICD-10-CM | POA: Diagnosis not present

## 2021-07-15 DIAGNOSIS — Z961 Presence of intraocular lens: Secondary | ICD-10-CM | POA: Diagnosis not present

## 2021-07-15 DIAGNOSIS — H401113 Primary open-angle glaucoma, right eye, severe stage: Secondary | ICD-10-CM | POA: Diagnosis not present

## 2021-07-15 DIAGNOSIS — H43813 Vitreous degeneration, bilateral: Secondary | ICD-10-CM | POA: Diagnosis not present

## 2021-07-22 DIAGNOSIS — D225 Melanocytic nevi of trunk: Secondary | ICD-10-CM | POA: Diagnosis not present

## 2021-07-22 DIAGNOSIS — D485 Neoplasm of uncertain behavior of skin: Secondary | ICD-10-CM | POA: Diagnosis not present

## 2021-08-12 ENCOUNTER — Ambulatory Visit: Payer: Medicare Other | Attending: Internal Medicine

## 2021-08-12 DIAGNOSIS — Z23 Encounter for immunization: Secondary | ICD-10-CM

## 2021-08-12 NOTE — Progress Notes (Signed)
° °  Covid-19 Vaccination Clinic  Name:  Alexandra [name]    MRN: [id number] DOB: 03-25-1951  08/12/2021  [name] was observed post Covid-19 immunization for 15 minutes without incident. She was provided with Vaccine Information Sheet and instruction to access the V-Safe system.   [name] was instructed to call 911 with any severe reactions post vaccine: Difficulty breathing  Swelling of face and throat  A fast heartbeat  A bad rash all over body  Dizziness and weakness   Immunizations Administered     Name Date Dose VIS Date Route   Pfizer Covid-19 Vaccine Bivalent Booster 08/12/2021 12:01 PM 0.3 mL 04/09/2021 Intramuscular   Manufacturer: [place]   Lot: [id number]   [place]: [phone]

## 2021-08-14 ENCOUNTER — Other Ambulatory Visit (HOSPITAL_BASED_OUTPATIENT_CLINIC_OR_DEPARTMENT_OTHER): Payer: Self-pay

## 2021-08-14 MED ORDER — PFIZER COVID-19 VAC BIVALENT 30 MCG/0.3ML IM SUSP
INTRAMUSCULAR | 0 refills | Status: DC
  Filled 2021-08-14: qty 0.3, 1d supply, fill #0

## 2021-08-21 ENCOUNTER — Telehealth: Payer: Self-pay | Admitting: Family Medicine

## 2021-08-21 NOTE — Telephone Encounter (Signed)
Pt need appt given sxs

## 2021-08-21 NOTE — Telephone Encounter (Signed)
Pt has been scheduled ELEA

## 2021-08-21 NOTE — Telephone Encounter (Signed)
I have not seen her for 2 months and if she is having a cough that doesn't respond to OTC cough meds, chances are it won't respond to prescription cough syrup and needs to be evaluated.  She can schedule an appt at her convenience

## 2021-08-21 NOTE — Telephone Encounter (Signed)
Pt called in asking if Dr. Birdie Riddle would call her in some cough medication she states that she has a dry cough and it has been going on for sometime. She talked with Birdie Riddle about this at her last visit.  Please advise

## 2021-08-21 NOTE — Telephone Encounter (Signed)
Called patient and she states that she has been taking nyquil and has taken ultratuss-DM. Not getting any better with OTC medications.   Would you like patient to set up a virtual or office visit?

## 2021-08-25 ENCOUNTER — Encounter: Payer: Self-pay | Admitting: Family Medicine

## 2021-08-25 ENCOUNTER — Ambulatory Visit (INDEPENDENT_AMBULATORY_CARE_PROVIDER_SITE_OTHER): Payer: Medicare Other | Admitting: Family Medicine

## 2021-08-25 VITALS — BP 124/80 | HR 67 | Temp 97.5°F | Resp 16 | Wt 181.8 lb

## 2021-08-25 DIAGNOSIS — R058 Other specified cough: Secondary | ICD-10-CM

## 2021-08-25 DIAGNOSIS — D225 Melanocytic nevi of trunk: Secondary | ICD-10-CM | POA: Insufficient documentation

## 2021-08-25 DIAGNOSIS — D099 Carcinoma in situ, unspecified: Secondary | ICD-10-CM | POA: Insufficient documentation

## 2021-08-25 DIAGNOSIS — Z87898 Personal history of other specified conditions: Secondary | ICD-10-CM | POA: Insufficient documentation

## 2021-08-25 DIAGNOSIS — Z85828 Personal history of other malignant neoplasm of skin: Secondary | ICD-10-CM | POA: Insufficient documentation

## 2021-08-25 DIAGNOSIS — D2370 Other benign neoplasm of skin of unspecified lower limb, including hip: Secondary | ICD-10-CM | POA: Insufficient documentation

## 2021-08-25 DIAGNOSIS — D2262 Melanocytic nevi of left upper limb, including shoulder: Secondary | ICD-10-CM | POA: Insufficient documentation

## 2021-08-25 MED ORDER — ALBUTEROL SULFATE HFA 108 (90 BASE) MCG/ACT IN AERS: 2.0000 | INHALATION_SPRAY | Freq: Four times a day (QID) | RESPIRATORY_TRACT | 0 refills | Status: DC | PRN

## 2021-08-25 MED ORDER — PREDNISONE 10 MG PO TABS: ORAL_TABLET | ORAL | 0 refills | Status: DC

## 2021-08-25 NOTE — Patient Instructions (Signed)
Follow up as needed or as scheduled START the Prednisone as directed- take w/ food USE the Albuterol inhaler as needed for coughing spells and chest tightness Drink LOTS of water Stay Safe!!  Stay Healthy! Happy New Year!!!

## 2021-08-25 NOTE — Progress Notes (Signed)
° °  Subjective:    Patient ID: Alexandra [name], female    DOB: [birth year], 71 y.o.   MRN: [id number]  HPI Cough- pt reports sxs started ~1 month ago w/ congestion, sneezing, and cough.  Had a scratchy throat.  Denies HA, fever.  Sxs improved w/ Nyquil and Zyrtec w/ exception of persistent cough.  Pt reports cough is now episodic.  Can go for hours w/o coughing but will have coughing jags that won't stop.  Able to sleep at night.  Cough is dry- not productive.  Denies SOB but does have some chest tightness.  The chest tightness will also come and go.   Review of Systems For ROS see HPI   This visit occurred during the SARS-CoV-2 public health emergency.  Safety protocols were in place, including screening questions prior to the visit, additional usage of staff PPE, and extensive cleaning of exam room while observing appropriate contact time as indicated for disinfecting solutions.      Objective:   Physical Exam Vitals reviewed.  Constitutional:      General: She is not in acute distress.    Appearance: Normal appearance. She is not ill-appearing.  HENT:     Head: Normocephalic and atraumatic.     Right Ear: Tympanic membrane and ear canal normal.     Left Ear: Tympanic membrane and ear canal normal.     Nose: Nose normal.  Eyes:     Extraocular Movements: Extraocular movements intact.     Conjunctiva/sclera: Conjunctivae normal.     Pupils: Pupils are equal, round, and reactive to light.  Cardiovascular:     Rate and Rhythm: Normal rate and regular rhythm.     Pulses: Normal pulses.  Pulmonary:     Effort: Pulmonary effort is normal. No respiratory distress.     Breath sounds: Normal breath sounds. No wheezing, rhonchi or rales.     Comments: No cough heard Musculoskeletal:     Cervical back: Neck supple.  Lymphadenopathy:     Cervical: No cervical adenopathy.  Skin:    General: Skin is warm and dry.  Neurological:     General: No focal deficit present.     Mental Status: She is  alert and oriented to person, place, and time.  Psychiatric:        Mood and Affect: Mood normal.        Behavior: Behavior normal.          Assessment & Plan:  Post-viral cough- new.  Reviewed dx and tx w/ pt.  Start Prednisone and albuterol for airway inflammation and symptomatic relief.  Reviewed supportive care and red flags that should prompt return.  Pt expressed understanding and is in agreement w/ plan.

## 2021-09-15 ENCOUNTER — Telehealth: Payer: Self-pay

## 2021-09-16 ENCOUNTER — Other Ambulatory Visit: Payer: Self-pay

## 2021-09-16 ENCOUNTER — Ambulatory Visit (HOSPITAL_BASED_OUTPATIENT_CLINIC_OR_DEPARTMENT_OTHER)
Admission: RE | Admit: 2021-09-16 | Discharge: 2021-09-16 | Disposition: A | Discharge status: Home / Self Care | Payer: Medicare Other | Source: Ambulatory Visit | Attending: Family Medicine | Admitting: Family Medicine

## 2021-09-16 ENCOUNTER — Ambulatory Visit (INDEPENDENT_AMBULATORY_CARE_PROVIDER_SITE_OTHER): Payer: Medicare Other | Admitting: Family Medicine

## 2021-09-16 ENCOUNTER — Encounter: Payer: Self-pay | Admitting: Family Medicine

## 2021-09-16 VITALS — BP 120/70 | HR 75 | Temp 98.1°F | Resp 16 | Wt 183.6 lb

## 2021-09-16 DIAGNOSIS — R052 Subacute cough: Secondary | ICD-10-CM | POA: Insufficient documentation

## 2021-09-16 DIAGNOSIS — R059 Cough, unspecified: Secondary | ICD-10-CM | POA: Diagnosis not present

## 2021-09-16 MED ORDER — AZITHROMYCIN 250 MG PO TABS: ORAL_TABLET | ORAL | 0 refills | Status: DC

## 2021-09-16 MED ORDER — OMEPRAZOLE 20 MG PO CPDR: 20.0000 mg | DELAYED_RELEASE_CAPSULE | Freq: Every day | ORAL | 3 refills | Status: DC

## 2021-09-16 MED ORDER — CETIRIZINE HCL 10 MG PO TABS: 10.0000 mg | ORAL_TABLET | Freq: Every day | ORAL | 11 refills | Status: DC

## 2021-09-16 NOTE — Telephone Encounter (Signed)
Error

## 2021-09-16 NOTE — Progress Notes (Signed)
° °  Subjective:    Patient ID: Alexandra [name], female    DOB: [birth year], 71 y.o.   MRN: [id number]  HPI Cough- pt was seen on 1/16 and tx'd w/ Prednisone taper and albuterol.  She feels the cough has worsened and she is now wheezing.  No fevers.  Cough is not productive.  Pt doesn't feel sick but will have head pressure when coughing.  Some nasal congestion.  Denies PND.  + GERD.  Pt is concerned about lung cancer given family hx   Review of Systems For ROS see HPI   This visit occurred during the SARS-CoV-2 public health emergency.  Safety protocols were in place, including screening questions prior to the visit, additional usage of staff PPE, and extensive cleaning of exam room while observing appropriate contact time as indicated for disinfecting solutions.      Objective:   Physical Exam Vitals reviewed.  Constitutional:      General: She is not in acute distress.    Appearance: Normal appearance. She is not ill-appearing.  HENT:     Head: Normocephalic and atraumatic.     Right Ear: Tympanic membrane and ear canal normal.     Left Ear: Tympanic membrane and ear canal normal.     Nose: Congestion present.     Mouth/Throat:     Mouth: Mucous membranes are moist.     Pharynx: No oropharyngeal exudate.     Comments: + PND Eyes:     Extraocular Movements: Extraocular movements intact.     Conjunctiva/sclera: Conjunctivae normal.     Pupils: Pupils are equal, round, and reactive to light.  Cardiovascular:     Rate and Rhythm: Normal rate and regular rhythm.     Pulses: Normal pulses.     Heart sounds: Normal heart sounds.  Pulmonary:     Effort: Pulmonary effort is normal. No respiratory distress.     Breath sounds: Normal breath sounds. No wheezing, rhonchi or rales.  Musculoskeletal:     Cervical back: Normal range of motion and neck supple.  Lymphadenopathy:     Cervical: No cervical adenopathy.  Skin:    General: Skin is warm and dry.  Neurological:     General: No focal  deficit present.     Mental Status: She is alert and oriented to person, place, and time.  Psychiatric:        Behavior: Behavior normal.        Thought Content: Thought content normal.     Comments: anxious          Assessment & Plan:   Subacute cough- deteriorated.  Pt feels sxs worsened rather than improved w/ the prednisone.  Discussed common causes of cough- PND, GERD, airway irritation/bronchospasm- and talked about the possibility of an atypical infxn.  Will get CXR to assess.  Start Zpack for possibility of atypical infxn, start daily PPI and antihistamine to treat possible contributing factors.  If cough persists, will refer to pulmonary or get CT to assess (as pt is worried about possible lung cancer).  Pt expressed understanding and is in agreement w/ plan.

## 2021-09-16 NOTE — Patient Instructions (Signed)
[facility] to [facility] to get your [facility] x[facility] START the Zpack to cover for any atypical infections Continue to use the albuterol inhaler if you have a coughing fit START daily Cetirizine (Zyrtec)- you can use prescription if cheaper (I sent this in) or OTC START daily Omeprazole- same as above Drink LOTS of water Call with any questions or concerns Hang in there!

## 2021-09-18 ENCOUNTER — Encounter: Payer: Self-pay | Admitting: Family Medicine

## 2021-09-18 DIAGNOSIS — R052 Subacute cough: Secondary | ICD-10-CM

## 2021-09-22 ENCOUNTER — Other Ambulatory Visit: Payer: Self-pay | Admitting: Family Medicine

## 2021-09-23 ENCOUNTER — Other Ambulatory Visit: Payer: Self-pay

## 2021-09-23 MED ORDER — ALBUTEROL SULFATE HFA 108 (90 BASE) MCG/ACT IN AERS: 2.0000 | INHALATION_SPRAY | Freq: Four times a day (QID) | RESPIRATORY_TRACT | 0 refills | Status: DC | PRN

## 2021-09-23 MED ORDER — BUDESONIDE-FORMOTEROL FUMARATE 160-4.5 MCG/ACT IN AERO: 2.0000 | INHALATION_SPRAY | Freq: Two times a day (BID) | RESPIRATORY_TRACT | 1 refills | Status: DC

## 2021-09-23 NOTE — Telephone Encounter (Signed)
Pt is still not feeling better and has finished Zpak should she have a follow up or try another medication given continued sxs? Please advise

## 2021-09-24 ENCOUNTER — Telehealth: Payer: Self-pay | Admitting: Family Medicine

## 2021-09-24 DIAGNOSIS — G4733 Obstructive sleep apnea (adult) (pediatric): Secondary | ICD-10-CM | POA: Diagnosis not present

## 2021-09-24 NOTE — Telephone Encounter (Signed)
This is of course her decision, but according to the [employer] group (who screened 623 studies of over [phone],665 patients in a 2021 meta analysis) they refuted these claims.  Long term use of high dose steroids may be problematic but they did not see a problem w/ the use of inhaled steroids- particularly in the short term.  Again, totally her decision and I understand the concern.  We can wait and see what pulmonary says about her ongoing cough

## 2021-09-24 NOTE — Telephone Encounter (Signed)
Pt called in stating that the symbicort states talk to your medical professional if you have glaucoma. She states that she reached out to Dr. Katy Fitch and he told her it would raise the pressure in her eyes not to take it. Pt wanted to make Tabori aware that she will not be taking this medications

## 2021-09-24 NOTE — Telephone Encounter (Signed)
States she will not use it due to opthalmology stating he would not recommend unless absolutely necessary

## 2021-09-29 ENCOUNTER — Ambulatory Visit (INDEPENDENT_AMBULATORY_CARE_PROVIDER_SITE_OTHER): Payer: Medicare Other | Admitting: Family Medicine

## 2021-09-29 ENCOUNTER — Encounter: Payer: Self-pay | Admitting: Family Medicine

## 2021-09-29 VITALS — BP 118/78 | HR 67 | Temp 97.6°F | Resp 16 | Wt 182.8 lb

## 2021-09-29 DIAGNOSIS — N644 Mastodynia: Secondary | ICD-10-CM

## 2021-09-29 NOTE — Patient Instructions (Signed)
Follow up as needed or as scheduled We'll call you with your diagnostic mammo appt Ibuprofen can help w/ breast pain Limit caffeine as this can worsen breast pain Call with any questions or concerns Hang in there!

## 2021-09-29 NOTE — Progress Notes (Signed)
° °  Subjective:    Patient ID: Alexandra [name], female    DOB: May 24, 1951, 71 y.o.   MRN: [id number]  HPI L breast soreness- 'it's been bothering me for awhile now'.  Pt reports known cyst over the last 30+ yrs.  When she lived in [place] they would aspirate it when it became painful.  Sxs started 2-3 months ago.  Pain is lateral.  No skin changes- no redness or dimpling.  Pt denies palpable lump.  No TTP.  Described as an ache.   Review of Systems For ROS see HPI   This visit occurred during the SARS-CoV-2 public health emergency.  Safety protocols were in place, including screening questions prior to the visit, additional usage of staff PPE, and extensive cleaning of exam room while observing appropriate contact time as indicated for disinfecting solutions.      Objective:   Physical Exam Vitals reviewed.  Constitutional:      General: She is not in acute distress.    Appearance: Normal appearance. She is not ill-appearing.  HENT:     Head: Normocephalic and atraumatic.  Eyes:     Extraocular Movements: Extraocular movements intact.     Conjunctiva/sclera: Conjunctivae normal.     Pupils: Pupils are equal, round, and reactive to light.  Chest:     Chest wall: Tenderness present. No mass or swelling.    Skin:    General: Skin is warm and dry.     Findings: No erythema or rash.  Neurological:     General: No focal deficit present.     Mental Status: She is alert and oriented to person, place, and time.  Psychiatric:        Mood and Affect: Mood normal.        Behavior: Behavior normal.        Thought Content: Thought content normal.          Assessment & Plan:   Breast Pain- new.  Pt has hx of cyst in that breast and she feels this may be a recurrence of fluid accumulation.  UTD on mammograms but will get diagnostic mammo to be sure.  Pt expressed understanding and is in agreement w/ plan.

## 2021-10-01 ENCOUNTER — Other Ambulatory Visit: Payer: Self-pay | Admitting: Family Medicine

## 2021-10-01 DIAGNOSIS — N644 Mastodynia: Secondary | ICD-10-CM

## 2021-10-02 ENCOUNTER — Encounter: Payer: Self-pay | Admitting: Adult Health

## 2021-10-02 ENCOUNTER — Ambulatory Visit: Payer: Medicare Other | Admitting: Adult Health

## 2021-10-02 ENCOUNTER — Other Ambulatory Visit (INDEPENDENT_AMBULATORY_CARE_PROVIDER_SITE_OTHER): Payer: Medicare Other

## 2021-10-02 ENCOUNTER — Other Ambulatory Visit: Payer: Self-pay

## 2021-10-02 VITALS — BP 120/62 | HR 75 | Temp 98.3°F | Ht 64.0 in | Wt 182.6 lb

## 2021-10-02 DIAGNOSIS — J4520 Mild intermittent asthma, uncomplicated: Secondary | ICD-10-CM

## 2021-10-02 DIAGNOSIS — R053 Chronic cough: Secondary | ICD-10-CM | POA: Insufficient documentation

## 2021-10-02 DIAGNOSIS — G4733 Obstructive sleep apnea (adult) (pediatric): Secondary | ICD-10-CM | POA: Diagnosis not present

## 2021-10-02 MED ORDER — MONTELUKAST SODIUM 10 MG PO TABS: 10.0000 mg | ORAL_TABLET | Freq: Every day | ORAL | 5 refills | Status: DC

## 2021-10-02 MED ORDER — BENZONATATE 200 MG PO CAPS: 200.0000 mg | ORAL_CAPSULE | Freq: Three times a day (TID) | ORAL | 1 refills | Status: DC | PRN

## 2021-10-02 NOTE — Progress Notes (Signed)
@Patient  ID: Alexandra [name], female    DOB: [birth year], 71 y.o.   MRN: [id number]  Chief Complaint  Patient presents with   Follow-up    Referring provider: [name], MD  HPI: 71 year old female followed for obstructive sleep apnea on nocturnal CPAP  TEST/EVENTS :  HST 10/02/17 >> AHI 8, SaO2 low 81%.  10/02/2021 Acute OV : Cough  Patient returns for an acute office visit .  Complains of a cough that will not go away over the last 3 months.  Patient says she initially had cold-like symptoms.  Seen by her primary care provider several times without significant change in cough.  She is been treated with steroids, Zyrtec, Prilosec.  She was given albuterol inhaler.  She was also recommended to start Symbicort.  She did not start Symbicort because she has severe glaucoma and her ophthalmologist did not recommend it.  Patient says she has a minimally productive cough that comes and goes throughout the day.  Seems to be aggravated by prolonged talking, cold air.  Cough is very aggravating.  She is currently not using any cough medicines.  She has had a chest x-ray recently that was clear.  She did do a home COVID test that was negative.  She does have some sinus drainage.  She denies any fever, hemoptysis, orthopnea, edema, teeth pain. Exhaled nitric oxide testing was elevated today in office at 66 PPB    She is followed for OSA.  Patient is on CPAP therapy for her underlying sleep apnea.   She feels rested with no significant daytime sleepiness.  Feels that she benefits from CPAP. CPAP download shows excellent compliance with 93% usage.  Daily average usage at 7 hours.  Patient is on auto CPAP 5 to 15 cm H2O.  Daily average pressure at 14.6 cm H2O.  AHI 0.5/hour.      No Known Allergies  Immunization History  Administered Date(s) Administered   Fluad Quad(high Dose 65+) 05/15/2020, 06/18/2021   Influenza Whole 06/30/2007   Influenza,inj,Quad PF,6+ Mos 05/12/2013, 05/14/2014,  06/01/2014, 06/10/2015, 06/17/2016, 05/12/2017, 05/18/2018, 04/04/2019   PFIZER SARS-COV-2 Pediatric Vaccination 5-78yrs 06/14/2020   PFIZER(Purple Top)SARS-COV-2 Vaccination 08/30/2019, 09/20/2019, 06/14/2020, 12/19/2020   Pfizer Covid-19 Vaccine Bivalent Booster 45yrs & up 08/12/2021   Pneumococcal Conjugate-13 08/11/2017   Pneumococcal Polysaccharide-23 04/04/2019   Td 09/11/2006   Tdap 12/15/2016   Zoster Recombinat (Shingrix) 05/08/2021, 09/03/2021   Zoster, Live 05/08/2011    Past Medical History:  Diagnosis Date   Cataract    REMOVED BOTH - LENS IMPLANTS   Depression    Family history of ovarian cancer    Glaucoma    Hyperlipidemia    Hypertension    OSA (obstructive sleep apnea) 10/05/2017   Osteopenia    Sleep apnea    WEARS CPAP    Tobacco History: Social History   Tobacco Use  Smoking Status Never  Smokeless Tobacco Never   Counseling given: Not Answered   Outpatient Medications Prior to Visit  Medication Sig Dispense Refill   acetaZOLAMIDE (DIAMOX) 500 MG capsule Take 500 mg by mouth 2 (two) times daily.     albuterol (VENTOLIN HFA) 108 (90 Base) MCG/ACT inhaler Inhale 2 puffs into the lungs every 6 (six) hours as needed for wheezing or shortness of breath. 8 g 0   Ascorbic Acid (VITAMIN C) 1000 MG tablet Take 1,000 mg by mouth daily.     B Complex-C (SUPER B COMPLEX PO) Take by mouth.  brimonidine (ALPHAGAN) 0.2 % ophthalmic solution   3   Calcium Carbonate-Vit D-Min (CALCIUM 1200 PO) Take by mouth.     cetirizine (ZYRTEC) 10 MG tablet Take 1 tablet (10 mg total) by mouth daily. 30 tablet 11   dorzolamide-timolol (COSOPT) 22.3-6.8 MG/ML ophthalmic solution   1   Glucosamine-Chondroit-Vit C-Mn (GLUCOSAMINE 1500 COMPLEX) CAPS Take by mouth.     hydrochlorothiazide (HYDRODIURIL) 12.5 MG tablet TAKE 1 TABLET BY MOUTH  DAILY 90 tablet 3   LUMIGAN 0.01 % SOLN      Multiple Vitamins-Minerals (MULTIVITAMIN ADULT PO) Take by mouth.     omeprazole (PRILOSEC) 20  MG capsule Take 1 capsule (20 mg total) by mouth daily. 30 capsule 3   RHOPRESSA 0.02 % SOLN      simvastatin (ZOCOR) 20 MG tablet TAKE 1 TABLET BY MOUTH AT  BEDTIME 90 tablet 3   tretinoin (RETIN-A) 0.05 % cream Apply topically at bedtime.     valACYclovir (VALTREX) 1000 MG tablet SMARTSIG:2 Tablet(s) By Mouth     vitamin E 400 UNIT capsule Take 400 Units by mouth daily.     zolpidem (AMBIEN CR) 6.25 MG CR tablet Take 1 tablet (6.25 mg total) by mouth at bedtime as needed for sleep. 30 tablet 1   No facility-administered medications prior to visit.     Review of Systems:   Constitutional:   No  weight loss, night sweats,  Fevers, chills,  +fatigue, or  lassitude.  HEENT:   No headaches,  Difficulty swallowing,  Tooth/dental problems, or  Sore throat,                No sneezing, itching, ear ache,  +nasal congestion, post nasal drip,   CV:  No chest pain,  Orthopnea, PND, swelling in lower extremities, anasarca, dizziness, palpitations, syncope.   GI  No heartburn, indigestion, abdominal pain, nausea, vomiting, diarrhea, change in bowel habits, loss of appetite, bloody stools.   Resp: .  No chest wall deformity  Skin: no rash or lesions.  GU: no dysuria, change in color of urine, no urgency or frequency.  No flank pain, no hematuria   MS:  No joint pain or swelling.  No decreased range of motion.  No back pain.    Physical Exam  BP 120/62 (BP Location: Left Arm, Patient Position: Sitting, Cuff Size: Normal)    Pulse 75    Temp 98.3 F (36.8 C) (Oral)    Ht 5\' 4"  (1.626 m)    Wt 182 lb 9.6 oz (82.8 kg)    SpO2 99%    BMI 31.34 kg/m   GEN: A/Ox3; pleasant , NAD, well nourished    HEENT:  Speed/AT,  NOSE-clear, THROAT-clear, no lesions, no postnasal drip or exudate noted.  Class 2-3 MP airway   NECK:  Supple w/ fair ROM; no JVD; normal carotid impulses w/o bruits; no thyromegaly or nodules palpated; no lymphadenopathy.    RESP  Clear  P & A; w/o, wheezes/ rales/ or rhonchi.  no accessory muscle use, no dullness to percussion  CARD:  RRR, no m/r/g, no peripheral edema, pulses intact, no cyanosis or clubbing.  GI:   Soft & nt; nml bowel sounds; no organomegaly or masses detected.   Musco: Warm bil, no deformities or joint swelling noted.   Neuro: alert, no focal deficits noted.    Skin: Warm, no lesions or rashes    Lab Results:    BNP No results found for: BNP  ProBNP No results found  for: PROBNP  Imaging: DG Chest 2 View  Result Date: 09/17/2021 CLINICAL DATA:  Persistent cough. EXAM: CHEST - 2 VIEW COMPARISON:  None. FINDINGS: The heart size and mediastinal contours are within normal limits. Both lungs are clear. The visualized skeletal structures are unremarkable. IMPRESSION: No active cardiopulmonary disease. Electronically Signed   By: Marijo Conception M.D.   On: 09/17/2021 15:38      No flowsheet data found.  No results found for: NITRICOXIDE  At about that    Assessment & Plan:   OSA (obstructive sleep apnea) Obstructive sleep apnea excellent control compliance on CPAP  Plan  Patient Instructions  Begin Singulair 10mg  daily .  Continue on Zyrtec 10 mg daily Begin Flonase 2 puffs daily Albuterol inhaler 1 to 2 puffs as needed every 6 hours Labs today Continue on Prilosec daily Delsym 2 teaspoons twice daily as needed for cough Tessalon Perles 3 times a day as needed for cough Sips of water to soothe the throat and avoid coughing and throat clearing Avoid mint products Follow up with Dr. Halford Chessman  or Telena Peyser NP in 4 weeks with PFT and  As needed     Keep up the good work Wear your CPAP machine all night Work on healthy weight Do not drive if sleepy Follow-up with Dr. Halford Chessman in 1 year and As needed       Chronic cough Chronic cough x3 months.  Suspect patient may have an asthmatic component with elevated exhaled nitric oxide testing.Marland Kitchen  Unfortunately she has severe glaucoma and does not want to use ICS inhaler.  For now  would try Singulair 10 mg daily.  Continue on Zyrtec.  May also use some Flonase. If symptoms or not improved would definitely consider adding in Symbicort and will have to discuss with ophthalmology regarding her glaucoma. Add Delsym and Tessalon for cough control Check PFTs on return  Plan  Patient Instructions  Begin Singulair 10mg  daily .  Continue on Zyrtec 10 mg daily Begin Flonase 2 puffs daily Albuterol inhaler 1 to 2 puffs as needed every 6 hours Labs today Continue on Prilosec daily Delsym 2 teaspoons twice daily as needed for cough Tessalon Perles 3 times a day as needed for cough Sips of water to soothe the throat and avoid coughing and throat clearing Avoid mint products Follow up with Dr. Halford Chessman  or Nilam Quakenbush NP in 4 weeks with PFT and  As needed     Keep up the good work Wear your CPAP machine all night Work on healthy weight Do not drive if sleepy Follow-up with Dr. Halford Chessman in 1 year and As needed          I spent  38  minutes dedicated to the care of this patient on the date of this encounter to include pre-visit review of records, face-to-face time with the patient discussing conditions above, post visit ordering of testing, clinical documentation with the electronic health record, making appropriate referrals as documented, and communicating necessary findings to members of the patients care team.    Rexene Edison, NP 10/02/2021

## 2021-10-02 NOTE — Patient Instructions (Addendum)
Begin Singulair 10mg  daily .  Continue on Zyrtec 10 mg daily Begin Flonase 2 puffs daily Albuterol inhaler 1 to 2 puffs as needed every 6 hours Labs today Continue on Prilosec daily Delsym 2 teaspoons twice daily as needed for cough Tessalon Perles 3 times a day as needed for cough Sips of water to soothe the throat and avoid coughing and throat clearing Avoid mint products Follow up with Dr. Halford Chessman  or Emie Sommerfeld NP in 4 weeks with PFT and  As needed     Keep up the good work Wear your CPAP machine all night Work on healthy weight Do not drive if sleepy Follow-up with Dr. Halford Chessman in 1 year and As needed

## 2021-10-02 NOTE — Assessment & Plan Note (Signed)
Chronic cough x3 months.  Suspect patient may have an asthmatic component with elevated exhaled nitric oxide testing.Marland Kitchen  Unfortunately she has severe glaucoma and does not want to use ICS inhaler.  For now would try Singulair 10 mg daily.  Continue on Zyrtec.  May also use some Flonase. If symptoms or not improved would definitely consider adding in Symbicort and will have to discuss with ophthalmology regarding her glaucoma. Add Delsym and Tessalon for cough control Check PFTs on return  Plan  Patient Instructions  Begin Singulair 10mg  daily .  Continue on Zyrtec 10 mg daily Begin Flonase 2 puffs daily Albuterol inhaler 1 to 2 puffs as needed every 6 hours Labs today Continue on Prilosec daily Delsym 2 teaspoons twice daily as needed for cough Tessalon Perles 3 times a day as needed for cough Sips of water to soothe the throat and avoid coughing and throat clearing Avoid mint products Follow up with Dr. Halford Chessman  or Ralyn Stlaurent NP in 4 weeks with PFT and  As needed     Keep up the good work Wear your CPAP machine all night Work on healthy weight Do not drive if sleepy Follow-up with Dr. Halford Chessman in 1 year and As needed

## 2021-10-02 NOTE — Assessment & Plan Note (Signed)
Obstructive sleep apnea excellent control compliance on CPAP  Plan  Patient Instructions  Begin Singulair 10mg  daily .  Continue on Zyrtec 10 mg daily Begin Flonase 2 puffs daily Albuterol inhaler 1 to 2 puffs as needed every 6 hours Labs today Continue on Prilosec daily Delsym 2 teaspoons twice daily as needed for cough Tessalon Perles 3 times a day as needed for cough Sips of water to soothe the throat and avoid coughing and throat clearing Avoid mint products Follow up with Dr. Halford Chessman  or Winson Eichorn NP in 4 weeks with PFT and  As needed     Keep up the good work Wear your CPAP machine all night Work on healthy weight Do not drive if sleepy Follow-up with Dr. Halford Chessman in 1 year and As needed

## 2021-10-03 LAB — RESPIRATORY ALLERGY PROFILE REGION II ~~LOC~~

## 2021-10-03 LAB — CBC WITH DIFFERENTIAL/PLATELET
Basophils Absolute: 0.1 10*3/uL (ref 0.0–0.1)
Basophils Relative: 1.4 % (ref 0.0–3.0)
Eosinophils Absolute: 0.4 10*3/uL (ref 0.0–0.7)
Eosinophils Relative: 5.5 % — ABNORMAL HIGH (ref 0.0–5.0)
HCT: 38.9 % (ref 36.0–46.0)
Hemoglobin: 12.8 g/dL (ref 12.0–15.0)
Lymphocytes Relative: 27.6 % (ref 12.0–46.0)
Lymphs Abs: 2 10*3/uL (ref 0.7–4.0)
MCHC: 33 g/dL (ref 30.0–36.0)
MCV: 89.1 fl (ref 78.0–100.0)
Monocytes Absolute: 0.5 10*3/uL (ref 0.1–1.0)
Monocytes Relative: 7.4 % (ref 3.0–12.0)
Neutro Abs: 4.1 10*3/uL (ref 1.4–7.7)
Neutrophils Relative %: 58.1 % (ref 43.0–77.0)
Platelets: 305 10*3/uL (ref 150.0–400.0)
RBC: 4.37 Mil/uL (ref 3.87–5.11)
RDW: 13.5 % (ref 11.5–15.5)
WBC: 7.1 10*3/uL (ref 4.0–10.5)

## 2021-10-03 LAB — INTERPRETATION:

## 2021-10-03 NOTE — Progress Notes (Signed)
Reviewed and agree with assessment/plan.   Chesley Mires, MD [facility] Pulmonary/Critical Care 10/03/2021, 7:12 AM Pager:  [phone]

## 2021-10-06 NOTE — Telephone Encounter (Signed)
Advised of results

## 2021-10-08 ENCOUNTER — Ambulatory Visit: Admission: RE | Admit: 2021-10-08 | Payer: Medicare Other | Source: Ambulatory Visit

## 2021-10-08 ENCOUNTER — Ambulatory Visit
Admission: RE | Admit: 2021-10-08 | Discharge: 2021-10-08 | Disposition: A | Discharge status: Home / Self Care | Payer: Medicare Other | Source: Ambulatory Visit | Attending: Family Medicine | Admitting: Family Medicine

## 2021-10-08 DIAGNOSIS — N644 Mastodynia: Secondary | ICD-10-CM | POA: Diagnosis not present

## 2021-10-08 DIAGNOSIS — R922 Inconclusive mammogram: Secondary | ICD-10-CM | POA: Diagnosis not present

## 2021-11-03 ENCOUNTER — Other Ambulatory Visit: Payer: Self-pay | Admitting: Family Medicine

## 2021-11-03 ENCOUNTER — Other Ambulatory Visit: Payer: Self-pay | Admitting: Pulmonary Disease

## 2021-11-03 DIAGNOSIS — J4520 Mild intermittent asthma, uncomplicated: Secondary | ICD-10-CM

## 2021-11-03 DIAGNOSIS — Z1231 Encounter for screening mammogram for malignant neoplasm of breast: Secondary | ICD-10-CM

## 2021-11-04 ENCOUNTER — Ambulatory Visit: Payer: Medicare Other | Admitting: Adult Health

## 2021-11-04 ENCOUNTER — Other Ambulatory Visit: Payer: Self-pay

## 2021-11-04 ENCOUNTER — Encounter: Payer: Self-pay | Admitting: Adult Health

## 2021-11-04 ENCOUNTER — Ambulatory Visit (INDEPENDENT_AMBULATORY_CARE_PROVIDER_SITE_OTHER): Payer: Medicare Other | Admitting: Pulmonary Disease

## 2021-11-04 DIAGNOSIS — J4520 Mild intermittent asthma, uncomplicated: Secondary | ICD-10-CM | POA: Diagnosis not present

## 2021-11-04 DIAGNOSIS — G4733 Obstructive sleep apnea (adult) (pediatric): Secondary | ICD-10-CM

## 2021-11-04 DIAGNOSIS — J309 Allergic rhinitis, unspecified: Secondary | ICD-10-CM | POA: Diagnosis not present

## 2021-11-04 DIAGNOSIS — J45991 Cough variant asthma: Secondary | ICD-10-CM | POA: Diagnosis not present

## 2021-11-04 LAB — PULMONARY FUNCTION TEST
DL/VA % pred: 128 %
DL/VA: 5.42 ml/min/mmHg/L
DLCO cor % pred: 128 %
DLCO cor: 23.4 ml/min/mmHg
DLCO unc % pred: 125 %
DLCO unc: 22.96 ml/min/mmHg
FEF 25-75 Post: 1.88 L/sec
FEF 25-75 Pre: 2.19 L/sec
FEF2575-%Change-Post: -14 %
FEF2575-%Pred-Post: 102 %
FEF2575-%Pred-Pre: 119 %
FEV1-%Change-Post: -2 %
FEV1-%Pred-Post: 105 %
FEV1-%Pred-Pre: 107 %
FEV1-Post: 2.22 L
FEV1-Pre: 2.28 L
FEV1FVC-%Change-Post: 2 %
FEV1FVC-%Pred-Pre: 104 %
FEV6-%Change-Post: -4 %
FEV6-%Pred-Post: 101 %
FEV6-%Pred-Pre: 107 %
FEV6-Post: 2.72 L
FEV6-Pre: 2.85 L
FEV6FVC-%Pred-Post: 104 %
FEV6FVC-%Pred-Pre: 104 %
FVC-%Change-Post: -4 %
FVC-%Pred-Post: 97 %
FVC-%Pred-Pre: 102 %
FVC-Post: 2.72 L
FVC-Pre: 2.85 L
Post FEV1/FVC ratio: 82 %
Post FEV6/FVC ratio: 100 %
Pre FEV1/FVC ratio: 80 %
Pre FEV6/FVC Ratio: 100 %
RV % pred: 94 %
RV: 1.94 L
TLC % pred: 102 %
TLC: 4.85 L

## 2021-11-04 NOTE — Assessment & Plan Note (Signed)
Healthy sleep regimen discussed.  Continue on CPAP at bedtime ? ?Plan  ?Patient Instructions  ?Singulair '10mg'$  daily .  ?Zyrtec 10 mg daily ?Flonase 2 puffs daily ?Albuterol inhaler 1 to 2 puffs as needed every 6 hours ?Change Prilosec daily as needed.  ?Delsym 2 teaspoons twice daily as needed for cough ?Tessalon Perles 3 times a day as needed for cough ?Sips of water to soothe the throat and avoid coughing and throat clearing ?Avoid mint products ? ?Keep up the good work ?Wear your CPAP machine all night ?Work on healthy weight ?Do not drive if sleepy ?Follow up with Dr. Halford [name]  or Alexandra Plemmons NP in 6 months and  As needed   ? ? ?  ? ?

## 2021-11-04 NOTE — Patient Instructions (Addendum)
Singulair '10mg'$  daily .  ?Zyrtec 10 mg daily ?Flonase 2 puffs daily ?Albuterol inhaler 1 to 2 puffs as needed every 6 hours ?Change Prilosec daily as needed.  ?Delsym 2 teaspoons twice daily as needed for cough ?Tessalon Perles 3 times a day as needed for cough ?Sips of water to soothe the throat and avoid coughing and throat clearing ?Avoid mint products ? ?Keep up the good work ?Wear your CPAP machine all night ?Work on healthy weight ?Do not drive if sleepy ?Follow up with Dr. Halford Chessman  or Kennie Karapetian NP in 6 months and  As needed   ? ? ?

## 2021-11-04 NOTE — Assessment & Plan Note (Signed)
Controlled on current regimen. ? ?Plan  ?Marland Kitchen ?Patient Instructions  ?Singulair '10mg'$  daily .  ?Zyrtec 10 mg daily ?Flonase 2 puffs daily ?Albuterol inhaler 1 to 2 puffs as needed every 6 hours ?Change Prilosec daily as needed.  ?Delsym 2 teaspoons twice daily as needed for cough ?Tessalon Perles 3 times a day as needed for cough ?Sips of water to soothe the throat and avoid coughing and throat clearing ?Avoid mint products ? ?Keep up the good work ?Wear your CPAP machine all night ?Work on healthy weight ?Do not drive if sleepy ?Follow up with Dr. Halford Chessman  or Orville Widmann NP in 6 months and  As needed   ? ? ?  ? ?

## 2021-11-04 NOTE — Assessment & Plan Note (Signed)
Cough variant asthma with significant improvement with control of triggers with allergic rhinitis.  Patient is significantly improved with resolution of cough on Singulair.  Continue on Flonase and Zyrtec. ? ?Plan  ?Patient Instructions  ?Singulair '10mg'$  daily .  ?Zyrtec 10 mg daily ?Flonase 2 puffs daily ?Albuterol inhaler 1 to 2 puffs as needed every 6 hours ?Change Prilosec daily as needed.  ?Delsym 2 teaspoons twice daily as needed for cough ?Tessalon Perles 3 times a day as needed for cough ?Sips of water to soothe the throat and avoid coughing and throat clearing ?Avoid mint products ? ?Keep up the good work ?Wear your CPAP machine all night ?Work on healthy weight ?Do not drive if sleepy ?Follow up with Dr. Halford Chessman  or Hanley Rispoli NP in 6 months and  As needed   ? ? ?  ? ?

## 2021-11-04 NOTE — Progress Notes (Signed)
? ?'@Patient'$  ID: Alexandra [name], female    DOB: 10/03/1950, 71 y.o.   MRN: [id number] ? ?Chief Complaint  ?Patient presents with  ? Follow-up  ? ? ?Referring provider: ?[name], MD ? ?HPI: ?71 year old female followed for obstructive sleep apnea and chronic cough ? ?TEST/EVENTS :  ?HST 10/02/17 >> AHI 8, SaO2 low 81%. ? ?11/04/2021 Follow up : Chronic cough  ?Patient returns for a 1 month follow-up.  Patient was seen last visit for a chronic cough for 3 months.  She was initially treated for cold-like symptoms.  She was started on Zyrtec Prilosec albuterol inhaler.  She was also started on Symbicort but did not start this due to glaucoma.  Chest x-ray was clear.  COVID testing was negative.  Exhaled nitric oxide testing was elevated at 90 PPB.  Last visit patient was started on Singulair.  Continued on Zyrtec Flonase and albuterol.  She was recommended on cough control regimen with Delsym and Tessalon Perles.  She was set up for pulmonary function testing that was completed today that showed normal lung function with no airflow obstruction or restriction.  FEV1 was 107%, ratio 80, FVC 102%, no significant bronchodilator response, DLCO elevated at 125%.  ?She returns today much better , cough has totally resolved. Stopped using Albuterol inhaler .significant improvement .  ?Remains on CPAP , wears 7 hrs each night. CPAP download last office visit showed excellent control and compliance.  ? ? ? ?No Known Allergies ? ?Immunization History  ?Administered Date(s) Administered  ? Fluad Quad(high Dose 65+) 05/15/2020, 06/18/2021  ? Influenza Whole 06/30/2007  ? Influenza,inj,Quad PF,6+ Mos 05/12/2013, 05/14/2014, 06/01/2014, 06/10/2015, 06/17/2016, 05/12/2017, 05/18/2018, 04/04/2019  ? PFIZER SARS-COV-2 Pediatric Vaccination 5-52yrs 06/14/2020  ? PFIZER(Purple Top)SARS-COV-2 Vaccination 08/30/2019, 09/20/2019, 06/14/2020, 12/19/2020  ? Pension scheme manager 35yrs & up 08/12/2021  ? Pneumococcal  Conjugate-13 08/11/2017  ? Pneumococcal Polysaccharide-23 04/04/2019  ? Td 09/11/2006  ? Tdap 12/15/2016  ? Zoster Recombinat (Shingrix) 05/08/2021, 09/03/2021  ? Zoster, Live 05/08/2011  ? ? ?Past Medical History:  ?Diagnosis Date  ? Cataract   ? REMOVED BOTH - LENS IMPLANTS  ? Depression   ? Family history of ovarian cancer   ? Glaucoma   ? Hyperlipidemia   ? Hypertension   ? OSA (obstructive sleep apnea) 10/05/2017  ? Osteopenia   ? Sleep apnea   ? WEARS CPAP  ? ? ?Tobacco History: ?Social History  ? ?Tobacco Use  ?Smoking Status Never  ?Smokeless Tobacco Never  ? ?Counseling given: Not Answered ? ? ?Outpatient Medications Prior to Visit  ?Medication Sig Dispense Refill  ? acetaZOLAMIDE (DIAMOX) 500 MG capsule Take 500 mg by mouth 2 (two) times daily.    ? albuterol (VENTOLIN HFA) 108 (90 Base) MCG/ACT inhaler Inhale 2 puffs into the lungs every 6 (six) hours as needed for wheezing or shortness of breath. 8 g 0  ? Ascorbic Acid (VITAMIN C) 1000 MG tablet Take 1,000 mg by mouth daily.    ? B Complex-C (SUPER B COMPLEX PO) Take by mouth.    ? benzonatate (TESSALON) 200 MG capsule Take 1 capsule (200 mg total) by mouth 3 (three) times daily as needed for cough. 45 capsule 1  ? brimonidine (ALPHAGAN) 0.2 % ophthalmic solution   3  ? Calcium Carbonate-Vit D-Min (CALCIUM 1200 PO) Take by mouth.    ? cetirizine (ZYRTEC) 10 MG tablet Take 1 tablet (10 mg total) by mouth daily. 30 tablet 11  ? dorzolamide-timolol (COSOPT) 22.3-6.8  MG/ML ophthalmic solution   1  ? Glucosamine-Chondroit-Vit C-Mn (GLUCOSAMINE 1500 COMPLEX) CAPS Take by mouth.    ? hydrochlorothiazide (HYDRODIURIL) 12.5 MG tablet TAKE 1 TABLET BY MOUTH  DAILY 90 tablet 3  ? LUMIGAN 0.01 % SOLN     ? montelukast (SINGULAIR) 10 MG tablet Take 1 tablet (10 mg total) by mouth at bedtime. 30 tablet 5  ? Multiple Vitamins-Minerals (MULTIVITAMIN ADULT PO) Take by mouth.    ? omeprazole (PRILOSEC) 20 MG capsule Take 1 capsule (20 mg total) by mouth daily. 30 capsule 3   ? RHOPRESSA 0.02 % SOLN     ? simvastatin (ZOCOR) 20 MG tablet TAKE 1 TABLET BY MOUTH AT  BEDTIME 90 tablet 3  ? tretinoin (RETIN-A) 0.05 % cream Apply topically at bedtime.    ? valACYclovir (VALTREX) 1000 MG tablet SMARTSIG:2 Tablet(s) By Mouth    ? vitamin E 400 UNIT capsule Take 400 Units by mouth daily.    ? zolpidem (AMBIEN CR) 6.25 MG CR tablet Take 1 tablet (6.25 mg total) by mouth at bedtime as needed for sleep. 30 tablet 1  ? ?No facility-administered medications prior to visit.  ? ? ? ?Review of Systems:  ? ?Constitutional:   No  weight loss, night sweats,  Fevers, chills, fatigue, or  lassitude. ? ?HEENT:   No headaches,  Difficulty swallowing,  Tooth/dental problems, or  Sore throat,  ?              No sneezing, itching, ear ache,  ?+nasal congestion, post nasal drip,  ? ?CV:  No chest pain,  Orthopnea, PND, swelling in lower extremities, anasarca, dizziness, palpitations, syncope.  ? ?GI  No heartburn, indigestion, abdominal pain, nausea, vomiting, diarrhea, change in bowel habits, loss of appetite, bloody stools.  ? ?Resp: No shortness of breath with exertion or at rest.  No excess mucus, no productive cough,  No non-productive cough,  No coughing up of blood.  No change in color of mucus.  No wheezing.  No chest wall deformity ? ?Skin: no rash or lesions. ? ?GU: no dysuria, change in color of urine, no urgency or frequency.  No flank pain, no hematuria  ? ?MS:  No joint pain or swelling.  No decreased range of motion.  No back pain. ? ? ? ?Physical Exam ? ?BP 132/70 (BP Location: Left Arm, Patient Position: Sitting, Cuff Size: Normal)   Pulse 78   Temp 98.1 ?F (36.7 ?C) (Oral)   Ht $R'5\' 2"'hi$  (1.575 m)   Wt 178 lb (80.7 kg)   SpO2 98%   BMI 32.56 kg/m?  ? ?GEN: A/Ox3; pleasant , NAD, well nourished  ?  ?HEENT:  [place]/AT,  , NOSE-clear, THROAT-clear, no lesions, no postnasal drip or exudate noted.  ? ?NECK:  Supple w/ fair ROM; no JVD; normal carotid impulses w/o bruits; no thyromegaly or nodules  palpated; no lymphadenopathy.   ? ?RESP  Clear  P & A; w/o, wheezes/ rales/ or rhonchi. no accessory muscle use, no dullness to percussion ? ?CARD:  RRR, no m/r/g, no peripheral edema, pulses intact, no cyanosis or clubbing. ? ?GI:   Soft & nt; nml bowel sounds; no organomegaly or masses detected.  ? ?Musco: Warm bil, no deformities or joint swelling noted.  ? ?Neuro: alert, no focal deficits noted.   ? ?Skin: Warm, no lesions or rashes ? ? ? ?Lab Results: ? ?CBC ?   ?Component Value Date/Time  ? WBC 7.1 10/02/2021 1701  ? RBC 4.37  10/02/2021 1701  ? HGB 12.8 10/02/2021 1701  ? HCT 38.9 10/02/2021 1701  ? PLT 305.0 10/02/2021 1701  ? MCV 89.1 10/02/2021 1701  ? MCHC 33.0 10/02/2021 1701  ? RDW 13.5 10/02/2021 1701  ? LYMPHSABS 2.0 10/02/2021 1701  ? MONOABS 0.5 10/02/2021 1701  ? EOSABS 0.4 10/02/2021 1701  ? BASOSABS 0.1 10/02/2021 1701  ? ? ?BMET ?   ?Component Value Date/Time  ? NA 141 06/18/2021 1416  ? K 3.5 06/18/2021 1416  ? CL 106 06/18/2021 1416  ? CO2 27 06/18/2021 1416  ? GLUCOSE 94 06/18/2021 1416  ? BUN 20 06/18/2021 1416  ? CREATININE 0.90 06/18/2021 1416  ? CREATININE 0.88 05/08/2011 1418  ? CALCIUM 9.8 06/18/2021 1416  ? GFRNONAA 92.98 03/26/2010 1049  ? GFRAA  09/15/2009 2344  ?  >60        ?The eGFR has been calculated ?using the MDRD equation. ?This calculation has not been ?validated in all clinical ?situations. ?eGFR's persistently ?<60 mL/min signify ?possible Chronic Kidney Disease.  ? ? ?BNP ?No results found for: BNP ? ?ProBNP ?No results found for: PROBNP ? ?Imaging: ?MM DIAG BREAST TOMO UNI LEFT ? ?Result Date: 10/08/2021 ?CLINICAL DATA:  Nonfocal left breast pain EXAM: DIGITAL DIAGNOSTIC UNILATERAL LEFT MAMMOGRAM WITH TOMOSYNTHESIS AND CAD TECHNIQUE: Left digital diagnostic mammography and breast tomosynthesis was performed. The images were evaluated with computer-aided detection. COMPARISON:  Previous exam(s). ACR Breast Density Category b: There are scattered areas of fibroglandular  density. FINDINGS: No suspicious masses, calcifications, or distortion are identified in the left breast. IMPRESSION: No mammographic evidence of malignancy. RECOMMENDATION: Treatment of the patient's pain should be ba

## 2021-11-04 NOTE — Progress Notes (Signed)
PFT done today. 

## 2021-11-05 NOTE — Progress Notes (Signed)
Reviewed and agree with assessment/plan. ? ? ?Chesley Mires, MD ?[place] ?11/05/2021, 8:22 AM ?Pager:  [phone] ? ?

## 2021-11-11 DIAGNOSIS — H43813 Vitreous degeneration, bilateral: Secondary | ICD-10-CM | POA: Diagnosis not present

## 2021-11-11 DIAGNOSIS — H401122 Primary open-angle glaucoma, left eye, moderate stage: Secondary | ICD-10-CM | POA: Diagnosis not present

## 2021-11-11 DIAGNOSIS — Z961 Presence of intraocular lens: Secondary | ICD-10-CM | POA: Diagnosis not present

## 2021-11-11 DIAGNOSIS — H401113 Primary open-angle glaucoma, right eye, severe stage: Secondary | ICD-10-CM | POA: Diagnosis not present

## 2021-11-19 ENCOUNTER — Telehealth: Payer: Medicare Other

## 2021-12-02 ENCOUNTER — Ambulatory Visit
Admission: RE | Admit: 2021-12-02 | Discharge: 2021-12-02 | Disposition: A | Discharge status: Home / Self Care | Payer: Medicare Other | Source: Ambulatory Visit | Attending: Family Medicine | Admitting: Family Medicine

## 2021-12-02 DIAGNOSIS — Z780 Asymptomatic menopausal state: Secondary | ICD-10-CM | POA: Diagnosis not present

## 2021-12-02 DIAGNOSIS — M8589 Other specified disorders of bone density and structure, multiple sites: Secondary | ICD-10-CM | POA: Diagnosis not present

## 2021-12-02 DIAGNOSIS — M8580 Other specified disorders of bone density and structure, unspecified site: Secondary | ICD-10-CM

## 2021-12-03 ENCOUNTER — Encounter: Payer: Self-pay | Admitting: Family Medicine

## 2021-12-09 ENCOUNTER — Telehealth: Payer: Self-pay | Admitting: Family Medicine

## 2021-12-09 NOTE — Telephone Encounter (Signed)
Left message for patient to call back and schedule Medicare Annual Wellness Visit (AWV) .  ? ?Please offer to do virtually or by telephone.  Left office number and my jabber [phone]. ? ?Last AWV:05/13/2020 ? ?Please schedule at anytime with Nurse Health Advisor. ?  ?

## 2021-12-11 ENCOUNTER — Ambulatory Visit (INDEPENDENT_AMBULATORY_CARE_PROVIDER_SITE_OTHER): Payer: Medicare Other

## 2021-12-11 DIAGNOSIS — D2262 Melanocytic nevi of left upper limb, including shoulder: Secondary | ICD-10-CM | POA: Diagnosis not present

## 2021-12-11 DIAGNOSIS — L570 Actinic keratosis: Secondary | ICD-10-CM | POA: Diagnosis not present

## 2021-12-11 DIAGNOSIS — D225 Melanocytic nevi of trunk: Secondary | ICD-10-CM | POA: Diagnosis not present

## 2021-12-11 DIAGNOSIS — D485 Neoplasm of uncertain behavior of skin: Secondary | ICD-10-CM | POA: Diagnosis not present

## 2021-12-11 DIAGNOSIS — Z0000 Encounter for general adult medical examination without abnormal findings: Secondary | ICD-10-CM | POA: Diagnosis not present

## 2021-12-11 DIAGNOSIS — L578 Other skin changes due to chronic exposure to nonionizing radiation: Secondary | ICD-10-CM | POA: Diagnosis not present

## 2021-12-11 DIAGNOSIS — L814 Other melanin hyperpigmentation: Secondary | ICD-10-CM | POA: Diagnosis not present

## 2021-12-11 NOTE — Progress Notes (Signed)
? ?Subjective:  ? Alexandra [name] is a 71 y.o. female who presents for Medicare Annual (Subsequent) preventive examination. ? ? ?I connected with Alexandra [name]  today by telephone and verified that I am speaking with the correct person using two identifiers. ?Location patient: home ?Location provider: work ?Persons participating in the virtual visit: patient, provider. ?  ?I discussed the limitations, risks, security and privacy concerns of performing an evaluation and management service by telephone and the availability of in person appointments. I also discussed with the patient that there may be a patient responsible charge related to this service. The patient expressed understanding and verbally consented to this telephonic visit.  ?  ?Interactive audio and video telecommunications were attempted between this provider and patient, however failed, due to patient having technical difficulties OR patient did not have access to video capability.  We continued and completed visit with audio only. ? ?  ?Review of Systems    ? ?Cardiac Risk Factors include: advanced age (>52mn, >>6women) ? ?   ?Objective:  ?  ?Today's Vitals  ? ?There is no height or weight on file to calculate BMI. ? ? ?  12/11/2021  ? 11:47 AM 05/13/2020  ? 11:18 AM 12/24/2017  ?  9:36 AM  ?Advanced Directives  ?Does Patient Have a Medical Advance Directive? Yes No No  ?Type of [place]    ?[place]? No - copy requested    ?Would patient like information on creating a medical advance directive?  Yes (MAU/Ambulatory/Procedural Areas - Information given) Yes (MAU/Ambulatory/Procedural Areas - Information given)  ? ? ?Current Medications (verified) ?Outpatient Encounter Medications as of 12/11/2021  ?Medication Sig  ? acetaZOLAMIDE (DIAMOX) 500 MG capsule Take 500 mg by mouth 2 (two) times daily.  ? albuterol (VENTOLIN HFA) 108 (90 Base) MCG/ACT inhaler Inhale 2 puffs into the  lungs every 6 (six) hours as needed for wheezing or shortness of breath.  ? Ascorbic Acid (VITAMIN C) 1000 MG tablet Take 1,000 mg by mouth daily.  ? B Complex-C (SUPER B COMPLEX PO) Take by mouth.  ? brimonidine (ALPHAGAN) 0.2 % ophthalmic solution   ? Calcium Carbonate-Vit D-Min (CALCIUM 1200 PO) Take by mouth.  ? cetirizine (ZYRTEC) 10 MG tablet Take 1 tablet (10 mg total) by mouth daily.  ? dorzolamide-timolol (COSOPT) 22.3-6.8 MG/ML ophthalmic solution   ? Glucosamine-Chondroit-Vit C-Mn (GLUCOSAMINE 1500 COMPLEX) CAPS Take by mouth.  ? hydrochlorothiazide (HYDRODIURIL) 12.5 MG tablet TAKE 1 TABLET BY MOUTH  DAILY  ? LUMIGAN 0.01 % SOLN   ? montelukast (SINGULAIR) 10 MG tablet Take 1 tablet (10 mg total) by mouth at bedtime.  ? Multiple Vitamins-Minerals (MULTIVITAMIN ADULT PO) Take by mouth.  ? omeprazole (PRILOSEC) 20 MG capsule Take 1 capsule (20 mg total) by mouth daily.  ? simvastatin (ZOCOR) 20 MG tablet TAKE 1 TABLET BY MOUTH AT  BEDTIME  ? tretinoin (RETIN-A) 0.05 % cream Apply topically at bedtime.  ? valACYclovir (VALTREX) 1000 MG tablet SMARTSIG:2 Tablet(s) By Mouth  ? vitamin E 400 UNIT capsule Take 400 Units by mouth daily.  ? zolpidem (AMBIEN CR) 6.25 MG CR tablet Take 1 tablet (6.25 mg total) by mouth at bedtime as needed for sleep.  ? benzonatate (TESSALON) 200 MG capsule Take 1 capsule (200 mg total) by mouth 3 (three) times daily as needed for cough.  ? RHOPRESSA 0.02 % SOLN   ? ?No facility-administered encounter medications on file as  of 12/11/2021.  ? ? ?Allergies (verified) ?Patient has no known allergies.  ? ?History: ?Past Medical History:  ?Diagnosis Date  ? Cataract   ? REMOVED BOTH - LENS IMPLANTS  ? Depression   ? Family history of ovarian cancer   ? Glaucoma   ? Hyperlipidemia   ? Hypertension   ? OSA (obstructive sleep apnea) 10/05/2017  ? Osteopenia   ? Sleep apnea   ? WEARS CPAP  ? ?Past Surgical History:  ?Procedure Laterality Date  ? CATARACT EXTRACTION    ? 11/04/17  ? COLONOSCOPY     ? DERMOID CYST  EXCISION    ? ELBOW FRACTURE SURGERY  2004  ? ?Family History  ?Problem Relation Age of Onset  ? Heart attack Father   ? Lung cancer Sister 45  ?     smoker  ? Throat cancer Brother 59  ?     smoker  ? Ovarian cancer Mother 50  ? Esophageal cancer Maternal Uncle   ? Lung cancer Maternal Grandfather   ?     dx in his 26s  ? Hodgkin's lymphoma Other 16  ? Colon cancer Neg Hx   ? Colon polyps Neg Hx   ? Rectal cancer Neg Hx   ? Stomach cancer Neg Hx   ? ?Social History  ? ?Socioeconomic History  ? Marital status: Married  ?  Spouse name: Not on file  ? Number of children: 2  ? Years of education: Not on file  ? Highest education level: Not on file  ?Occupational History  ? Occupation: retired  ?Tobacco Use  ? Smoking status: Never  ? Smokeless tobacco: Never  ?Vaping Use  ? Vaping Use: Never used  ?Substance and Sexual Activity  ? Alcohol use: Yes  ?  Alcohol/week: 2.0 standard drinks  ?  Types: 2 Glasses of wine per week  ? Drug use: No  ? Sexual activity: Not on file  ?Other Topics Concern  ? Not on file  ?Social History Narrative  ? Not on file  ? ?Social Determinants of Health  ? ?Financial Resource Strain: Low Risk   ? Difficulty of Paying Living Expenses: Not hard at all  ?Food Insecurity: No Food Insecurity  ? Worried About Charity fundraiser in the Last Year: Never true  ? Ran Out of Food in the Last Year: Never true  ?Transportation Needs: No Transportation Needs  ? Lack of Transportation (Medical): No  ? Lack of Transportation (Non-Medical): No  ?Physical Activity: Sufficiently Active  ? Days of Exercise per Week: 7 days  ? Minutes of Exercise per Session: 60 min  ?Stress: No Stress Concern Present  ? Feeling of Stress : Not at all  ?Social Connections: Socially Integrated  ? Frequency of Communication with Friends and Family: Three times a week  ? Frequency of Social Gatherings with Friends and Family: Three times a week  ? Attends Religious Services: More than 4 times per year  ? Active  Member of Clubs or Organizations: Yes  ? Attends Archivist Meetings: More than 4 times per year  ? Marital Status: Married  ? ? ?Tobacco Counseling ?Counseling given: Not Answered ? ? ?Clinical Intake: ? ?Pre-visit preparation completed: Yes ? ?Pain : No/denies pain ? ?  ? ?Nutritional Risks: None ?Diabetes: No ? ?How often do you need to have someone help you when you read instructions, pamphlets, or other written materials from your doctor or pharmacy?: 1 - Never ?What is the last  grade level you completed in school?: college ? ?Diabetic?no  ? ?Interpreter Needed?: No ? ?Information entered by :: [email or web address] ? ? ?Activities of Daily Living ? ?  12/11/2021  ? 11:55 AM 06/18/2021  ?  1:34 PM  ?In your present state of health, do you have any difficulty performing the following activities:  ?Hearing? 0 0  ?Vision? 0 0  ?Difficulty concentrating or making decisions? 0 0  ?Walking or climbing stairs? 0 0  ?Dressing or bathing? 0 0  ?Doing errands, shopping? 0 0  ?Preparing Food and eating ? N   ?Using the Toilet? N   ?In the past six months, have you accidently leaked urine? N   ?Do you have problems with loss of bowel control? N   ?Managing your Medications? N   ?Managing your Finances? N   ?Housekeeping or managing your Housekeeping? N   ? ? ?Patient Care Team: ?[name], MD as PCP - General ?[name], [facility] as Pharmacist (Pharmacist) ? ?Indicate any recent Medical Services you may have received from other than [facility] providers in the past year (date may be approximate). ? ?   ?Assessment:  ? This is a routine wellness examination for [facility]. ? ?Hearing/Vision screen ?Vision Screening - Comments:: Annual eye exams wear glasses  ? ?Dietary issues and exercise activities discussed: ?Current Exercise Habits: Home exercise routine, Type of exercise: walking, Time (Minutes): 60, Frequency (Times/Week): 5, Weekly Exercise (Minutes/Week): 300, Intensity: Mild, Exercise limited by: None identified ? ? Goals  Addressed   ? ?  ?  ?  ?  ? This Visit's Progress  ?  Patient Stated   On track  ?  Maintain current level of activity ?  ? ?  ? ?Depression Screen ? ?  12/11/2021  ? 11:48 AM 12/11/2021  ? 11:46 AM 2/7

## 2021-12-11 NOTE — Patient Instructions (Signed)
Alexandra [name] , ?Thank you for taking time to come for your Medicare Wellness Visit. I appreciate your ongoing commitment to your health goals. Please review the following plan we discussed and let me know if I can assist you in the future.  ? ?Screening recommendations/referrals: ?Colonoscopy: 07/14/2019  due 2027 ?Mammogram: 01/10/2021 ?Bone Density: 12/02/2021 ?Recommended yearly ophthalmology/optometry visit for glaucoma screening and checkup ?Recommended yearly dental visit for hygiene and checkup ? ?Vaccinations: ?Influenza vaccine: completed  ?Pneumococcal vaccine: completed  ?Tdap vaccine: 12/15/2016 ?Shingles vaccine: completed    ? ?Advanced directives: yes  ? ?Conditions/risks identified: none  ? ?Next appointment: none  ? ? ?Preventive Care 71 Years and Older, Female ?Preventive care refers to lifestyle choices and visits with your health care provider that can promote health and wellness. ?What does preventive care include? ?A yearly physical exam. This is also called an annual well check. ?Dental exams once or twice a year. ?Routine eye exams. Ask your health care provider how often you should have your eyes checked. ?Personal lifestyle choices, including: ?Daily care of your teeth and gums. ?Regular physical activity. ?Eating a healthy diet. ?Avoiding tobacco and drug use. ?Limiting alcohol use. ?Practicing safe sex. ?Taking low-dose aspirin every day. ?Taking vitamin and mineral supplements as recommended by your health care provider. ?What happens during an annual well check? ?The services and screenings done by your health care provider during your annual well check will depend on your age, overall health, lifestyle risk factors, and family history of disease. ?Counseling  ?Your health care provider may ask you questions about your: ?Alcohol use. ?Tobacco use. ?Drug use. ?Emotional well-being. ?Home and relationship well-being. ?Sexual activity. ?Eating habits. ?History of falls. ?Memory and ability to  understand (cognition). ?Work and work Statistician. ?Reproductive health. ?Screening  ?You may have the following tests or measurements: ?Height, weight, and BMI. ?Blood pressure. ?Lipid and cholesterol levels. These may be checked every 5 years, or more frequently if you are over 75 years old. ?Skin check. ?Lung cancer screening. You may have this screening every year starting at age 50 if you have a 30-pack-year history of smoking and currently smoke or have quit within the past 15 years. ?Fecal occult blood test (FOBT) of the stool. You may have this test every year starting at age 73. ?Flexible sigmoidoscopy or colonoscopy. You may have a sigmoidoscopy every 5 years or a colonoscopy every 10 years starting at age 71. ?Hepatitis C blood test. ?Hepatitis B blood test. ?Sexually transmitted disease (STD) testing. ?Diabetes screening. This is done by checking your blood sugar (glucose) after you have not eaten for a while (fasting). You may have this done every 1-3 years. ?Bone density scan. This is done to screen for osteoporosis. You may have this done starting at age 68. ?Mammogram. This may be done every 1-2 years. Talk to your health care provider about how often you should have regular mammograms. ?Talk with your health care provider about your test results, treatment options, and if necessary, the need for more tests. ?Vaccines  ?Your health care provider may recommend certain vaccines, such as: ?Influenza vaccine. This is recommended every year. ?Tetanus, diphtheria, and acellular pertussis (Tdap, Td) vaccine. You may need a Td booster every 10 years. ?Zoster vaccine. You may need this after age 77. ?Pneumococcal 13-valent conjugate (PCV13) vaccine. One dose is recommended after age 30. ?Pneumococcal polysaccharide (PPSV23) vaccine. One dose is recommended after age 71. ?Talk to your health care provider about which screenings and vaccines you need and  how often you need them. ?This information is not  intended to replace advice given to you by your health care provider. Make sure you discuss any questions you have with your health care provider. ?Document Released: 08/23/2015 Document Revised: 04/15/2016 Document Reviewed: 05/28/2015 ?Elsevier Interactive Patient Education ? 2017 [place]. ? ?Fall Prevention in the Home ?Falls can cause injuries. They can happen to people of all ages. There are many things you can do to make your home safe and to help prevent falls. ?What can I do on the outside of my home? ?Regularly fix the edges of walkways and driveways and fix any cracks. ?Remove anything that might make you trip as you walk through a door, such as a raised step or threshold. ?Trim any bushes or trees on the path to your home. ?Use bright outdoor lighting. ?Clear any walking paths of anything that might make someone trip, such as rocks or tools. ?Regularly check to see if handrails are loose or broken. Make sure that both sides of any steps have handrails. ?Any raised decks and porches should have guardrails on the edges. ?Have any leaves, snow, or ice cleared regularly. ?Use sand or salt on walking paths during winter. ?Clean up any spills in your garage right away. This includes oil or grease spills. ?What can I do in the bathroom? ?Use night lights. ?Install grab bars by the toilet and in the tub and shower. Do not use towel bars as grab bars. ?Use non-skid mats or decals in the tub or shower. ?If you need to sit down in the shower, use a plastic, non-slip stool. ?Keep the floor dry. Clean up any water that spills on the floor as soon as it happens. ?Remove soap buildup in the tub or shower regularly. ?Attach bath mats securely with double-sided non-slip rug tape. ?Do not have throw rugs and other things on the floor that can make you trip. ?What can I do in the bedroom? ?Use night lights. ?Make sure that you have a light by your bed that is easy to reach. ?Do not use any sheets or blankets that are  too big for your bed. They should not hang down onto the floor. ?Have a firm chair that has side arms. You can use this for support while you get dressed. ?Do not have throw rugs and other things on the floor that can make you trip. ?What can I do in the kitchen? ?Clean up any spills right away. ?Avoid walking on wet floors. ?Keep items that you use a lot in easy-to-reach places. ?If you need to reach something above you, use a strong step stool that has a grab bar. ?Keep electrical cords out of the way. ?Do not use floor polish or wax that makes floors slippery. If you must use wax, use non-skid floor wax. ?Do not have throw rugs and other things on the floor that can make you trip. ?What can I do with my stairs? ?Do not leave any items on the stairs. ?Make sure that there are handrails on both sides of the stairs and use them. Fix handrails that are broken or loose. Make sure that handrails are as long as the stairways. ?Check any carpeting to make sure that it is firmly attached to the stairs. Fix any carpet that is loose or worn. ?Avoid having throw rugs at the top or bottom of the stairs. If you do have throw rugs, attach them to the floor with carpet tape. ?Make sure that you have  a light switch at the top of the stairs and the bottom of the stairs. If you do not have them, ask someone to add them for you. ?What else can I do to help prevent falls? ?Wear shoes that: ?Do not have high heels. ?Have rubber bottoms. ?Are comfortable and fit you well. ?Are closed at the toe. Do not wear sandals. ?If you use a stepladder: ?Make sure that it is fully opened. Do not climb a closed stepladder. ?Make sure that both sides of the stepladder are locked into place. ?Ask someone to hold it for you, if possible. ?Clearly mark and make sure that you can see: ?Any grab bars or handrails. ?First and last steps. ?Where the edge of each step is. ?Use tools that help you move around (mobility aids) if they are needed. These  include: ?Canes. ?Walkers. ?Scooters. ?Crutches. ?Turn on the lights when you go into a dark area. Replace any light bulbs as soon as they burn out. ?Set up your furniture so you have a clear path. Avoid moving you

## 2021-12-16 ENCOUNTER — Ambulatory Visit: Payer: Medicare Other | Admitting: Family Medicine

## 2021-12-16 ENCOUNTER — Encounter: Payer: Self-pay | Admitting: Family Medicine

## 2021-12-16 VITALS — BP 118/60 | HR 81 | Temp 97.4°F | Resp 16 | Ht 62.0 in | Wt 183.2 lb

## 2021-12-16 DIAGNOSIS — E663 Overweight: Secondary | ICD-10-CM

## 2021-12-16 DIAGNOSIS — I10 Essential (primary) hypertension: Secondary | ICD-10-CM

## 2021-12-16 DIAGNOSIS — E785 Hyperlipidemia, unspecified: Secondary | ICD-10-CM

## 2021-12-16 DIAGNOSIS — E669 Obesity, unspecified: Secondary | ICD-10-CM

## 2021-12-16 LAB — CBC WITH DIFFERENTIAL/PLATELET
Basophils Absolute: 0 10*3/uL (ref 0.0–0.1)
Basophils Relative: 0.8 % (ref 0.0–3.0)
Eosinophils Absolute: 0.1 10*3/uL (ref 0.0–0.7)
Eosinophils Relative: 2.4 % (ref 0.0–5.0)
HCT: 40.6 % (ref 36.0–46.0)
Hemoglobin: 13.1 g/dL (ref 12.0–15.0)
Lymphocytes Relative: 31.7 % (ref 12.0–46.0)
Lymphs Abs: 1.4 10*3/uL (ref 0.7–4.0)
MCHC: 32.3 g/dL (ref 30.0–36.0)
MCV: 85.7 fl (ref 78.0–100.0)
Monocytes Absolute: 0.4 10*3/uL (ref 0.1–1.0)
Monocytes Relative: 8.3 % (ref 3.0–12.0)
Neutro Abs: 2.5 10*3/uL (ref 1.4–7.7)
Neutrophils Relative %: 56.8 % (ref 43.0–77.0)
Platelets: 237 10*3/uL (ref 150.0–400.0)
RBC: 4.73 Mil/uL (ref 3.87–5.11)
RDW: 14 % (ref 11.5–15.5)
WBC: 4.3 10*3/uL (ref 4.0–10.5)

## 2021-12-16 LAB — BASIC METABOLIC PANEL
BUN: 18 mg/dL (ref 6–23)
CO2: 25 mEq/L (ref 19–32)
Calcium: 9.5 mg/dL (ref 8.4–10.5)
Chloride: 107 mEq/L (ref 96–112)
Creatinine, Ser: 0.93 mg/dL (ref 0.40–1.20)
GFR: 62.75 mL/min (ref >60.00)
Glucose, Bld: 94 mg/dL (ref 70–99)
Potassium: 3.7 mEq/L (ref 3.5–5.1)
Sodium: 140 mEq/L (ref 135–145)

## 2021-12-16 LAB — LIPID PANEL
Cholesterol: 183 mg/dL (ref 0–200)
HDL: 71.5 mg/dL (ref >39.00)
LDL Cholesterol: 73 mg/dL (ref 0–99)
NonHDL: 111.12
Total CHOL/HDL Ratio: 3
Triglycerides: 189 mg/dL — ABNORMAL HIGH (ref 0.0–149.0)
VLDL: 37.8 mg/dL (ref 0.0–40.0)

## 2021-12-16 LAB — HEPATIC FUNCTION PANEL
ALT: 14 U/L (ref 0–35)
AST: 17 U/L (ref 0–37)
Albumin: 4.1 g/dL (ref 3.5–5.2)
Alkaline Phosphatase: 53 U/L (ref 39–117)
Bilirubin, Direct: 0.2 mg/dL (ref 0.0–0.3)
Total Bilirubin: 1.3 mg/dL — ABNORMAL HIGH (ref 0.2–1.2)
Total Protein: 6.7 g/dL (ref 6.0–8.3)

## 2021-12-16 LAB — TSH: TSH: 2.18 u[IU]/mL (ref 0.35–5.50)

## 2021-12-16 NOTE — Patient Instructions (Signed)
Schedule your complete physical in 6 months ?We'll notify you of your lab results and make any changes if needed ?Keep up the good work on healthy diet and regular exercise- you're doing great! ?Call with any questions or concerns ?Stay Safe!  Stay Healthy! ?Happy Mother's Day!!! ?

## 2021-12-16 NOTE — Progress Notes (Signed)
? ?  Subjective:  ? ? Patient ID: Alexandra [name], female    DOB: 08/13/1950, 71 y.o.   MRN: [id number] ? ?HPI ?HTN- chronic problem, on HCTZ 12.'5mg'$  daily w/ good control.  Pt denies CP, SOB, HAs, visual changes, edema. ? ?Hyperlipidemia- chronic problem, on Simvastatin '20mg'$  daily.  No abd pain, N/V. ? ?Obesity- pt has gained 5 lbs since last visit.  Pt is exercising regularly- 7 days/week. ? ? ?Review of Systems ?For ROS see HPI  ?   ?Objective:  ? Physical Exam ?Vitals reviewed.  ?Constitutional:   ?   General: She is not in acute distress. ?   Appearance: Normal appearance. She is well-developed. She is obese. She is not ill-appearing.  ?HENT:  ?   Head: Normocephalic and atraumatic.  ?Eyes:  ?   Conjunctiva/sclera: Conjunctivae normal.  ?   Pupils: Pupils are equal, round, and reactive to light.  ?Neck:  ?   Thyroid: No thyromegaly.  ?Cardiovascular:  ?   Rate and Rhythm: Normal rate and regular rhythm.  ?   Pulses: Normal pulses.  ?   Heart sounds: Normal heart sounds. No murmur heard. ?Pulmonary:  ?   Effort: Pulmonary effort is normal. No respiratory distress.  ?   Breath sounds: Normal breath sounds.  ?Abdominal:  ?   General: There is no distension.  ?   Palpations: Abdomen is soft.  ?   Tenderness: There is no abdominal tenderness.  ?Musculoskeletal:  ?   Cervical back: Normal range of motion and neck supple.  ?   Right lower leg: No edema.  ?   Left lower leg: No edema.  ?Lymphadenopathy:  ?   Cervical: No cervical adenopathy.  ?Skin: ?   General: Skin is warm and dry.  ?Neurological:  ?   Mental Status: She is alert and oriented to person, place, and time.  ?Psychiatric:     ?   Behavior: Behavior normal.  ? ? ? ? ? ?   ?Assessment & Plan:  ? ? ?

## 2021-12-17 ENCOUNTER — Telehealth: Payer: Self-pay

## 2021-12-17 NOTE — Assessment & Plan Note (Signed)
Chronic problem.  On Simvastatin '20mg'$  daily.  Currently asymptomatic.  Check labs.  Adjust meds prn  ?

## 2021-12-17 NOTE — Telephone Encounter (Signed)
-----   Message from [name], MD sent at 12/17/2021  7:16 AM EDT ----- ?Labs look great!  No changes at this time ?

## 2021-12-17 NOTE — Telephone Encounter (Signed)
Spoke w/ pt and advised of lab results  

## 2021-12-17 NOTE — Assessment & Plan Note (Signed)
Pt has gained 5 lbs since last visit.  BMI now 33.51  She is exercising daily- applauded her efforts.  Encouraged low carb diet.  Will follow. ?

## 2021-12-17 NOTE — Assessment & Plan Note (Signed)
Chronic problem.  Currently well controlled on HCTZ 12.'5mg'$  daily.  Asymptomatic.  Check labs due to diuretic use.  No anticipated med changes.  Will follow. ?

## 2022-01-14 ENCOUNTER — Other Ambulatory Visit: Payer: Self-pay | Admitting: Family Medicine

## 2022-01-17 ENCOUNTER — Other Ambulatory Visit: Payer: Self-pay | Admitting: Family Medicine

## 2022-01-21 ENCOUNTER — Ambulatory Visit
Admission: RE | Admit: 2022-01-21 | Discharge: 2022-01-21 | Disposition: A | Discharge status: Home / Self Care | Payer: Medicare Other | Source: Ambulatory Visit | Attending: Family Medicine | Admitting: Family Medicine

## 2022-01-21 DIAGNOSIS — Z1231 Encounter for screening mammogram for malignant neoplasm of breast: Secondary | ICD-10-CM

## 2022-01-28 DIAGNOSIS — G4733 Obstructive sleep apnea (adult) (pediatric): Secondary | ICD-10-CM | POA: Diagnosis not present

## 2022-02-16 ENCOUNTER — Other Ambulatory Visit: Payer: Self-pay | Admitting: Family Medicine

## 2022-02-17 DIAGNOSIS — D485 Neoplasm of uncertain behavior of skin: Secondary | ICD-10-CM | POA: Diagnosis not present

## 2022-02-17 DIAGNOSIS — L988 Other specified disorders of the skin and subcutaneous tissue: Secondary | ICD-10-CM | POA: Diagnosis not present

## 2022-03-17 DIAGNOSIS — H401122 Primary open-angle glaucoma, left eye, moderate stage: Secondary | ICD-10-CM | POA: Diagnosis not present

## 2022-03-17 DIAGNOSIS — H53032 Strabismic amblyopia, left eye: Secondary | ICD-10-CM | POA: Diagnosis not present

## 2022-03-17 DIAGNOSIS — H43813 Vitreous degeneration, bilateral: Secondary | ICD-10-CM | POA: Diagnosis not present

## 2022-03-17 DIAGNOSIS — H401113 Primary open-angle glaucoma, right eye, severe stage: Secondary | ICD-10-CM | POA: Diagnosis not present

## 2022-03-17 DIAGNOSIS — Z961 Presence of intraocular lens: Secondary | ICD-10-CM | POA: Diagnosis not present

## 2022-03-19 DIAGNOSIS — L570 Actinic keratosis: Secondary | ICD-10-CM | POA: Diagnosis not present

## 2022-05-12 ENCOUNTER — Other Ambulatory Visit: Payer: Self-pay | Admitting: Family Medicine

## 2022-06-17 DIAGNOSIS — L578 Other skin changes due to chronic exposure to nonionizing radiation: Secondary | ICD-10-CM | POA: Diagnosis not present

## 2022-06-17 DIAGNOSIS — L821 Other seborrheic keratosis: Secondary | ICD-10-CM | POA: Diagnosis not present

## 2022-06-17 DIAGNOSIS — L570 Actinic keratosis: Secondary | ICD-10-CM | POA: Diagnosis not present

## 2022-06-17 DIAGNOSIS — D225 Melanocytic nevi of trunk: Secondary | ICD-10-CM | POA: Diagnosis not present

## 2022-06-17 DIAGNOSIS — L814 Other melanin hyperpigmentation: Secondary | ICD-10-CM | POA: Diagnosis not present

## 2022-06-22 ENCOUNTER — Ambulatory Visit (INDEPENDENT_AMBULATORY_CARE_PROVIDER_SITE_OTHER): Payer: Medicare Other | Admitting: Family Medicine

## 2022-06-22 ENCOUNTER — Encounter: Payer: Self-pay | Admitting: Family Medicine

## 2022-06-22 VITALS — BP 122/70 | HR 65 | Temp 97.3°F | Resp 17 | Ht 62.0 in | Wt 178.4 lb

## 2022-06-22 DIAGNOSIS — Z0000 Encounter for general adult medical examination without abnormal findings: Secondary | ICD-10-CM

## 2022-06-22 DIAGNOSIS — I10 Essential (primary) hypertension: Secondary | ICD-10-CM | POA: Diagnosis not present

## 2022-06-22 DIAGNOSIS — M8580 Other specified disorders of bone density and structure, unspecified site: Secondary | ICD-10-CM

## 2022-06-22 LAB — HEPATIC FUNCTION PANEL
ALT: 16 U/L (ref 0–35)
AST: 21 U/L (ref 0–37)
Albumin: 4.2 g/dL (ref 3.5–5.2)
Alkaline Phosphatase: 41 U/L (ref 39–117)
Bilirubin, Direct: 0.2 mg/dL (ref 0.0–0.3)
Total Bilirubin: 1.2 mg/dL (ref 0.2–1.2)
Total Protein: 6.8 g/dL (ref 6.0–8.3)

## 2022-06-22 LAB — CBC WITH DIFFERENTIAL/PLATELET
Basophils Absolute: 0 10*3/uL (ref 0.0–0.1)
Basophils Relative: 0.7 % (ref 0.0–3.0)
Eosinophils Absolute: 0.2 10*3/uL (ref 0.0–0.7)
Eosinophils Relative: 3.9 % (ref 0.0–5.0)
HCT: 42.4 % (ref 36.0–46.0)
Hemoglobin: 14 g/dL (ref 12.0–15.0)
Lymphocytes Relative: 31.8 % (ref 12.0–46.0)
Lymphs Abs: 1.6 10*3/uL (ref 0.7–4.0)
MCHC: 33 g/dL (ref 30.0–36.0)
MCV: 88.4 fl (ref 78.0–100.0)
Monocytes Absolute: 0.4 10*3/uL (ref 0.1–1.0)
Monocytes Relative: 9.1 % (ref 3.0–12.0)
Neutro Abs: 2.7 10*3/uL (ref 1.4–7.7)
Neutrophils Relative %: 54.5 % (ref 43.0–77.0)
Platelets: 233 10*3/uL (ref 150.0–400.0)
RBC: 4.79 Mil/uL (ref 3.87–5.11)
RDW: 14 % (ref 11.5–15.5)
WBC: 4.9 10*3/uL (ref 4.0–10.5)

## 2022-06-22 LAB — LIPID PANEL
Cholesterol: 160 mg/dL (ref 0–200)
HDL: 69.8 mg/dL (ref >39.00)
LDL Cholesterol: 58 mg/dL (ref 0–99)
NonHDL: 89.78
Total CHOL/HDL Ratio: 2
Triglycerides: 161 mg/dL — ABNORMAL HIGH (ref 0.0–149.0)
VLDL: 32.2 mg/dL (ref 0.0–40.0)

## 2022-06-22 LAB — BASIC METABOLIC PANEL
BUN: 21 mg/dL (ref 6–23)
CO2: 26 mEq/L (ref 19–32)
Calcium: 9.6 mg/dL (ref 8.4–10.5)
Chloride: 108 mEq/L (ref 96–112)
Creatinine, Ser: 0.85 mg/dL (ref 0.40–1.20)
GFR: 69.65 mL/min (ref >60.00)
Glucose, Bld: 94 mg/dL (ref 70–99)
Potassium: 3.7 mEq/L (ref 3.5–5.1)
Sodium: 141 mEq/L (ref 135–145)

## 2022-06-22 LAB — TSH: TSH: 1.81 u[IU]/mL (ref 0.35–5.50)

## 2022-06-22 LAB — VITAMIN D 25 HYDROXY (VIT D DEFICIENCY, FRACTURES): VITD: 54.08 ng/mL (ref 30.00–100.00)

## 2022-06-22 NOTE — Assessment & Plan Note (Signed)
Chronic problem.  Excellent control.  Currently asymptomatic.  Check labs due to diuretic use but no anticipated med changes.

## 2022-06-22 NOTE — Assessment & Plan Note (Signed)
Pt's PE WNL w/ exception of BMI.  UTD on mammo, DEXA, colonoscopy, immunizations.  Check labs.  Anticipatory guidance provided.

## 2022-06-22 NOTE — Patient Instructions (Addendum)
Follow up in 6 months to recheck BP and cholesterol We'll notify you of your lab results and make any changes if needed Keep up the good work on healthy diet and regular exercise- you're doing great!! Call with any questions or concerns Stay Safe!  Stay Healthy! Happy Holidays!!

## 2022-06-22 NOTE — Assessment & Plan Note (Signed)
Check Vit D level and replete prn.

## 2022-06-22 NOTE — Progress Notes (Signed)
   Subjective:    Patient ID: Alexandra [name], female    DOB: 12/27/1950, 71 y.o.   MRN: [id number]  HPI CPE- UTD on DEXA, mammo, colonoscopy, Tdap, PNA, flu  Patient Care Team    Relationship Specialty Notifications Start End  [name], MD PCP - General   07/23/10   [name], [facility] (Inactive) Pharmacist Pharmacist  11/22/19    Comment: PHONE NUMBER [phone]    Health Maintenance  Topic Date Due   COVID-19 Vaccine (7 - Pfizer risk series) 06/26/2022   Medicare Annual Wellness (AWV)  12/12/2022   DEXA SCAN  12/03/2023   MAMMOGRAM  01/22/2024   COLONOSCOPY (Pts 45-37yr Insurance coverage will need to be confirmed)  07/13/2026   TETANUS/TDAP  12/16/2026   Pneumonia Vaccine 70 Years old  Completed   INFLUENZA VACCINE  Completed   Hepatitis C Screening  Completed   Zoster Vaccines- Shingrix  Completed   HPV VACCINES  Aged Out      Review of Systems Patient reports no vision/ hearing changes, adenopathy,fever, weight change,  persistant/recurrent hoarseness , swallowing issues, chest pain, palpitations, edema, persistant/recurrent cough, hemoptysis, dyspnea (rest/exertional/paroxysmal nocturnal), gastrointestinal bleeding (melena, rectal bleeding), abdominal pain, significant heartburn, bowel changes, GU symptoms (dysuria, hematuria, incontinence), Gyn symptoms (abnormal  bleeding, pain),  syncope, focal weakness, memory loss, numbness & tingling, skin/hair/nail changes, abnormal bruising or bleeding, anxiety, or depression.     Objective:   Physical Exam General Appearance:    Alert, cooperative, no distress, appears stated age  Head:    Normocephalic, without obvious abnormality, atraumatic  Eyes:    PERRL, conjunctiva/corneas clear, EOM's intact, fundi    benign, both eyes  Ears:    Normal TM's and external ear canals, both ears  Nose:   Nares normal, septum midline, mucosa normal, no drainage    or sinus tenderness  Throat:   Lips, mucosa, and tongue normal; teeth  and gums normal  Neck:   Supple, symmetrical, trachea midline, no adenopathy;    Thyroid: no enlargement/tenderness/nodules  Back:     Symmetric, no curvature, ROM normal, no CVA tenderness  Lungs:     Clear to auscultation bilaterally, respirations unlabored  Chest Wall:    No tenderness or deformity   Heart:    Regular rate and rhythm, S1 and S2 normal, no murmur, rub   or gallop  Breast Exam:    Deferred to mammo  Abdomen:     Soft, non-tender, bowel sounds active all four quadrants,    no masses, no organomegaly  Genitalia:    Deferred  Rectal:    Extremities:   Extremities normal, atraumatic, no cyanosis or edema  Pulses:   2+ and symmetric all extremities  Skin:   Skin color, texture, turgor normal, no rashes or lesions  Lymph nodes:   Cervical, supraclavicular, and axillary nodes normal  Neurologic:   CNII-XII intact, normal strength, sensation and reflexes    throughout          Assessment & Plan:

## 2022-07-20 DIAGNOSIS — H43813 Vitreous degeneration, bilateral: Secondary | ICD-10-CM | POA: Diagnosis not present

## 2022-07-20 DIAGNOSIS — H53032 Strabismic amblyopia, left eye: Secondary | ICD-10-CM | POA: Diagnosis not present

## 2022-07-20 DIAGNOSIS — H401113 Primary open-angle glaucoma, right eye, severe stage: Secondary | ICD-10-CM | POA: Diagnosis not present

## 2022-07-20 DIAGNOSIS — H401122 Primary open-angle glaucoma, left eye, moderate stage: Secondary | ICD-10-CM | POA: Diagnosis not present

## 2022-07-20 DIAGNOSIS — Z961 Presence of intraocular lens: Secondary | ICD-10-CM | POA: Diagnosis not present

## 2022-08-26 ENCOUNTER — Encounter: Payer: Self-pay | Admitting: Family Medicine

## 2022-08-27 DIAGNOSIS — G4733 Obstructive sleep apnea (adult) (pediatric): Secondary | ICD-10-CM | POA: Diagnosis not present

## 2022-09-16 DIAGNOSIS — L570 Actinic keratosis: Secondary | ICD-10-CM | POA: Diagnosis not present

## 2022-10-22 DIAGNOSIS — H02831 Dermatochalasis of right upper eyelid: Secondary | ICD-10-CM | POA: Diagnosis not present

## 2022-10-22 DIAGNOSIS — H02834 Dermatochalasis of left upper eyelid: Secondary | ICD-10-CM | POA: Diagnosis not present

## 2022-10-22 DIAGNOSIS — H02413 Mechanical ptosis of bilateral eyelids: Secondary | ICD-10-CM | POA: Diagnosis not present

## 2022-10-22 DIAGNOSIS — H0279 Other degenerative disorders of eyelid and periocular area: Secondary | ICD-10-CM | POA: Diagnosis not present

## 2022-10-22 DIAGNOSIS — H53483 Generalized contraction of visual field, bilateral: Secondary | ICD-10-CM | POA: Diagnosis not present

## 2022-10-22 DIAGNOSIS — H57813 Brow ptosis, bilateral: Secondary | ICD-10-CM | POA: Diagnosis not present

## 2022-10-22 DIAGNOSIS — H02423 Myogenic ptosis of bilateral eyelids: Secondary | ICD-10-CM | POA: Diagnosis not present

## 2022-10-30 ENCOUNTER — Other Ambulatory Visit: Payer: Self-pay | Admitting: Family Medicine

## 2022-11-02 ENCOUNTER — Encounter (HOSPITAL_BASED_OUTPATIENT_CLINIC_OR_DEPARTMENT_OTHER): Payer: Self-pay | Admitting: Pulmonary Disease

## 2022-11-02 ENCOUNTER — Ambulatory Visit (HOSPITAL_BASED_OUTPATIENT_CLINIC_OR_DEPARTMENT_OTHER): Payer: Medicare Other | Admitting: Pulmonary Disease

## 2022-11-02 VITALS — BP 128/72 | HR 62 | Ht 62.0 in | Wt 178.2 lb

## 2022-11-02 DIAGNOSIS — G4733 Obstructive sleep apnea (adult) (pediatric): Secondary | ICD-10-CM

## 2022-11-02 NOTE — Patient Instructions (Signed)
Will arrange for new CPAP machine  Follow up in 4 months 

## 2022-11-02 NOTE — Progress Notes (Signed)
[facility]  Chief Complaint  Patient presents with   Follow-up    6 mo f/u for asthma and OSA. States her current machine has acting weird for the past few months. She has been trying to use her machine nightly despite this.     Past Surgical History:  She  has a past surgical history that includes Dermoid cyst  excision; Cataract extraction; Colonoscopy; and Elbow fracture surgery (2004).  Past Medical History:  Osteopenia, HTN, HLD, Glaucoma, Depression, Cataracts   Constitutional:  BP 128/72   Pulse 62   Ht 5\' 2"  (1.575 m)   Wt 178 lb 3.2 oz (80.8 kg)   SpO2 99% Comment: on RA  BMI 32.59 kg/m   Brief Summary:  Alexandra [name] is a 72 y.o. female with obstructive sleep apnea.      Subjective:   I last saw her in 2020.  She had asthmatic bronchitis last year, but this resolved and not using inhaler therapy at this time.  Not having cough, wheeze, or sputum.  Uses CPAP nightly.  Machine continues to make noise.  Uses nasal pillows.  Physical Exam:   Appearance - well kempt   ENMT - no sinus tenderness, no oral exudate, no LAN, Mallampati 3 airway, no stridor  Respiratory - equal breath sounds bilaterally, no wheezing or rales  CV - s1s2 regular rate and rhythm, no murmurs  Ext - no clubbing, no edema  Skin - no rashes  Psych - normal mood and affect   Pulmonary testing:  RAST 10/02/21 >> negative, IgE 3 PFT 11/04/21 >> FEV1 2.28 (107%), FEV1% 80, TLC 4.85 (102%), DLCO 125%  Sleep Tests:  HST 10/02/17 >> AHI 8, SaO2 low 81%. Auto CPAP 09/30/22 to 10/29/22 >> used on 30 of 30 nights with average 5 hrs 8 min.  Average AHI 0.4 with median CPAP 10 and 95 th percentile CPAP 13 cm H2O  Social History:  She  reports that she has never smoked. She has never used smokeless tobacco. She reports current alcohol use of about 2.0 standard drinks of alcohol per week. She reports that she does not use drugs.  Family History:  Her  family history includes Esophageal cancer in her maternal uncle; Heart attack in her father; Hodgkin's lymphoma (age of onset: 61) in an other family member; Lung cancer in her maternal grandfather; Lung cancer (age of onset: 44) in her sister; Ovarian cancer (age of onset: 97) in her mother; Throat cancer (age of onset: 73) in her brother.     Assessment/Plan:   Obstructive sleep apnea. - she is compliant with CPAP and reports benefit from therapy - she uses [employer] for her DME - her current CPAP is more than 71 yrs old - will arrange for new [employer]med auto CPAP 5 - 15 cm H2O   Time Spent Involved in Patient Care on Day of Examination:  26 minutes  Follow up:   Patient Instructions  Will arrange for new CPAP machine  Follow up in 4 months  Medication List:   Allergies as of 11/02/2022   No Known Allergies      Medication List        Accurate as of November 02, 2022  1:48 PM. If you have any questions, ask your nurse or doctor.          acetaZOLAMIDE ER 500 MG capsule Commonly known as: DIAMOX Take 500 mg by mouth 2 (two) times daily.  brimonidine 0.2 % ophthalmic solution Commonly known as: ALPHAGAN   CALCIUM 1200 PO Take by mouth.   cetirizine 10 MG tablet Commonly known as: ZYRTEC Take 1 tablet (10 mg total) by mouth daily.   dorzolamide-timolol 2-0.5 % ophthalmic solution Commonly known as: COSOPT   Glucosamine 1500 Complex Caps Take by mouth.   hydrochlorothiazide 12.5 MG tablet Commonly known as: HYDRODIURIL TAKE 1 TABLET BY MOUTH  DAILY   Lumigan 0.01 % Soln Generic drug: bimatoprost   MULTIVITAMIN ADULT PO Take by mouth.   omeprazole 20 MG capsule Commonly known as: PRILOSEC Take 1 capsule by mouth once daily   simvastatin 20 MG tablet Commonly known as: ZOCOR TAKE 1 TABLET BY MOUTH AT  BEDTIME   SUPER B COMPLEX PO Take by mouth.   tretinoin 0.05 % cream Commonly known as: RETIN-A Apply topically at bedtime.   valACYclovir 1000 MG  tablet Commonly known as: VALTREX SMARTSIG:2 Tablet(s) By Mouth   vitamin C 1000 MG tablet Take 1,000 mg by mouth daily.   vitamin E 180 MG (400 UNITS) capsule Take 400 Units by mouth daily.        Signature:  Chesley Mires, MD [place] Pager - [phone] 11/02/2022, 1:48 PM

## 2022-11-10 DIAGNOSIS — G4733 Obstructive sleep apnea (adult) (pediatric): Secondary | ICD-10-CM | POA: Diagnosis not present

## 2022-11-11 DIAGNOSIS — H53483 Generalized contraction of visual field, bilateral: Secondary | ICD-10-CM | POA: Diagnosis not present

## 2022-11-18 DIAGNOSIS — H02423 Myogenic ptosis of bilateral eyelids: Secondary | ICD-10-CM | POA: Diagnosis not present

## 2022-11-18 DIAGNOSIS — H401113 Primary open-angle glaucoma, right eye, severe stage: Secondary | ICD-10-CM | POA: Diagnosis not present

## 2022-11-18 DIAGNOSIS — H53032 Strabismic amblyopia, left eye: Secondary | ICD-10-CM | POA: Diagnosis not present

## 2022-11-18 DIAGNOSIS — Z961 Presence of intraocular lens: Secondary | ICD-10-CM | POA: Diagnosis not present

## 2022-11-18 DIAGNOSIS — H43813 Vitreous degeneration, bilateral: Secondary | ICD-10-CM | POA: Diagnosis not present

## 2022-11-18 DIAGNOSIS — H401122 Primary open-angle glaucoma, left eye, moderate stage: Secondary | ICD-10-CM | POA: Diagnosis not present

## 2022-11-25 ENCOUNTER — Other Ambulatory Visit: Payer: Self-pay | Admitting: Family Medicine

## 2022-11-25 DIAGNOSIS — Z1231 Encounter for screening mammogram for malignant neoplasm of breast: Secondary | ICD-10-CM

## 2022-12-01 IMAGING — MG MM DIGITAL DIAGNOSTIC UNILAT*L* W/ TOMO W/ CAD
4 series · 4 of 12 positions shown · non-contrast
Comparison: Previous exam(s).

CLINICAL DATA: Nonfocal left breast pain

EXAM:
DIGITAL DIAGNOSTIC UNILATERAL LEFT MAMMOGRAM WITH TOMOSYNTHESIS AND
CAD
TECHNIQUE: Left digital diagnostic mammography and breast tomosynthesis was
performed. The images were evaluated with computer-aided detection.

[L MLO synth-2D]
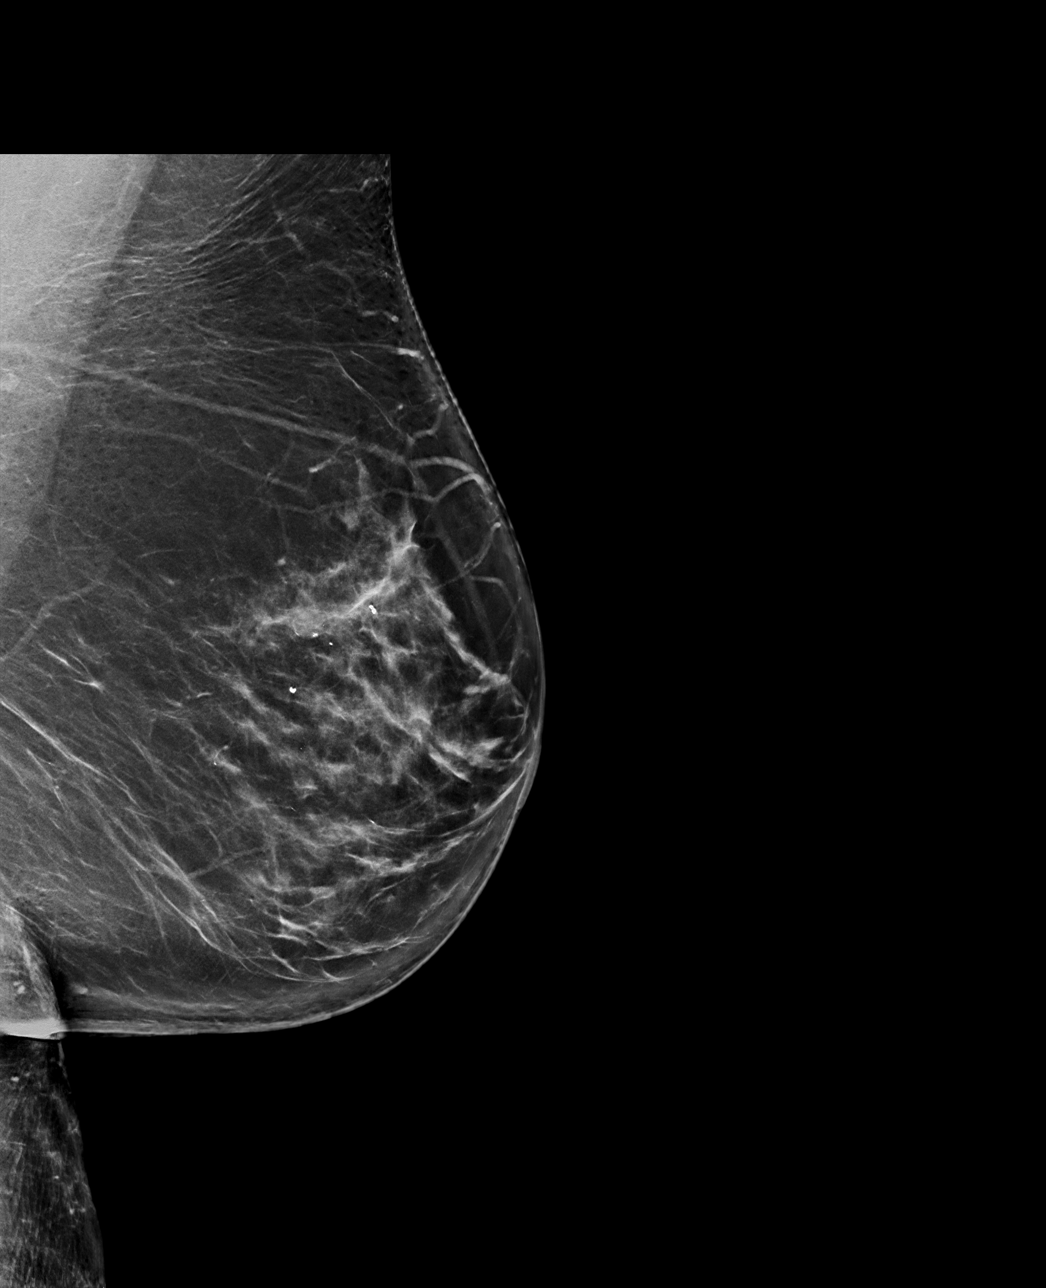

[L CC synth-2D]
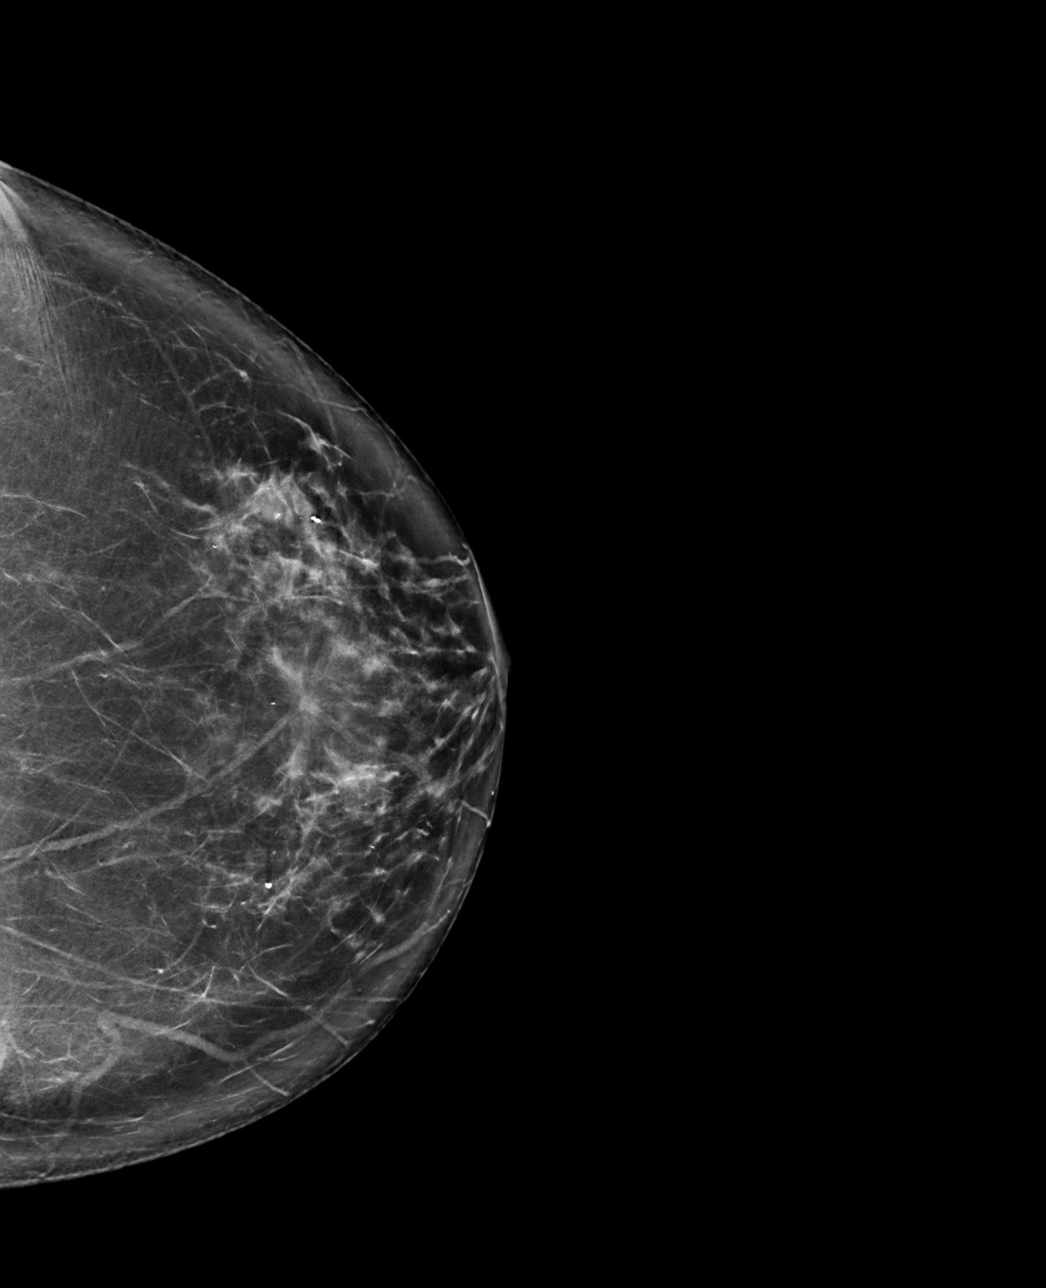

[L CC tomo · tomo slice 43/85.0]
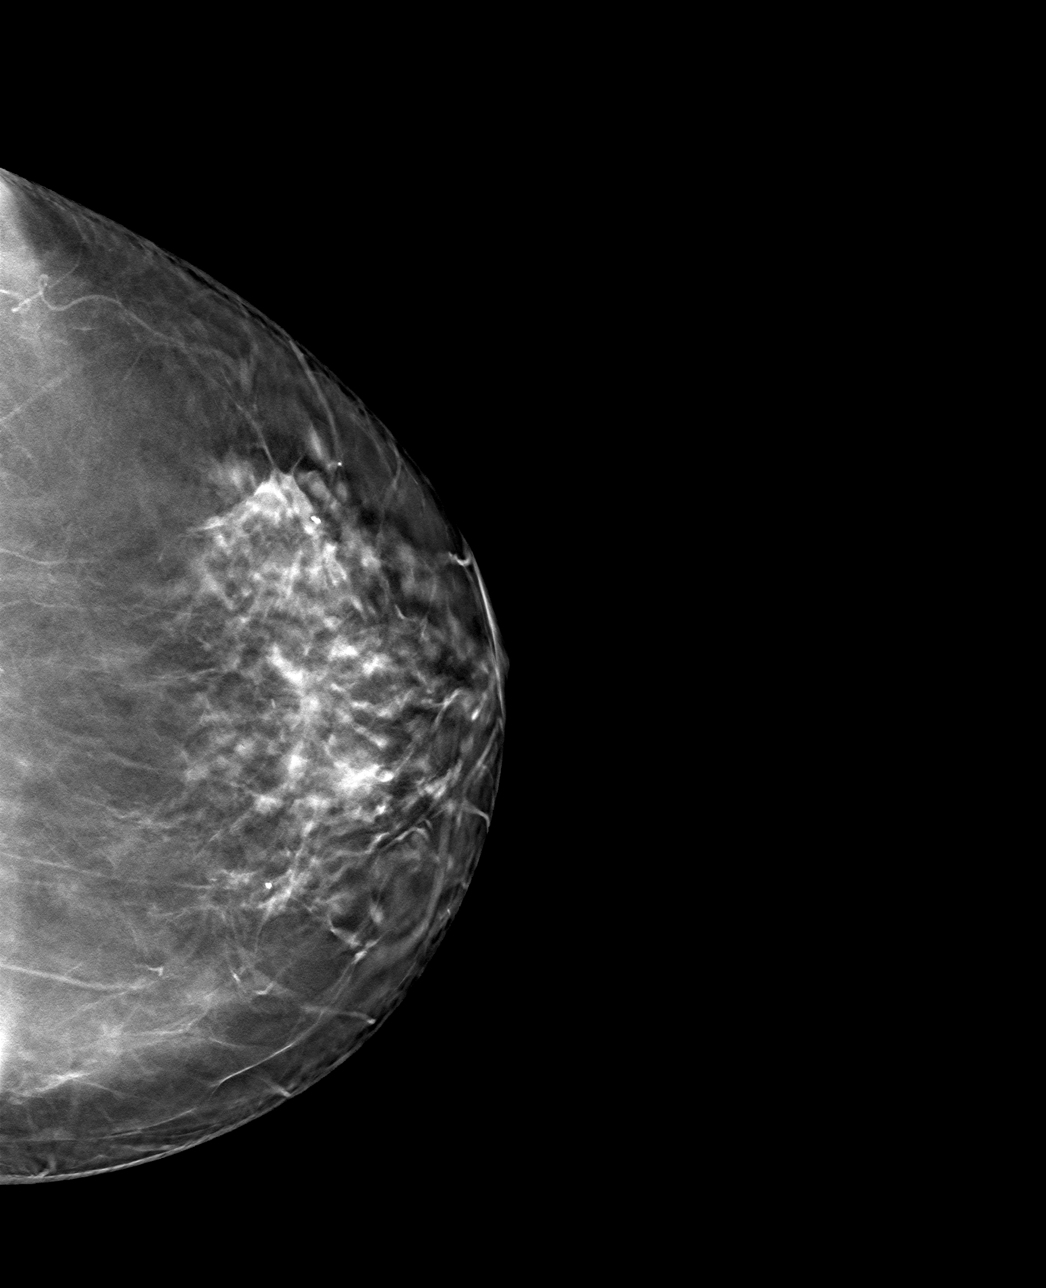

[L MLO tomo · tomo slice 45/90.0]
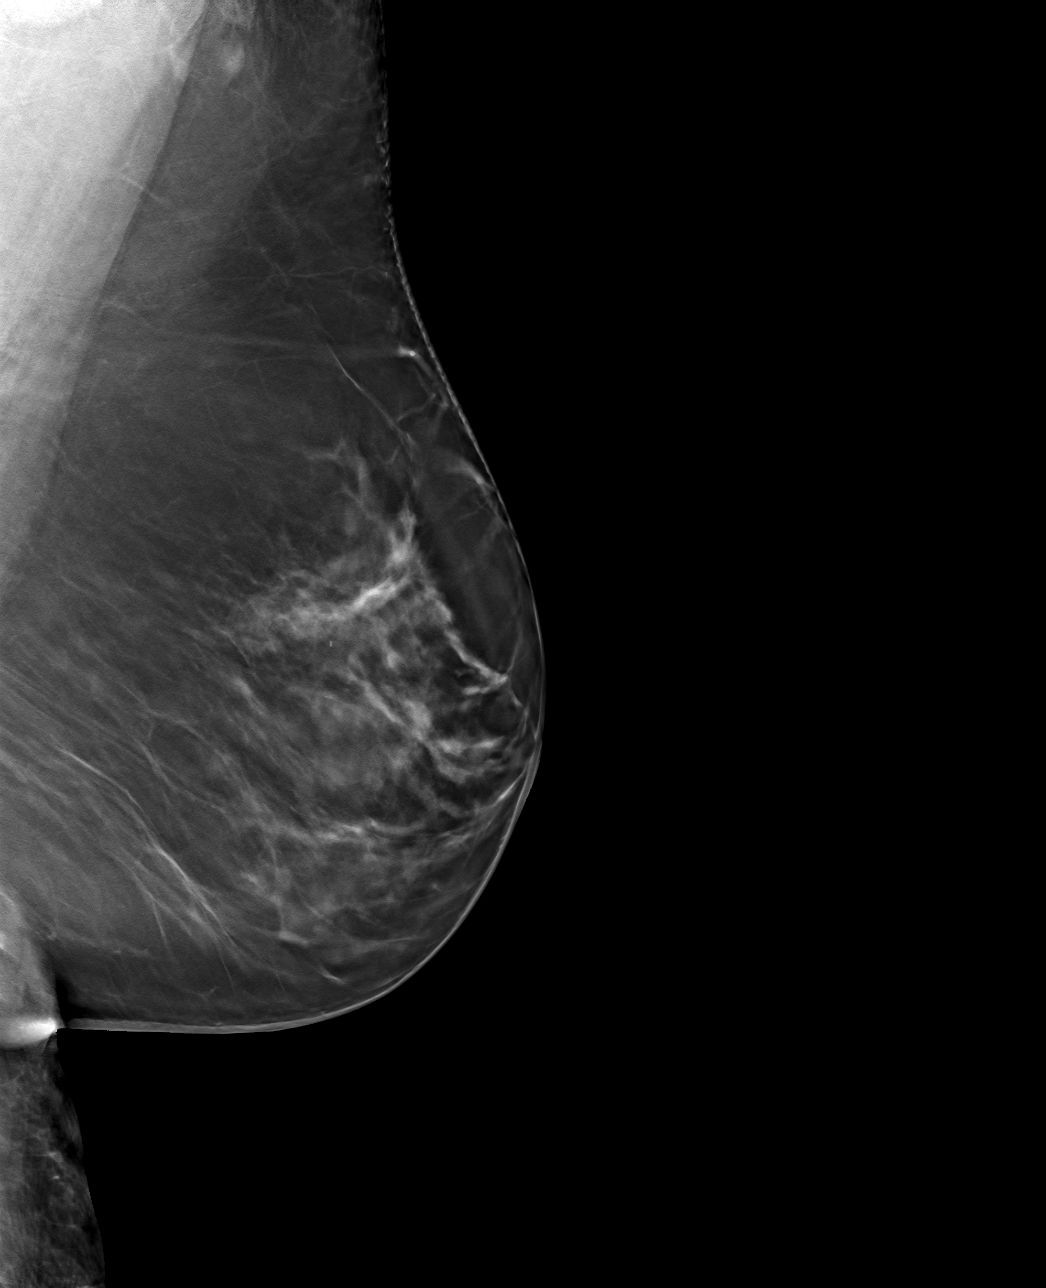

[4 of 12 positions shown; findings below may reference images not displayed]

ACR Breast Density Category b: There are scattered areas of
fibroglandular density.
FINDINGS: No suspicious masses, calcifications, or distortion are identified
in the left breast.
IMPRESSION: No mammographic evidence of malignancy.

RECOMMENDATION:
Treatment of the patient's pain should be based on clinical and
physical exam given lack of imaging findings. Recommend annual
screening mammography.

I have discussed the findings and recommendations with the patient.
If applicable, a reminder letter will be sent to the patient
regarding the next appointment.

BI-RADS CATEGORY  2: Benign.

## 2022-12-09 ENCOUNTER — Telehealth: Payer: Self-pay | Admitting: Family Medicine

## 2022-12-09 NOTE — Telephone Encounter (Signed)
Contacted [name] to schedule their annual wellness visit. Appointment made for 12/17/2022.  Thank you,  [facility] Support [facility] Direct dial  [phone]

## 2022-12-10 DIAGNOSIS — G4733 Obstructive sleep apnea (adult) (pediatric): Secondary | ICD-10-CM | POA: Diagnosis not present

## 2022-12-17 ENCOUNTER — Ambulatory Visit (INDEPENDENT_AMBULATORY_CARE_PROVIDER_SITE_OTHER): Payer: Medicare Other | Admitting: *Deleted

## 2022-12-17 DIAGNOSIS — Z0000 Encounter for general adult medical examination without abnormal findings: Secondary | ICD-10-CM

## 2022-12-17 NOTE — Progress Notes (Signed)
Subjective:   Alexandra [name] is a 72 y.o. female who presents for Medicare Annual (Subsequent) preventive examination.  I connected with  Alexandra [name] on 12/17/22 by a telephone enabled telemedicine application and verified that I am speaking with the correct person using two identifiers.   I discussed the limitations of evaluation and management by telemedicine. The patient expressed understanding and agreed to proceed.  Patient location: home  Provider location: telephone/home   Review of Systems     Cardiac Risk Factors include: advanced age (>72men, >75 women);hypertension     Objective:    Today's Vitals   There is no height or weight on file to calculate BMI.     12/17/2022    1:34 PM 12/11/2021   11:47 AM 05/13/2020   11:18 AM 12/24/2017    9:36 AM  Advanced Directives  Does Patient Have a Medical Advance Directive? Yes Yes No No  Type of Estate agent of [employer] Power of [place];Living will    Copy of Healthcare Power of Attorney in Chart? No - copy requested No - copy requested    Would patient like information on creating a medical advance directive?   Yes (MAU/Ambulatory/Procedural Areas - Information given) Yes (MAU/Ambulatory/Procedural Areas - Information given)    Current Medications (verified) Outpatient Encounter Medications as of 12/17/2022  Medication Sig   acetaZOLAMIDE (DIAMOX) 500 MG capsule Take 500 mg by mouth 2 (two) times daily.   Ascorbic Acid (VITAMIN C) 1000 MG tablet Take 1,000 mg by mouth daily.   B Complex-C (SUPER B COMPLEX PO) Take by mouth.   brimonidine (ALPHAGAN) 0.2 % ophthalmic solution    Calcium Carbonate-Vit D-Min (CALCIUM 1200 PO) Take by mouth.   cetirizine (ZYRTEC) 10 MG tablet Take 1 tablet (10 mg total) by mouth daily.   dorzolamide-timolol (COSOPT) 22.3-6.8 MG/ML ophthalmic solution    Glucosamine-Chondroit-Vit C-Mn (GLUCOSAMINE 1500 COMPLEX) CAPS Take by mouth.   hydrochlorothiazide  (HYDRODIURIL) 12.5 MG tablet TAKE 1 TABLET BY MOUTH  DAILY   LUMIGAN 0.01 % SOLN    Multiple Vitamins-Minerals (MULTIVITAMIN ADULT PO) Take by mouth.   omeprazole (PRILOSEC) 20 MG capsule Take 1 capsule by mouth once daily   simvastatin (ZOCOR) 20 MG tablet TAKE 1 TABLET BY MOUTH AT  BEDTIME   tretinoin (RETIN-A) 0.05 % cream Apply topically at bedtime.   valACYclovir (VALTREX) 1000 MG tablet SMARTSIG:2 Tablet(s) By Mouth   vitamin E 400 UNIT capsule Take 400 Units by mouth daily.   No facility-administered encounter medications on file as of 12/17/2022.    Allergies (verified) Patient has no known allergies.   History: Past Medical History:  Diagnosis Date   Cataract    REMOVED BOTH - LENS IMPLANTS   Depression    Family history of ovarian cancer    Glaucoma    Hyperlipidemia    Hypertension    OSA (obstructive sleep apnea) 10/05/2017   Osteopenia    Sleep apnea    WEARS CPAP   Past Surgical History:  Procedure Laterality Date   CATARACT EXTRACTION     11/04/17   COLONOSCOPY     DERMOID CYST  EXCISION     ELBOW FRACTURE SURGERY  2004   Family History  Problem Relation Age of Onset   Heart attack Father    Lung cancer Sister 66       smoker   Throat cancer Brother 47       smoker   Ovarian cancer Mother 45  Esophageal cancer Maternal Uncle    Lung cancer Maternal Grandfather        dx in his 57s   Hodgkin's lymphoma Other 16   Colon cancer Neg Hx    Colon polyps Neg Hx    Rectal cancer Neg Hx    Stomach cancer Neg Hx    Social History   Socioeconomic History   Marital status: Married    Spouse name: Not on file   Number of children: 2   Years of education: Not on file   Highest education level: Not on file  Occupational History   Occupation: retired  Tobacco Use   Smoking status: Never   Smokeless tobacco: Never  Vaping Use   Vaping Use: Never used  Substance and Sexual Activity   Alcohol use: Yes    Alcohol/week: 2.0 standard drinks of alcohol     Types: 2 Glasses of wine per week   Drug use: No   Sexual activity: Not on file  Other Topics Concern   Not on file  Social History Narrative   Not on file   Social Determinants of Health   Financial Resource Strain: Low Risk  (12/17/2022)   Overall Financial Resource Strain (CARDIA)    Difficulty of Paying Living Expenses: Not hard at all  Food Insecurity: No Food Insecurity (12/17/2022)   Hunger Vital Sign    Worried About Running Out of Food in the Last Year: Never true    Ran Out of Food in the Last Year: Never true  Transportation Needs: No Transportation Needs (12/17/2022)   PRAPARE - Administrator, Civil Service (Medical): No    Lack of Transportation (Non-Medical): No  Physical Activity: Sufficiently Active (12/17/2022)   Exercise Vital Sign    Days of Exercise per Week: 7 days    Minutes of Exercise per Session: 50 min  Stress: No Stress Concern Present (12/17/2022)   [employer] of Occupational Health - Occupational Stress Questionnaire    Feeling of Stress : Not at all  Social Connections: Socially Integrated (12/17/2022)   Social Connection and Isolation Panel [NHANES]    Frequency of Communication with Friends and Family: More than three times a week    Frequency of Social Gatherings with Friends and Family: Twice a week    Attends Religious Services: More than 4 times per year    Active Member of [employer] or Organizations: Yes    Attends Engineer, structural: More than 4 times per year    Marital Status: Married    Tobacco Counseling Counseling given: Not Answered   Clinical Intake:  Pre-visit preparation completed: Yes  Pain : No/denies pain     Diabetes: No  How often do you need to have someone help you when you read instructions, pamphlets, or other written materials from your doctor or pharmacy?: 1 - Never  Diabetic?    Interpreter Needed?: No  Information entered by :: Ahnesti Townsend lpn   Activities of Daily Living     12/17/2022    1:36 PM 12/13/2022    8:58 AM  In your present state of health, do you have any difficulty performing the following activities:  Hearing? 0 0  Vision? 0 0  Difficulty concentrating or making decisions? 0 0  Walking or climbing stairs? 0 0  Dressing or bathing? 0 0  Doing errands, shopping? 0 0  Preparing Food and eating ? N N  Using the Toilet? N N  In the past six  months, have you accidently leaked urine? N N  Do you have problems with loss of bowel control? N N  Managing your Medications? N N  Managing your Finances? N N  Housekeeping or managing your Housekeeping? N N    Patient Care Team: Sheliah Hatch, MD as PCP - General Dahlia Byes, [facility] (Inactive) as Pharmacist (Pharmacist)  Indicate any recent Medical Services you may have received from other than [facility] providers in the past year (date may be approximate).     Assessment:   This is a routine wellness examination for [place].  Hearing/Vision screen Hearing Screening - Comments:: No trouble hearing Vision Screening - Comments:: [facility] Up to date  Dietary issues and exercise activities discussed: Current Exercise Habits: Structured exercise class, Type of exercise: walking;strength training/weights;stretching, Time (Minutes): 40 (spin class), Frequency (Times/Week): 7, Weekly Exercise (Minutes/Week): 280, Intensity: Moderate   Goals Addressed             This Visit's Progress    Patient Stated       Meet with a nutritionist get diet better       Depression Screen    12/17/2022    1:41 PM 06/22/2022   10:58 AM 12/16/2021   11:01 AM 12/11/2021   11:48 AM 12/11/2021   11:46 AM 09/16/2021   11:02 AM 08/25/2021    1:31 PM  PHQ 2/9 Scores  PHQ - 2 Score 0 0 0 0 0 0 0  PHQ- 9 Score 0 0 0   0 0    Fall Risk    12/13/2022    8:58 AM 06/22/2022   10:58 AM 12/16/2021   11:01 AM 12/11/2021   11:48 AM 09/16/2021   11:02 AM  Fall Risk   Falls in the past year? 0 0 0 0 0  Number falls in past yr:   0 0    Injury with Fall?   0 0   Risk for fall due to :  No Fall Risks No Fall Risks  No Fall Risks  Follow up  Falls evaluation completed Falls evaluation completed Falls evaluation completed Falls evaluation completed    FALL RISK PREVENTION PERTAINING TO THE HOME:  Any stairs in or around the home? Yes  If so, are there any without handrails? No  Home free of loose throw rugs in walkways, pet beds, electrical cords, etc? Yes  Adequate lighting in your home to reduce risk of falls? Yes   ASSISTIVE DEVICES UTILIZED TO PREVENT FALLS:  Life alert? No  Use of a cane, walker or w/c? No  Grab bars in the bathroom? Yes  Shower chair or bench in shower? Yes  Elevated toilet seat or a handicapped toilet? No   TIMED UP AND GO:  Was the test performed? No .    Cognitive Function:        12/17/2022    1:37 PM  6CIT Screen  What Year? 0 points  What month? 0 points  What time? 0 points  Count back from 20 0 points  Months in reverse 0 points  Repeat phrase 2 points  Total Score 2 points    Immunizations Immunization History  Administered Date(s) Administered   Fluad Quad(high Dose 65+) 05/15/2020, 06/18/2021   Influenza Whole 06/30/2007   Influenza,inj,Quad PF,6+ Mos 05/12/2013, 05/14/2014, 06/01/2014, 06/10/2015, 06/17/2016, 05/12/2017, 05/18/2018, 04/04/2019   Influenza,trivalent, recombinat, inj, PF 05/01/2022   PFIZER SARS-COV-2 Pediatric Vaccination 5-78yrs 06/14/2020   PFIZER(Purple Top)SARS-COV-2 Vaccination 08/30/2019, 09/20/2019, 06/14/2020, 12/19/2020   Pfizer  Covid-19 Vaccine Bivalent Booster 80yrs & up 08/12/2021, 05/01/2022   Pneumococcal Conjugate-13 08/11/2017   Pneumococcal Polysaccharide-23 04/04/2019   Td 09/11/2006   Tdap 12/15/2016   Zoster Recombinat (Shingrix) 05/08/2021, 09/03/2021   Zoster, Live 05/08/2011    TDAP status: Up to date  Flu Vaccine status: Up to date  Pneumococcal vaccine status: Up to date  Covid-19 vaccine status: Information  provided on how to obtain vaccines.   Qualifies for Shingles Vaccine? No   Zostavax completed Yes   Shingrix Completed?: Yes  Screening Tests Health Maintenance  Topic Date Due   COVID-19 Vaccine (7 - 2023-24 season) 06/26/2022   INFLUENZA VACCINE  03/11/2023   DEXA SCAN  12/03/2023   Medicare Annual Wellness (AWV)  12/17/2023   MAMMOGRAM  01/22/2024   COLONOSCOPY (Pts 45-72yrs Insurance coverage will need to be confirmed)  07/13/2026   DTaP/Tdap/Td (3 - Td or Tdap) 12/16/2026   Pneumonia Vaccine 78+ Years old  Completed   Hepatitis C Screening  Completed   Zoster Vaccines- Shingrix  Completed   HPV VACCINES  Aged Out    Health Maintenance  Health Maintenance Due  Topic Date Due   COVID-19 Vaccine (7 - 2023-24 season) 06/26/2022    Colorectal cancer screening: Type of screening: Colonoscopy. Completed 2020. Repeat every 7 years  Mammogram scheduled  Bone density 2023  repeat 2 years  Lung Cancer Screening: (Low Dose CT Chest recommended if Age 68-80 years, 30 pack-year currently smoking OR have quit w/in 15years.) does not qualify.   Lung Cancer Screening Referral:   Additional Screening:  Hepatitis C Screening: does not qualify; Completed 2019  Vision Screening: Recommended annual ophthalmology exams for early detection of glaucoma and other disorders of the eye. Is the patient up to date with their annual eye exam?  Yes  Who is the provider or what is the name of the office in which the patient attends annual eye exams? [facility] If pt is not established with a provider, would they like to be referred to a provider to establish care? No .   Dental Screening: Recommended annual dental exams for proper oral hygiene  Community Resource Referral / Chronic Care Management: CRR required this visit?  No   CCM required this visit?  No      Plan:     I have personally reviewed and noted the following in the patient's chart:   Medical and social history Use of  alcohol, tobacco or illicit drugs  Current medications and supplements including opioid prescriptions. Patient is not currently taking opioid prescriptions. Functional ability and status Nutritional status Physical activity Advanced directives List of other physicians Hospitalizations, surgeries, and ER visits in previous 12 months Vitals Screenings to include cognitive, depression, and falls Referrals and appointments  In addition, I have reviewed and discussed with patient certain preventive protocols, quality metrics, and best practice recommendations. A written personalized care plan for preventive services as well as general preventive health recommendations were provided to patient.     Remi Haggard, LPN   08/15/1094   Nurse Notes:

## 2022-12-17 NOTE — Patient Instructions (Signed)
Alexandra [name] , Thank you for taking time to come for your Medicare Wellness Visit. I appreciate your ongoing commitment to your health goals. Please review the following plan we discussed and let me know if I can assist you in the future.   Screening recommendations/referrals: Colonoscopy: up to date Mammogram: scheduled Bone Density: up to date Recommended yearly ophthalmology/optometry visit for glaucoma screening and checkup Recommended yearly dental visit for hygiene and checkup  Vaccinations: Influenza vaccine: up to date Pneumococcal vaccine: up to date Tdap vaccine: up to date Shingles vaccine: up to date       Preventive Care 65 Years and Older, Female Preventive care refers to lifestyle choices and visits with your health care provider that can promote health and wellness. What does preventive care include? A yearly physical exam. This is also called an annual well check. Dental exams once or twice a year. Routine eye exams. Ask your health care provider how often you should have your eyes checked. Personal lifestyle choices, including: Daily care of your teeth and gums. Regular physical activity. Eating a healthy diet. Avoiding tobacco and drug use. Limiting alcohol use. Practicing safe sex. Taking low-dose aspirin every day. Taking vitamin and mineral supplements as recommended by your health care provider. What happens during an annual well check? The services and screenings done by your health care provider during your annual well check will depend on your age, overall health, lifestyle risk factors, and family history of disease. Counseling  Your health care provider may ask you questions about your: Alcohol use. Tobacco use. Drug use. Emotional well-being. Home and relationship well-being. Sexual activity. Eating habits. History of falls. Memory and ability to understand (cognition). Work and work Astronomer. Reproductive health. Screening  You may have  the following tests or measurements: Height, weight, and BMI. Blood pressure. Lipid and cholesterol levels. These may be checked every 5 years, or more frequently if you are over 89 years old. Skin check. Lung cancer screening. You may have this screening every year starting at age 26 if you have a 30-pack-year history of smoking and currently smoke or have quit within the past 15 years. Fecal occult blood test (FOBT) of the stool. You may have this test every year starting at age 7. Flexible sigmoidoscopy or colonoscopy. You may have a sigmoidoscopy every 5 years or a colonoscopy every 10 years starting at age 60. Hepatitis C blood test. Hepatitis B blood test. Sexually transmitted disease (STD) testing. Diabetes screening. This is done by checking your blood sugar (glucose) after you have not eaten for a while (fasting). You may have this done every 1-3 years. Bone density scan. This is done to screen for osteoporosis. You may have this done starting at age 56. Mammogram. This may be done every 1-2 years. Talk to your health care provider about how often you should have regular mammograms. Talk with your health care provider about your test results, treatment options, and if necessary, the need for more tests. Vaccines  Your health care provider may recommend certain vaccines, such as: Influenza vaccine. This is recommended every year. Tetanus, diphtheria, and acellular pertussis (Tdap, Td) vaccine. You may need a Td booster every 10 years. Zoster vaccine. You may need this after age 49. Pneumococcal 13-valent conjugate (PCV13) vaccine. One dose is recommended after age 31. Pneumococcal polysaccharide (PPSV23) vaccine. One dose is recommended after age 81. Talk to your health care provider about which screenings and vaccines you need and how often you need them. This information  is not intended to replace advice given to you by your health care provider. Make sure you discuss any questions  you have with your health care provider. Document Released: 08/23/2015 Document Revised: 04/15/2016 Document Reviewed: 05/28/2015 Elsevier Interactive Patient Education  2017 [employer].  Fall Prevention in the Home Falls can cause injuries. They can happen to people of all ages. There are many things you can do to make your home safe and to help prevent falls. What can I do on the outside of my home? Regularly fix the edges of walkways and driveways and fix any cracks. Remove anything that might make you trip as you walk through a door, such as a raised step or threshold. Trim any bushes or trees on the path to your home. Use bright outdoor lighting. Clear any walking paths of anything that might make someone trip, such as rocks or tools. Regularly check to see if handrails are loose or broken. Make sure that both sides of any steps have handrails. Any raised decks and porches should have guardrails on the edges. Have any leaves, snow, or ice cleared regularly. Use sand or salt on walking paths during winter. Clean up any spills in your garage right away. This includes oil or grease spills. What can I do in the bathroom? Use night lights. Install grab bars by the toilet and in the tub and shower. Do not use towel bars as grab bars. Use non-skid mats or decals in the tub or shower. If you need to sit down in the shower, use a plastic, non-slip stool. Keep the floor dry. Clean up any water that spills on the floor as soon as it happens. Remove soap buildup in the tub or shower regularly. Attach bath mats securely with double-sided non-slip rug tape. Do not have throw rugs and other things on the floor that can make you trip. What can I do in the bedroom? Use night lights. Make sure that you have a light by your bed that is easy to reach. Do not use any sheets or blankets that are too big for your bed. They should not hang down onto the floor. Have a firm chair that has side arms. You  can use this for support while you get dressed. Do not have throw rugs and other things on the floor that can make you trip. What can I do in the kitchen? Clean up any spills right away. Avoid walking on wet floors. Keep items that you use a lot in easy-to-reach places. If you need to reach something above you, use a strong step stool that has a grab bar. Keep electrical cords out of the way. Do not use floor polish or wax that makes floors slippery. If you must use wax, use non-skid floor wax. Do not have throw rugs and other things on the floor that can make you trip. What can I do with my stairs? Do not leave any items on the stairs. Make sure that there are handrails on both sides of the stairs and use them. Fix handrails that are broken or loose. Make sure that handrails are as long as the stairways. Check any carpeting to make sure that it is firmly attached to the stairs. Fix any carpet that is loose or worn. Avoid having throw rugs at the top or bottom of the stairs. If you do have throw rugs, attach them to the floor with carpet tape. Make sure that you have a light switch at the top of  the stairs and the bottom of the stairs. If you do not have them, ask someone to add them for you. What else can I do to help prevent falls? Wear shoes that: Do not have high heels. Have rubber bottoms. Are comfortable and fit you well. Are closed at the toe. Do not wear sandals. If you use a stepladder: Make sure that it is fully opened. Do not climb a closed stepladder. Make sure that both sides of the stepladder are locked into place. Ask someone to hold it for you, if possible. Clearly mark and make sure that you can see: Any grab bars or handrails. First and last steps. Where the edge of each step is. Use tools that help you move around (mobility aids) if they are needed. These include: Canes. Walkers. Scooters. Crutches. Turn on the lights when you go into a dark area. Replace any  light bulbs as soon as they burn out. Set up your furniture so you have a clear path. Avoid moving your furniture around. If any of your floors are uneven, fix them. If there are any pets around you, be aware of where they are. Review your medicines with your doctor. Some medicines can make you feel dizzy. This can increase your chance of falling. Ask your doctor what other things that you can do to help prevent falls. This information is not intended to replace advice given to you by your health care provider. Make sure you discuss any questions you have with your health care provider. Document Released: 05/23/2009 Document Revised: 01/02/2016 Document Reviewed: 08/31/2014 Elsevier Interactive Patient Education  2017 [employer].

## 2022-12-21 ENCOUNTER — Ambulatory Visit (INDEPENDENT_AMBULATORY_CARE_PROVIDER_SITE_OTHER): Payer: Medicare Other | Admitting: Family Medicine

## 2022-12-21 ENCOUNTER — Encounter: Payer: Self-pay | Admitting: Family Medicine

## 2022-12-21 VITALS — BP 120/72 | HR 69 | Temp 97.8°F | Resp 17 | Ht 62.0 in | Wt 171.1 lb

## 2022-12-21 DIAGNOSIS — E669 Obesity, unspecified: Secondary | ICD-10-CM | POA: Diagnosis not present

## 2022-12-21 DIAGNOSIS — Z6830 Body mass index (BMI) 30.0-30.9, adult: Secondary | ICD-10-CM

## 2022-12-21 DIAGNOSIS — I10 Essential (primary) hypertension: Secondary | ICD-10-CM

## 2022-12-21 DIAGNOSIS — E785 Hyperlipidemia, unspecified: Secondary | ICD-10-CM

## 2022-12-21 LAB — CBC WITH DIFFERENTIAL/PLATELET
Basophils Absolute: 0 10*3/uL (ref 0.0–0.1)
Basophils Relative: 0.7 % (ref 0.0–3.0)
Eosinophils Absolute: 0.1 10*3/uL (ref 0.0–0.7)
Eosinophils Relative: 2.6 % (ref 0.0–5.0)
HCT: 40.6 % (ref 36.0–46.0)
Hemoglobin: 13.4 g/dL (ref 12.0–15.0)
Lymphocytes Relative: 33.2 % (ref 12.0–46.0)
Lymphs Abs: 1.4 10*3/uL (ref 0.7–4.0)
MCHC: 33 g/dL (ref 30.0–36.0)
MCV: 85 fl (ref 78.0–100.0)
Monocytes Absolute: 0.4 10*3/uL (ref 0.1–1.0)
Monocytes Relative: 8.7 % (ref 3.0–12.0)
Neutro Abs: 2.3 10*3/uL (ref 1.4–7.7)
Neutrophils Relative %: 54.8 % (ref 43.0–77.0)
Platelets: 274 10*3/uL (ref 150.0–400.0)
RBC: 4.78 Mil/uL (ref 3.87–5.11)
RDW: 14 % (ref 11.5–15.5)
WBC: 4.3 10*3/uL (ref 4.0–10.5)

## 2022-12-21 LAB — HEPATIC FUNCTION PANEL
ALT: 13 U/L (ref 0–35)
AST: 21 U/L (ref 0–37)
Albumin: 4.2 g/dL (ref 3.5–5.2)
Alkaline Phosphatase: 42 U/L (ref 39–117)
Bilirubin, Direct: 0.2 mg/dL (ref 0.0–0.3)
Total Bilirubin: 1.3 mg/dL — ABNORMAL HIGH (ref 0.2–1.2)
Total Protein: 6.8 g/dL (ref 6.0–8.3)

## 2022-12-21 LAB — BASIC METABOLIC PANEL
BUN: 16 mg/dL (ref 6–23)
CO2: 23 mEq/L (ref 19–32)
Calcium: 9.7 mg/dL (ref 8.4–10.5)
Chloride: 109 mEq/L (ref 96–112)
Creatinine, Ser: 0.96 mg/dL (ref 0.40–1.20)
GFR: 59.98 mL/min — ABNORMAL LOW (ref >60.00)
Glucose, Bld: 92 mg/dL (ref 70–99)
Potassium: 3.6 mEq/L (ref 3.5–5.1)
Sodium: 141 mEq/L (ref 135–145)

## 2022-12-21 LAB — LIPID PANEL
Cholesterol: 163 mg/dL (ref 0–200)
HDL: 71.9 mg/dL (ref >39.00)
LDL Cholesterol: 67 mg/dL (ref 0–99)
NonHDL: 91.16
Total CHOL/HDL Ratio: 2
Triglycerides: 119 mg/dL (ref 0.0–149.0)
VLDL: 23.8 mg/dL (ref 0.0–40.0)

## 2022-12-21 LAB — TSH: TSH: 2.32 u[IU]/mL (ref 0.35–5.50)

## 2022-12-21 MED ORDER — HYDROCHLOROTHIAZIDE 12.5 MG PO TABS: 12.5000 mg | ORAL_TABLET | Freq: Every day | ORAL | 3 refills | Status: DC

## 2022-12-21 NOTE — Patient Instructions (Signed)
Schedule your complete physical in 6 months We'll notify you of your lab results and make any changes if needed Keep up the good work on healthy diet and regular exercise- you're doing great! We'll call you to schedule your nutrition appt and your Calcium CT Call with any questions or concerns Stay Safe!  Stay Healthy! Have an AMAZING trip!!!

## 2022-12-21 NOTE — Assessment & Plan Note (Signed)
Pt is down 7 lbs since March.  Applauded her efforts.  She is interested in working w/ a nutritionist to even further improve her diet.  Referral placed.

## 2022-12-21 NOTE — Progress Notes (Signed)
   Subjective:    Patient ID: Alexandra [name], female    DOB: 03-Apr-1951, 72 y.o.   MRN: [id number]  HPI HTN- chronic problem, on HCTZ 12.5mg  daily w/ good control.  No CP, SOB, HA's, visual changes, edema.  Hyperlipidemia- chronic problem, on Simvastatin 20mg  daily.  Pt is interested in coronary calcium score.  No abd pain, N/V.  Obesity- pt is down 7 lbs since March.  Pt reports she is logging her food and monitoring her intake.  Is interested in working w/ dietician/nutritionist.   Review of Systems For ROS see HPI     Objective:   Physical Exam Vitals reviewed.  Constitutional:      General: She is not in acute distress.    Appearance: Normal appearance. She is well-developed. She is obese. She is not ill-appearing.  HENT:     Head: Normocephalic and atraumatic.  Eyes:     Conjunctiva/sclera: Conjunctivae normal.     Pupils: Pupils are equal, round, and reactive to light.  Neck:     Thyroid: No thyromegaly.  Cardiovascular:     Rate and Rhythm: Normal rate and regular rhythm.     Heart sounds: Normal heart sounds. No murmur heard. Pulmonary:     Effort: Pulmonary effort is normal. No respiratory distress.     Breath sounds: Normal breath sounds.  Abdominal:     General: There is no distension.     Palpations: Abdomen is soft.     Tenderness: There is no abdominal tenderness.  Musculoskeletal:     Cervical back: Normal range of motion and neck supple.     Right lower leg: No edema.     Left lower leg: No edema.  Lymphadenopathy:     Cervical: No cervical adenopathy.  Skin:    General: Skin is warm and dry.  Neurological:     General: No focal deficit present.     Mental Status: She is alert and oriented to person, place, and time.  Psychiatric:        Mood and Affect: Mood normal.        Behavior: Behavior normal.        Thought Content: Thought content normal.          Assessment & Plan:

## 2022-12-21 NOTE — Assessment & Plan Note (Signed)
Chronic problem.  Currently well controlled on Simvastatin.  Pt is interested in coronary calcium score to see if medication needs to be adjusted.  Ordered.  Will follow.

## 2022-12-21 NOTE — Assessment & Plan Note (Signed)
Chronic problem.  On HCTZ 12.5mg  daily w/ good control.  Currently asymptomatic.  Check labs due to diuretic use but no anticipated med changes.

## 2022-12-22 ENCOUNTER — Encounter: Payer: Self-pay | Admitting: Family Medicine

## 2022-12-22 ENCOUNTER — Telehealth: Payer: Self-pay

## 2022-12-22 NOTE — Telephone Encounter (Signed)
Pt aware of lab results 

## 2022-12-22 NOTE — Telephone Encounter (Signed)
-----   Message from Sheliah Hatch, MD sent at 12/22/2022  7:25 AM EDT ----- Labs look great!  No changes at this time

## 2022-12-22 NOTE — Telephone Encounter (Signed)
I do not know the answers

## 2022-12-24 DIAGNOSIS — L570 Actinic keratosis: Secondary | ICD-10-CM | POA: Diagnosis not present

## 2022-12-24 DIAGNOSIS — L578 Other skin changes due to chronic exposure to nonionizing radiation: Secondary | ICD-10-CM | POA: Diagnosis not present

## 2022-12-24 DIAGNOSIS — L821 Other seborrheic keratosis: Secondary | ICD-10-CM | POA: Diagnosis not present

## 2022-12-24 DIAGNOSIS — L814 Other melanin hyperpigmentation: Secondary | ICD-10-CM | POA: Diagnosis not present

## 2022-12-24 DIAGNOSIS — D225 Melanocytic nevi of trunk: Secondary | ICD-10-CM | POA: Diagnosis not present

## 2023-01-05 ENCOUNTER — Encounter (INDEPENDENT_AMBULATORY_CARE_PROVIDER_SITE_OTHER): Payer: Medicare Other | Admitting: Family Medicine

## 2023-01-05 ENCOUNTER — Ambulatory Visit (HOSPITAL_BASED_OUTPATIENT_CLINIC_OR_DEPARTMENT_OTHER)
Admission: RE | Admit: 2023-01-05 | Discharge: 2023-01-05 | Disposition: A | Discharge status: Home / Self Care | Payer: Medicare Other | Source: Ambulatory Visit | Attending: Family Medicine | Admitting: Family Medicine

## 2023-01-05 DIAGNOSIS — E785 Hyperlipidemia, unspecified: Secondary | ICD-10-CM

## 2023-01-05 DIAGNOSIS — I10 Essential (primary) hypertension: Secondary | ICD-10-CM | POA: Insufficient documentation

## 2023-01-05 DIAGNOSIS — E669 Obesity, unspecified: Secondary | ICD-10-CM | POA: Insufficient documentation

## 2023-01-05 DIAGNOSIS — R931 Abnormal findings on diagnostic imaging of heart and coronary circulation: Secondary | ICD-10-CM

## 2023-01-06 ENCOUNTER — Encounter (HOSPITAL_BASED_OUTPATIENT_CLINIC_OR_DEPARTMENT_OTHER): Payer: Self-pay | Admitting: Pulmonary Disease

## 2023-01-06 ENCOUNTER — Ambulatory Visit (HOSPITAL_BASED_OUTPATIENT_CLINIC_OR_DEPARTMENT_OTHER): Payer: Medicare Other | Admitting: Pulmonary Disease

## 2023-01-06 ENCOUNTER — Other Ambulatory Visit: Payer: Self-pay

## 2023-01-06 ENCOUNTER — Telehealth: Payer: Self-pay

## 2023-01-06 VITALS — BP 124/70 | HR 65 | Ht 62.0 in | Wt 171.6 lb

## 2023-01-06 DIAGNOSIS — E785 Hyperlipidemia, unspecified: Secondary | ICD-10-CM

## 2023-01-06 DIAGNOSIS — G4733 Obstructive sleep apnea (adult) (pediatric): Secondary | ICD-10-CM | POA: Diagnosis not present

## 2023-01-06 MED ORDER — ATORVASTATIN CALCIUM 20 MG PO TABS
20.0000 mg | ORAL_TABLET | Freq: Every day | ORAL | 1 refills | Status: DC
Start: 2023-01-06 — End: 2023-01-07

## 2023-01-06 NOTE — Telephone Encounter (Signed)
Left results on pt VM ans she seen results Via my chart and Atorvastatin 20 mg has been sent in

## 2023-01-06 NOTE — Progress Notes (Signed)
[facility]  Chief Complaint  Patient presents with   Follow-up    Pt states she has been doing okay since last visit.    Past Surgical History:  She  has a past surgical history that includes Dermoid cyst  excision; Cataract extraction; Colonoscopy; and Elbow fracture surgery (2004).  Past Medical History:  Osteopenia, HTN, HLD, Glaucoma, Depression, Cataracts   Constitutional:  BP 124/70 (BP Location: Left Arm, Patient Position: Sitting, Cuff Size: Normal)   Pulse 65   Ht 5\' 2"  (1.575 m)   Wt 171 lb 9.6 oz (77.8 kg)   SpO2 97% Comment: RA  BMI 31.39 kg/m   Brief Summary:  Alexandra [name] is a 72 y.o. female with obstructive sleep apnea.      Subjective:   She got her new CPAP machine.  Surprised how much better she is sleeping since her new CPAP machine doesn't make as much noise compared to the old one.  No issues with mask fit.  She looks forward to sleeping now.  Physical Exam:   Appearance - well kempt   ENMT - no sinus tenderness, no oral exudate, no LAN, Mallampati 3 airway, no stridor  Respiratory - equal breath sounds bilaterally, no wheezing or rales  CV - s1s2 regular rate and rhythm, no murmurs  Ext - no clubbing, no edema  Skin - no rashes  Psych - normal mood and affect    Pulmonary testing:  RAST 10/02/21 >> negative, IgE 3 PFT 11/04/21 >> FEV1 2.28 (107%), FEV1% 80, TLC 4.85 (102%), DLCO 125%  Sleep Tests:  HST 10/02/17 >> AHI 8, SaO2 low 81%. Auto CPAP 12/06/22 to 01/04/23 >> used on 30 of 30 nights with average 7 hrs 26 min.  Average AHI 0.8 with median CPAP 7 and 95 th percentile CPAP 11 cm H2O  Social History:  She  reports that she has never smoked. She has never used smokeless tobacco. She reports current alcohol use of about 2.0 standard drinks of alcohol per week. She reports that she does not use drugs.  Family History:  Her family history includes Esophageal cancer in her maternal uncle;  Heart attack in her father; Hodgkin's lymphoma (age of onset: 17) in an other family member; Lung cancer in her maternal grandfather; Lung cancer (age of onset: 63) in her sister; Ovarian cancer (age of onset: 29) in her mother; Throat cancer (age of onset: 82) in her brother.     Assessment/Plan:   Obstructive sleep apnea. - she is compliant with CPAP and reports benefit from therapy - she uses [employer] for her DME - current CPAP ordered March 2024 - continue auto CPAP 5 to 15 cm H2O  Time Spent Involved in Patient Care on Day of Examination:  14 minutes  Follow up:   Patient Instructions  Follow up in 1 year  Medication List:   Allergies as of 01/06/2023   No Known Allergies      Medication List        Accurate as of Jan 06, 2023  2:03 PM. If you have any questions, ask your nurse or doctor.          acetaZOLAMIDE ER 500 MG capsule Commonly known as: DIAMOX Take 500 mg by mouth 2 (two) times daily.   atorvastatin 20 MG tablet Commonly known as: LIPITOR Take 1 tablet (20 mg total) by mouth daily. Started by: Rockney Ghee, CMA   brimonidine 0.2 % ophthalmic solution  Commonly known as: ALPHAGAN   CALCIUM 1200 PO Take by mouth.   cetirizine 10 MG tablet Commonly known as: ZYRTEC Take 1 tablet (10 mg total) by mouth daily.   dorzolamide-timolol 2-0.5 % ophthalmic solution Commonly known as: COSOPT   Glucosamine 1500 Complex Caps Take by mouth.   hydrochlorothiazide 12.5 MG tablet Commonly known as: HYDRODIURIL Take 1 tablet (12.5 mg total) by mouth daily.   Lumigan 0.01 % Soln Generic drug: bimatoprost   MULTIVITAMIN ADULT PO Take by mouth.   omeprazole 20 MG capsule Commonly known as: PRILOSEC Take 1 capsule by mouth once daily   simvastatin 20 MG tablet Commonly known as: ZOCOR TAKE 1 TABLET BY MOUTH AT  BEDTIME   SUPER B COMPLEX PO Take by mouth.   tretinoin 0.05 % cream Commonly known as: RETIN-A Apply topically at bedtime.    valACYclovir 1000 MG tablet Commonly known as: VALTREX SMARTSIG:2 Tablet(s) By Mouth   vitamin C 1000 MG tablet Take 1,000 mg by mouth daily.   vitamin E 180 MG (400 UNITS) capsule Take 400 Units by mouth daily.        Signature:  Coralyn Helling, MD [facility] Pulmonary/Critical Care Pager - [phone] 01/06/2023, 2:03 PM

## 2023-01-06 NOTE — Patient Instructions (Signed)
Follow up in 1 year.

## 2023-01-06 NOTE — Telephone Encounter (Signed)
-----   Message from Sheliah Hatch, MD sent at 01/05/2023  8:30 PM EDT ----- Your calcium score is 64.5 which places you in the 66th percentile.  You are already on Simvastatin but based on these results, we could increase the potency of your cholesterol medication and switch to Atorvastatin 20mg , #90, 1 refill (same milligrams, but more potent)

## 2023-01-07 MED ORDER — ATORVASTATIN CALCIUM 40 MG PO TABS
40.0000 mg | ORAL_TABLET | Freq: Every day | ORAL | 1 refills | Status: DC
Start: 2023-01-07 — End: 2023-01-26

## 2023-01-07 NOTE — Telephone Encounter (Signed)
[facility] VISIT   Patient agreed to [facility] visit and is aware that copayment and coinsurance may apply. Patient was treated using telemedicine according to accepted telemedicine protocols.  Subjective:   Patient complains of calcium score confusion  Patient Active Problem List   Diagnosis Date Noted   Cough variant asthma 11/04/2021   Allergic rhinitis 11/04/2021   Benign neoplasm of skin of lower limb 08/25/2021   Carcinoma in situ 08/25/2021   History of malignant neoplasm of skin 08/25/2021   History of neoplasm 08/25/2021   Melanocytic nevi of left upper limb, including shoulder 08/25/2021   Melanocytic nevi of trunk 08/25/2021   Obesity (BMI 30-39.9) 04/04/2019   Osteopenia 04/04/2019   OSA (obstructive sleep apnea) 10/05/2017   Family history of ovarian cancer 06/10/2015   Physical exam 03/31/2011   Hyperlipidemia 05/23/2008   GLAUCOMA NOS 05/02/2007   HTN (hypertension) 05/02/2007   Social History   Tobacco Use   Smoking status: Never   Smokeless tobacco: Never  Substance Use Topics   Alcohol use: Yes    Alcohol/week: 2.0 standard drinks of alcohol    Types: 2 Glasses of wine per week    Current Outpatient Medications:    atorvastatin (LIPITOR) 40 MG tablet, Take 1 tablet (40 mg total) by mouth daily., Disp: 90 tablet, Rfl: 1   acetaZOLAMIDE (DIAMOX) 500 MG capsule, Take 500 mg by mouth 2 (two) times daily., Disp: , Rfl:    Ascorbic Acid (VITAMIN C) 1000 MG tablet, Take 1,000 mg by mouth daily., Disp: , Rfl:    B Complex-C (SUPER B COMPLEX PO), Take by mouth., Disp: , Rfl:    brimonidine (ALPHAGAN) 0.2 % ophthalmic solution, , Disp: , Rfl: 3   Calcium Carbonate-Vit D-Min (CALCIUM 1200 PO), Take by mouth., Disp: , Rfl:    cetirizine (ZYRTEC) 10 MG tablet, Take 1 tablet (10 mg total) by mouth daily., Disp: 30 tablet, Rfl: 11   dorzolamide-timolol (COSOPT) 22.3-6.8 MG/ML ophthalmic solution, , Disp: , Rfl: 1   Glucosamine-Chondroit-Vit C-Mn (GLUCOSAMINE 1500 COMPLEX)  CAPS, Take by mouth., Disp: , Rfl:    hydrochlorothiazide (HYDRODIURIL) 12.5 MG tablet, Take 1 tablet (12.5 mg total) by mouth daily., Disp: 90 tablet, Rfl: 3   LUMIGAN 0.01 % SOLN, , Disp: , Rfl:    Multiple Vitamins-Minerals (MULTIVITAMIN ADULT PO), Take by mouth., Disp: , Rfl:    omeprazole (PRILOSEC) 20 MG capsule, Take 1 capsule by mouth once daily, Disp: 30 capsule, Rfl: 0   tretinoin (RETIN-A) 0.05 % cream, Apply topically at bedtime., Disp: , Rfl:    valACYclovir (VALTREX) 1000 MG tablet, SMARTSIG:2 Tablet(s) By Mouth, Disp: , Rfl:    vitamin E 400 UNIT capsule, Take 400 Units by mouth daily., Disp: , Rfl:   No Known Allergies  Assessment and Plan:   Diagnosis: Calcium score <100, Hyperlipidemia. Please see myChart communication and orders below.   No orders of the defined types were placed in this encounter.  Meds ordered this encounter  Medications   atorvastatin (LIPITOR) 40 MG tablet    Sig: Take 1 tablet (40 mg total) by mouth daily.    Dispense:  90 tablet    Refill:  1    Neena Rhymes, MD 01/07/2023  A total of 12 minutes were spent by me to personally review the patient-generated inquiry, review patient records and data pertinent to assessment of the patient's problem, develop a management plan including generation of prescriptions and/or orders, and on subsequent communication with the patient through secure the  MyChart portal service.   There is no separately reported E/M service related to this service in the past 7 days nor does the patient have an upcoming soonest available appointment for this issue. This work was completed in less than 7 days.   The patient consented to this service today (see patient agreement prior to ongoing communication). Patient counseled regarding the need for in-person exam for certain conditions and was advised to call the office if any changing or worsening symptoms occur.   The codes to be used for the E/M service are: []   99421  for 5-10 minutes of time spent on the inquiry. [x]   99422 for 11-20 minutes. []   [id number] for 21+ minutes.

## 2023-01-10 DIAGNOSIS — G4733 Obstructive sleep apnea (adult) (pediatric): Secondary | ICD-10-CM | POA: Diagnosis not present

## 2023-01-11 DIAGNOSIS — H0279 Other degenerative disorders of eyelid and periocular area: Secondary | ICD-10-CM | POA: Diagnosis not present

## 2023-01-11 DIAGNOSIS — H53483 Generalized contraction of visual field, bilateral: Secondary | ICD-10-CM | POA: Diagnosis not present

## 2023-01-11 DIAGNOSIS — H02413 Mechanical ptosis of bilateral eyelids: Secondary | ICD-10-CM | POA: Diagnosis not present

## 2023-01-11 DIAGNOSIS — H02423 Myogenic ptosis of bilateral eyelids: Secondary | ICD-10-CM | POA: Diagnosis not present

## 2023-01-11 DIAGNOSIS — H57813 Brow ptosis, bilateral: Secondary | ICD-10-CM | POA: Diagnosis not present

## 2023-01-18 DIAGNOSIS — T8131XA Disruption of external operation (surgical) wound, not elsewhere classified, initial encounter: Secondary | ICD-10-CM | POA: Diagnosis not present

## 2023-01-19 ENCOUNTER — Other Ambulatory Visit: Payer: Self-pay | Admitting: Family Medicine

## 2023-01-25 ENCOUNTER — Ambulatory Visit
Admission: RE | Admit: 2023-01-25 | Discharge: 2023-01-25 | Disposition: A | Discharge status: Home / Self Care | Payer: Medicare Other | Source: Ambulatory Visit | Attending: Family Medicine | Admitting: Family Medicine

## 2023-01-25 DIAGNOSIS — Z1231 Encounter for screening mammogram for malignant neoplasm of breast: Secondary | ICD-10-CM

## 2023-01-27 ENCOUNTER — Encounter: Payer: Self-pay | Admitting: Family Medicine

## 2023-01-27 ENCOUNTER — Ambulatory Visit (INDEPENDENT_AMBULATORY_CARE_PROVIDER_SITE_OTHER): Payer: Medicare Other | Admitting: Family Medicine

## 2023-01-27 VITALS — BP 124/80 | HR 71 | Temp 97.9°F | Resp 17 | Ht 62.0 in | Wt 172.1 lb

## 2023-01-27 DIAGNOSIS — E785 Hyperlipidemia, unspecified: Secondary | ICD-10-CM | POA: Diagnosis not present

## 2023-01-27 DIAGNOSIS — R931 Abnormal findings on diagnostic imaging of heart and coronary circulation: Secondary | ICD-10-CM | POA: Diagnosis not present

## 2023-01-27 MED ORDER — ATORVASTATIN CALCIUM 40 MG PO TABS: 40.0000 mg | ORAL_TABLET | Freq: Every day | ORAL | 1 refills | Status: DC

## 2023-01-27 NOTE — Patient Instructions (Signed)
Follow up as needed or as scheduled Continue the higher dose of Atorvastatin Keep taking the Aspirin 81mg  daily We'll call you to schedule your cardiology appt Keep up the good work!  You're doing great!!! Call with any questions or concerns Have a great summer!!!

## 2023-01-27 NOTE — Progress Notes (Unsigned)
   Subjective:    Patient ID: Alexandra [name], female    DOB: 1951/01/21, 72 y.o.   MRN: [id number]  HPI Calcium Score- pt reports she has additional questions.  Is very worried about results.   Review of Systems For ROS see HPI     Objective:   Physical Exam        Assessment & Plan:

## 2023-01-28 ENCOUNTER — Ambulatory Visit: Payer: Medicare Other

## 2023-02-05 DIAGNOSIS — H16213 Exposure keratoconjunctivitis, bilateral: Secondary | ICD-10-CM | POA: Diagnosis not present

## 2023-02-05 DIAGNOSIS — H0279 Other degenerative disorders of eyelid and periocular area: Secondary | ICD-10-CM | POA: Diagnosis not present

## 2023-02-05 DIAGNOSIS — H02224 Mechanical lagophthalmos left upper eyelid: Secondary | ICD-10-CM | POA: Diagnosis not present

## 2023-02-05 DIAGNOSIS — H02221 Mechanical lagophthalmos right upper eyelid: Secondary | ICD-10-CM | POA: Diagnosis not present

## 2023-02-05 DIAGNOSIS — H16223 Keratoconjunctivitis sicca, not specified as Sjogren's, bilateral: Secondary | ICD-10-CM | POA: Diagnosis not present

## 2023-02-09 DIAGNOSIS — G4733 Obstructive sleep apnea (adult) (pediatric): Secondary | ICD-10-CM | POA: Diagnosis not present

## 2023-02-24 ENCOUNTER — Ambulatory Visit: Payer: Medicare Other

## 2023-03-12 DIAGNOSIS — G4733 Obstructive sleep apnea (adult) (pediatric): Secondary | ICD-10-CM | POA: Diagnosis not present

## 2023-03-24 DIAGNOSIS — H401122 Primary open-angle glaucoma, left eye, moderate stage: Secondary | ICD-10-CM | POA: Diagnosis not present

## 2023-03-24 DIAGNOSIS — H401113 Primary open-angle glaucoma, right eye, severe stage: Secondary | ICD-10-CM | POA: Diagnosis not present

## 2023-03-24 DIAGNOSIS — Z961 Presence of intraocular lens: Secondary | ICD-10-CM | POA: Diagnosis not present

## 2023-03-24 DIAGNOSIS — H43813 Vitreous degeneration, bilateral: Secondary | ICD-10-CM | POA: Diagnosis not present

## 2023-03-24 DIAGNOSIS — H02423 Myogenic ptosis of bilateral eyelids: Secondary | ICD-10-CM | POA: Diagnosis not present

## 2023-03-24 DIAGNOSIS — H53032 Strabismic amblyopia, left eye: Secondary | ICD-10-CM | POA: Diagnosis not present

## 2023-04-02 DIAGNOSIS — H0279 Other degenerative disorders of eyelid and periocular area: Secondary | ICD-10-CM | POA: Diagnosis not present

## 2023-04-02 DIAGNOSIS — H0288B Meibomian gland dysfunction left eye, upper and lower eyelids: Secondary | ICD-10-CM | POA: Diagnosis not present

## 2023-04-02 DIAGNOSIS — H0102B Squamous blepharitis left eye, upper and lower eyelids: Secondary | ICD-10-CM | POA: Diagnosis not present

## 2023-04-02 DIAGNOSIS — H02224 Mechanical lagophthalmos left upper eyelid: Secondary | ICD-10-CM | POA: Diagnosis not present

## 2023-04-02 DIAGNOSIS — H02221 Mechanical lagophthalmos right upper eyelid: Secondary | ICD-10-CM | POA: Diagnosis not present

## 2023-04-02 DIAGNOSIS — H0288A Meibomian gland dysfunction right eye, upper and lower eyelids: Secondary | ICD-10-CM | POA: Diagnosis not present

## 2023-04-02 DIAGNOSIS — H16213 Exposure keratoconjunctivitis, bilateral: Secondary | ICD-10-CM | POA: Diagnosis not present

## 2023-04-02 DIAGNOSIS — H16223 Keratoconjunctivitis sicca, not specified as Sjogren's, bilateral: Secondary | ICD-10-CM | POA: Diagnosis not present

## 2023-04-02 DIAGNOSIS — H0102A Squamous blepharitis right eye, upper and lower eyelids: Secondary | ICD-10-CM | POA: Diagnosis not present

## 2023-04-05 DIAGNOSIS — H0279 Other degenerative disorders of eyelid and periocular area: Secondary | ICD-10-CM | POA: Diagnosis not present

## 2023-04-05 DIAGNOSIS — L905 Scar conditions and fibrosis of skin: Secondary | ICD-10-CM | POA: Diagnosis not present

## 2023-04-07 NOTE — Progress Notes (Signed)
Chief Complaint  Patient presents with   New Patient (Initial Visit)    CAD   History of Present Illness: 72 yo female with history of hyperlipidemia, HTN, CAD and sleep apnea here today as a new consult, referred by Dr. Beverely Low, for the evaluation of abnormal coronary calcium score. CT calcium score 64.5 in May 2024. She was started on ASA and Lipitor by Dr. Beverely Low. She tells me today that she is very active. She exercises every day including spin classes. She has no chest pain, dyspnea or palpitations. Excellent exercise tolerance. She has never smoked.   Primary Care Physician: Sheliah Hatch, MD   Past Medical History:  Diagnosis Date   CAD (coronary artery disease)    Cataract    REMOVED BOTH - LENS IMPLANTS   Depression    Family history of ovarian cancer    Glaucoma    Hyperlipidemia    Hypertension    OSA (obstructive sleep apnea) 10/05/2017   Osteopenia    Sleep apnea    WEARS CPAP    Past Surgical History:  Procedure Laterality Date   CATARACT EXTRACTION     11/04/17   COLONOSCOPY     DERMOID CYST  EXCISION     ELBOW FRACTURE SURGERY  2004    Current Outpatient Medications  Medication Sig Dispense Refill   acetaZOLAMIDE (DIAMOX) 500 MG capsule Take 500 mg by mouth 2 (two) times daily.     Ascorbic Acid (VITAMIN C) 1000 MG tablet Take 1,000 mg by mouth daily.     aspirin EC 81 MG tablet Take 81 mg by mouth daily. Swallow whole.     atorvastatin (LIPITOR) 40 MG tablet Take 1 tablet (40 mg total) by mouth daily. 90 tablet 1   B Complex-C (SUPER B COMPLEX PO) Take by mouth.     brimonidine (ALPHAGAN) 0.2 % ophthalmic solution   3   Calcium Carbonate-Vit D-Min (CALCIUM 1200 PO) Take by mouth.     cetirizine (ZYRTEC) 10 MG tablet Take 1 tablet (10 mg total) by mouth daily. 30 tablet 11   dorzolamide-timolol (COSOPT) 22.3-6.8 MG/ML ophthalmic solution   1   Glucosamine-Chondroit-Vit C-Mn (GLUCOSAMINE 1500 COMPLEX) CAPS Take by mouth.     hydrochlorothiazide  (HYDRODIURIL) 12.5 MG tablet TAKE 1 TABLET BY MOUTH DAILY 100 tablet 2   LUMIGAN 0.01 % SOLN      Multiple Vitamins-Minerals (MULTIVITAMIN ADULT PO) Take by mouth.     omeprazole (PRILOSEC) 20 MG capsule Take 1 capsule by mouth once daily 30 capsule 0   tretinoin (RETIN-A) 0.05 % cream Apply topically at bedtime.     valACYclovir (VALTREX) 1000 MG tablet SMARTSIG:2 Tablet(s) By Mouth     vitamin E 400 UNIT capsule Take 400 Units by mouth daily.     VITAMIN K PO Take by mouth.     No current facility-administered medications for this visit.    No Known Allergies  Social History   Socioeconomic History   Marital status: Married    Spouse name: Not on file   Number of children: 2   Years of education: Not on file   Highest education level: Associate degree: academic program  Occupational History   Occupation: retired   Occupation: Research scientist (medical)  Tobacco Use   Smoking status: Never   Smokeless tobacco: Never  Vaping Use   Vaping status: Never Used  Substance and Sexual Activity   Alcohol use: Yes    Alcohol/week: 2.0 standard drinks of alcohol  Types: 2 Glasses of wine per week    Comment: 2-3 glasses of wine  per month   Drug use: No   Sexual activity: Not on file  Other Topics Concern   Not on file  Social History Narrative   Not on file   Social Determinants of Health   Financial Resource Strain: Low Risk  (12/17/2022)   Overall Financial Resource Strain (CARDIA)    Difficulty of Paying Living Expenses: Not hard at all  Food Insecurity: No Food Insecurity (12/17/2022)   Hunger Vital Sign    Worried About Running Out of Food in the Last Year: Never true    Ran Out of Food in the Last Year: Never true  Transportation Needs: No Transportation Needs (12/17/2022)   PRAPARE - Administrator, Civil Service (Medical): No    Lack of Transportation (Non-Medical): No  Physical Activity: Sufficiently Active (12/17/2022)   Exercise Vital Sign    Days of  Exercise per Week: 7 days    Minutes of Exercise per Session: 50 min  Stress: No Stress Concern Present (12/17/2022)   [employer] of Occupational Health - Occupational Stress Questionnaire    Feeling of Stress : Not at all  Social Connections: Socially Integrated (12/17/2022)   Social Connection and Isolation Panel [NHANES]    Frequency of Communication with Friends and Family: More than three times a week    Frequency of Social Gatherings with Friends and Family: More than three times a week    Attends Religious Services: More than 4 times per year    Active Member of [employer] or Organizations: Yes    Attends Engineer, structural: More than 4 times per year    Marital Status: Married  Catering manager Violence: Not At Risk (12/17/2022)   Humiliation, Afraid, Rape, and Kick questionnaire    Fear of Current or Ex-Partner: No    Emotionally Abused: No    Physically Abused: No    Sexually Abused: No    Family History  Problem Relation Age of Onset   Ovarian cancer Mother 79       Heart attack at age 40   Heart attack Father        Heart attack at age 31   Lung cancer Sister 17       smoker   Throat cancer Brother 1       smoker   Lung cancer Maternal Grandfather        dx in his 62s   Esophageal cancer Maternal Uncle    Hodgkin's lymphoma Other 16   Colon cancer Neg Hx    Colon polyps Neg Hx    Rectal cancer Neg Hx    Stomach cancer Neg Hx     Review of Systems:  As stated in the HPI and otherwise negative.   BP 122/76   Pulse 65   Ht 5\' 2"  (1.575 m)   Wt 76.6 kg   SpO2 95%   BMI 30.87 kg/m   Physical Examination: General: Well developed, well nourished, NAD  HEENT: OP clear, mucus membranes moist  SKIN: warm, dry. No rashes. Neuro: No focal deficits  Musculoskeletal: Muscle strength 5/5 all ext  Psychiatric: Mood and affect normal  Neck: No JVD, no carotid bruits, no thyromegaly, no lymphadenopathy.  Lungs:Clear bilaterally, no wheezes, rhonci,  crackles Cardiovascular: Regular rate and rhythm. No murmurs, gallops or rubs. Abdomen:Soft. Bowel sounds present. Non-tender.  Extremities: No lower extremity edema. Pulses are 2 + in  the bilateral DP/PT.  EKG:  EKG is ordered today. The ekg ordered today demonstrates  EKG Interpretation Date/Time:  Thursday April 08 2023 12:58:51 EDT Ventricular Rate:  65 PR Interval:  168 QRS Duration:  98 QT Interval:  388 QTC Calculation: 403 R Axis:   -54  Text Interpretation: Normal sinus rhythm Left axis deviation Incomplete right bundle branch block No previous ECGs available Confirmed by Verne Carrow [phone]) on 04/08/2023 1:15:18 PM   Recent Labs: 12/21/2022: ALT 13; BUN 16; Creatinine, Ser 0.96; Hemoglobin 13.4; Platelets 274.0; Potassium 3.6; Sodium 141; TSH 2.32   Lipid Panel    Component Value Date/Time   CHOL 163 12/21/2022 1139   TRIG 119.0 12/21/2022 1139   HDL 71.90 12/21/2022 1139   CHOLHDL 2 12/21/2022 1139   VLDL 23.8 12/21/2022 1139   LDLCALC 67 12/21/2022 1139   LDLDIRECT 108.0 08/11/2017 1033     Wt Readings from Last 3 Encounters:  04/08/23 76.6 kg  01/27/23 78.1 kg  01/06/23 77.8 kg    Assessment and Plan:   1. CAD without angina: She has evidence of CAD with abnormal coronary calcium score. She has no symptoms that are suggestive of angina. I would recommend at this time that she continue with aggressive risk factor modification. Continue ASA and statin. No indication for ischemic evaluation at this time.   Labs/ tests ordered today include:   Orders Placed This Encounter  Procedures   EKG 12-Lead   Disposition:   F/U with me in 12 months   Signed, Verne Carrow, MD, [facility] 04/08/2023 1:19 PM    [facility] [address] [place], [place], [place]  [address] Phone: [phone]; Fax: [phone]

## 2023-04-08 ENCOUNTER — Ambulatory Visit: Payer: Medicare Other | Attending: Cardiovascular Disease | Admitting: Cardiovascular Disease

## 2023-04-08 ENCOUNTER — Encounter: Payer: Self-pay | Admitting: Cardiovascular Disease

## 2023-04-08 VITALS — BP 122/76 | HR 65 | Ht 62.0 in | Wt 168.8 lb

## 2023-04-08 DIAGNOSIS — I2510 Atherosclerotic heart disease of native coronary artery without angina pectoris: Secondary | ICD-10-CM | POA: Diagnosis not present

## 2023-04-08 NOTE — Patient Instructions (Signed)
Medication Instructions:  Your physician recommends that you continue on your current medications as directed. Please refer to the Current Medication list given to you today.  *If you need a refill on your cardiac medications before your next appointment, please call your pharmacy*   Follow-Up: At [facility], you and your health needs are our priority.  As part of our continuing mission to provide you with exceptional heart care, we have created designated Provider Care Teams.  These Care Teams include your primary Cardiologist (physician) and Advanced Practice Providers (APPs -  Physician Assistants and Nurse Practitioners) who all work together to provide you with the care you need, when you need it.   Your next appointment:   1 year(s)  Provider:   Dr McAlhany   

## 2023-05-12 ENCOUNTER — Other Ambulatory Visit: Payer: Self-pay

## 2023-05-12 DIAGNOSIS — E785 Hyperlipidemia, unspecified: Secondary | ICD-10-CM

## 2023-05-12 MED ORDER — ATORVASTATIN CALCIUM 40 MG PO TABS
40.0000 mg | ORAL_TABLET | Freq: Every day | ORAL | 1 refills | Status: DC
Start: 2023-05-12 — End: 2023-08-16

## 2023-05-12 NOTE — Telephone Encounter (Signed)
Received fax from patient's pharmacy.   Medication: Atorvastatin 40 mg  Directions: Take 1 tablet by mouth daily  Last given: 01/27/23 Number refills: 1 Last o/v: 01/27/23 Follow up: 07/06/23 (physical) Labs: 12/21/22

## 2023-05-20 ENCOUNTER — Telehealth: Payer: Self-pay | Admitting: Family Medicine

## 2023-05-20 NOTE — Telephone Encounter (Signed)
Placed in your sign folder  

## 2023-05-20 NOTE — Telephone Encounter (Signed)
Received forms from [employer] Printed & placed in provider bin

## 2023-05-21 NOTE — Telephone Encounter (Signed)
Faxed placed in faxed folder at the station

## 2023-05-21 NOTE — Telephone Encounter (Signed)
Signed and returned to [facility] to fax

## 2023-06-29 ENCOUNTER — Other Ambulatory Visit: Payer: Self-pay | Admitting: Medical Genetics

## 2023-06-29 DIAGNOSIS — Z006 Encounter for examination for normal comparison and control in clinical research program: Secondary | ICD-10-CM

## 2023-07-01 DIAGNOSIS — L578 Other skin changes due to chronic exposure to nonionizing radiation: Secondary | ICD-10-CM | POA: Diagnosis not present

## 2023-07-01 DIAGNOSIS — D0462 Carcinoma in situ of skin of left upper limb, including shoulder: Secondary | ICD-10-CM | POA: Diagnosis not present

## 2023-07-01 DIAGNOSIS — D225 Melanocytic nevi of trunk: Secondary | ICD-10-CM | POA: Diagnosis not present

## 2023-07-01 DIAGNOSIS — L570 Actinic keratosis: Secondary | ICD-10-CM | POA: Diagnosis not present

## 2023-07-01 DIAGNOSIS — L814 Other melanin hyperpigmentation: Secondary | ICD-10-CM | POA: Diagnosis not present

## 2023-07-01 DIAGNOSIS — L821 Other seborrheic keratosis: Secondary | ICD-10-CM | POA: Diagnosis not present

## 2023-07-01 DIAGNOSIS — D485 Neoplasm of uncertain behavior of skin: Secondary | ICD-10-CM | POA: Diagnosis not present

## 2023-07-06 ENCOUNTER — Encounter: Payer: Self-pay | Admitting: Family Medicine

## 2023-07-06 ENCOUNTER — Ambulatory Visit (INDEPENDENT_AMBULATORY_CARE_PROVIDER_SITE_OTHER): Payer: Medicare Other | Admitting: Family Medicine

## 2023-07-06 VITALS — BP 110/68 | HR 67 | Temp 97.8°F | Ht 63.0 in | Wt 171.5 lb

## 2023-07-06 DIAGNOSIS — E785 Hyperlipidemia, unspecified: Secondary | ICD-10-CM | POA: Diagnosis not present

## 2023-07-06 DIAGNOSIS — Z0000 Encounter for general adult medical examination without abnormal findings: Secondary | ICD-10-CM

## 2023-07-06 NOTE — Patient Instructions (Signed)
Follow up in 6 months to recheck cholesterol We'll notify you of your lab results and make any changes if needed Keep up the good work on healthy diet and regular exercise- you look great!! If no improvement in the vaginal irritation by early next week- let me know! Call with any questions or concerns Stay Safe!  Stay Healthy! Happy Thanksgiving!!

## 2023-07-06 NOTE — Assessment & Plan Note (Signed)
Chronic problem.  Currently tolerating statin w/o difficulty.  Check labs.  Adjust meds prn

## 2023-07-06 NOTE — Assessment & Plan Note (Signed)
Pt's PE WNL.  UTD on DEXA, mammo, colonoscopy, Tdap, PNA, flu.  Check labs.  Anticipatory guidance provided.

## 2023-07-06 NOTE — Progress Notes (Signed)
   Subjective:    Patient ID: Alexandra [name], female    DOB: 1951/08/07, 72 y.o.   MRN: [id number]  HPI CPE- UTD on DEXA, mammo, colonoscopy, Tdap, PNA, flu  Patient Care Team    Relationship Specialty Notifications Start End  Sheliah Hatch, MD PCP - General   07/23/10   Dahlia Byes, [facility] (Inactive) Pharmacist Pharmacist  11/22/19    Comment: PHONE NUMBER [phone]    Health Maintenance  Topic Date Due   COVID-19 Vaccine (8 - 2023-24 season) 07/09/2023   DEXA SCAN  12/03/2023   Medicare Annual Wellness (AWV)  12/17/2023   MAMMOGRAM  01/24/2025   Colonoscopy  07/13/2026   DTaP/Tdap/Td (3 - Td or Tdap) 12/16/2026   Pneumonia Vaccine 24+ Years old  Completed   INFLUENZA VACCINE  Completed   Hepatitis C Screening  Completed   Zoster Vaccines- Shingrix  Completed   HPV VACCINES  Aged Out      Review of Systems Patient reports no vision/ hearing changes, adenopathy,fever, weight change,  persistant/recurrent hoarseness , swallowing issues, chest pain, palpitations, edema, persistant/recurrent cough, hemoptysis, dyspnea (rest/exertional/paroxysmal nocturnal), gastrointestinal bleeding (melena, rectal bleeding), abdominal pain, significant heartburn, bowel changes, GU symptoms (dysuria, hematuria, incontinence),  syncope, focal weakness, memory loss, numbness & tingling, skin/hair/nail changes, abnormal bruising or bleeding, anxiety, or depression.   + vaginal irritation- using OTC product (Vagistat) w/ some relief.  Denies itching or burning.  Sxs started 'a couple of weeks' ago.  Isolated to labia    Objective:   Physical Exam General Appearance:    Alert, cooperative, no distress, appears stated age  Head:    Normocephalic, without obvious abnormality, atraumatic  Eyes:    PERRL, conjunctiva/corneas clear, EOM's intact both eyes  Ears:    Normal TM's and external ear canals, both ears  Nose:   Nares normal, septum midline, mucosa normal, no drainage    or sinus tenderness   Throat:   Lips, mucosa, and tongue normal; teeth and gums normal  Neck:   Supple, symmetrical, trachea midline, no adenopathy;    Thyroid: no enlargement/tenderness/nodules  Back:     Symmetric, no curvature, ROM normal, no CVA tenderness  Lungs:     Clear to auscultation bilaterally, respirations unlabored  Chest Wall:    No tenderness or deformity   Heart:    Regular rate and rhythm, S1 and S2 normal, no murmur, rub   or gallop  Breast Exam:    Deferred to mammo  Abdomen:     Soft, non-tender, bowel sounds active all four quadrants,    no masses, no organomegaly  Genitalia:    Deferred  Rectal:    Extremities:   Extremities normal, atraumatic, no cyanosis or edema  Pulses:   2+ and symmetric all extremities  Skin:   Skin color, texture, turgor normal, no rashes or lesions  Lymph nodes:   Cervical, supraclavicular, and axillary nodes normal  Neurologic:   CNII-XII intact, normal strength, sensation and reflexes    throughout          Assessment & Plan:

## 2023-07-07 ENCOUNTER — Encounter: Payer: Self-pay | Admitting: Family Medicine

## 2023-07-07 DIAGNOSIS — R7309 Other abnormal glucose: Secondary | ICD-10-CM

## 2023-07-07 DIAGNOSIS — E785 Hyperlipidemia, unspecified: Secondary | ICD-10-CM

## 2023-07-07 LAB — CBC WITH DIFFERENTIAL/PLATELET
Basophils Absolute: 0 10*3/uL (ref 0.0–0.1)
Basophils Relative: 0.6 % (ref 0.0–3.0)
Eosinophils Absolute: 0.2 10*3/uL (ref 0.0–0.7)
Eosinophils Relative: 4.5 % (ref 0.0–5.0)
HCT: 45.3 % (ref 36.0–46.0)
Hemoglobin: 14.9 g/dL (ref 12.0–15.0)
Lymphocytes Relative: 26.8 % (ref 12.0–46.0)
Lymphs Abs: 1.4 10*3/uL (ref 0.7–4.0)
MCHC: 32.9 g/dL (ref 30.0–36.0)
MCV: 89.4 fL (ref 78.0–100.0)
Monocytes Absolute: 0.4 10*3/uL (ref 0.1–1.0)
Monocytes Relative: 8 % (ref 3.0–12.0)
Neutro Abs: 3.1 10*3/uL (ref 1.4–7.7)
Neutrophils Relative %: 60.1 % (ref 43.0–77.0)
Platelets: 271 10*3/uL (ref 150.0–400.0)
RBC: 5.07 Mil/uL (ref 3.87–5.11)
RDW: 13.8 % (ref 11.5–15.5)
WBC: 5.2 10*3/uL (ref 4.0–10.5)

## 2023-07-07 LAB — LIPID PANEL
Cholesterol: 144 mg/dL (ref 0–200)
HDL: 64.9 mg/dL (ref >39.00)
LDL Cholesterol: 53 mg/dL (ref 0–99)
NonHDL: 79.24
Total CHOL/HDL Ratio: 2
Triglycerides: 131 mg/dL (ref 0.0–149.0)
VLDL: 26.2 mg/dL (ref 0.0–40.0)

## 2023-07-07 LAB — HEPATIC FUNCTION PANEL
ALT: 19 U/L (ref 0–35)
AST: 24 U/L (ref 0–37)
Albumin: 4.2 g/dL (ref 3.5–5.2)
Alkaline Phosphatase: 48 U/L (ref 39–117)
Bilirubin, Direct: 0.2 mg/dL (ref 0.0–0.3)
Total Bilirubin: 1.5 mg/dL — ABNORMAL HIGH (ref 0.2–1.2)
Total Protein: 6.7 g/dL (ref 6.0–8.3)

## 2023-07-07 LAB — BASIC METABOLIC PANEL
BUN: 21 mg/dL (ref 6–23)
CO2: 26 meq/L (ref 19–32)
Calcium: 9.3 mg/dL (ref 8.4–10.5)
Chloride: 107 meq/L (ref 96–112)
Creatinine, Ser: 1 mg/dL (ref 0.40–1.20)
GFR: 56.9 mL/min — ABNORMAL LOW (ref >60.00)
Glucose, Bld: 107 mg/dL — ABNORMAL HIGH (ref 70–99)
Potassium: 3.6 meq/L (ref 3.5–5.1)
Sodium: 141 meq/L (ref 135–145)

## 2023-07-07 LAB — TSH: TSH: 2.23 u[IU]/mL (ref 0.35–5.50)

## 2023-07-07 NOTE — Progress Notes (Signed)
Patient viewed labs on mychart, reached out to ask if she had any questions.

## 2023-07-12 NOTE — Addendum Note (Signed)
Addended by: Sheliah Hatch on: 07/12/2023 03:58 PM   Modules accepted: Orders

## 2023-07-13 MED ORDER — NYSTATIN 100000 UNIT/GM EX CREA: 1.0000 | TOPICAL_CREAM | Freq: Two times a day (BID) | CUTANEOUS | 0 refills | Status: DC

## 2023-07-13 NOTE — Addendum Note (Signed)
Addended by: Sheliah Hatch on: 07/13/2023 07:35 PM   Modules accepted: Orders

## 2023-07-15 ENCOUNTER — Other Ambulatory Visit: Payer: Medicare Other

## 2023-07-16 ENCOUNTER — Telehealth: Payer: Self-pay

## 2023-07-16 ENCOUNTER — Other Ambulatory Visit (INDEPENDENT_AMBULATORY_CARE_PROVIDER_SITE_OTHER): Payer: Medicare Other

## 2023-07-16 DIAGNOSIS — R7309 Other abnormal glucose: Secondary | ICD-10-CM

## 2023-07-16 DIAGNOSIS — E785 Hyperlipidemia, unspecified: Secondary | ICD-10-CM

## 2023-07-16 LAB — HEMOGLOBIN A1C: Hgb A1c MFr Bld: 6.1 % (ref 4.6–6.5)

## 2023-07-16 NOTE — Telephone Encounter (Signed)
Pt has reviewed labs via MyChart

## 2023-07-16 NOTE — Telephone Encounter (Signed)
-----   Message from Neena Rhymes sent at 07/16/2023  3:27 PM EST ----- A1C is just in the pre-diabetes range (greater than 6).  Continue to work on healthy diet and regular exercise.  Advanced lipid panel pending

## 2023-07-20 ENCOUNTER — Other Ambulatory Visit: Payer: Self-pay | Admitting: Family Medicine

## 2023-07-20 DIAGNOSIS — E785 Hyperlipidemia, unspecified: Secondary | ICD-10-CM

## 2023-07-21 LAB — CARDIO IQ(R) ADVANCED LIPID PANEL
Apolipoprotein B: 54 mg/dL (ref <90)
Cholesterol: 133 mg/dL (ref <200)
HDL LARGE: 6861 nmol/L (ref >6729)
HDL: 75 mg/dL (ref >49)
LDL Cholesterol (Calc): 40 mg/dL (ref <100)
LDL Medium: 148 nmol/L (ref <215)
LDL Particle Number: 1123 nmol/L (ref <1138)
LDL Peak Size: 209.7 Angstrom — ABNORMAL LOW (ref >222.9)
LDL Small: 177 nmol/L — ABNORMAL HIGH (ref <142)
Lipoprotein (a): <10 nmol/L (ref <75)
Non-HDL Cholesterol (Calc): 58 mg/dL (ref <130)
Total CHOL/HDL Ratio: 1.8 calc (ref <5.0)
Triglycerides: 94 mg/dL (ref <150)

## 2023-07-27 ENCOUNTER — Encounter: Payer: Self-pay | Admitting: Family Medicine

## 2023-07-29 ENCOUNTER — Other Ambulatory Visit (HOSPITAL_COMMUNITY)
Admission: RE | Admit: 2023-07-29 | Discharge: 2023-07-29 | Disposition: A | Discharge status: Home / Self Care | Payer: Medicare Other | Source: Ambulatory Visit | Attending: Medical Genetics | Admitting: Medical Genetics

## 2023-07-29 DIAGNOSIS — Z006 Encounter for examination for normal comparison and control in clinical research program: Secondary | ICD-10-CM

## 2023-07-30 ENCOUNTER — Telehealth: Payer: Self-pay | Admitting: Family Medicine

## 2023-07-30 NOTE — Telephone Encounter (Signed)
Type of form received:[facility]   Additional comments:   Received ZO:[id number] - Front Desk   Form should be Faxed/mailed to: N/A  Is patient requesting call for pickup:N/A  Form placed: [employer] charge sheet.  Provider will determine charge.N/A  Individual made aware of 3-5 business day turn around No?

## 2023-07-30 NOTE — Telephone Encounter (Signed)
Wrong MRN - label was placed on these. Lenard Simmer has these.

## 2023-08-02 NOTE — Telephone Encounter (Signed)
These have been presented to [facility], they have been relabeled and placed in Dr. Rennis Golden bin. Archie Patten has retrieved them.

## 2023-08-09 LAB — GENECONNECT MOLECULAR SCREEN: Genetic Analysis Overall Interpretation: NEGATIVE

## 2023-08-15 ENCOUNTER — Other Ambulatory Visit: Payer: Self-pay | Admitting: Family Medicine

## 2023-08-15 DIAGNOSIS — E785 Hyperlipidemia, unspecified: Secondary | ICD-10-CM

## 2023-08-16 ENCOUNTER — Other Ambulatory Visit: Payer: Self-pay | Admitting: Family Medicine

## 2023-08-16 DIAGNOSIS — E785 Hyperlipidemia, unspecified: Secondary | ICD-10-CM

## 2023-08-16 MED ORDER — ATORVASTATIN CALCIUM 40 MG PO TABS
40.0000 mg | ORAL_TABLET | Freq: Every day | ORAL | 1 refills | Status: DC
Start: 2023-08-16 — End: 2024-01-28

## 2023-08-16 NOTE — Telephone Encounter (Signed)
 Copied from CRM [id number]. Topic: Clinical - Medication Refill >> Aug 16, 2023 10:37 AM Evie NOVAK wrote: Most Recent Primary Care Visit:  Provider: [facility]  [facility]  Visit Type: LAB  Date: 07/16/2023  Medication: atorvastatin  (LIPITOR) 40 MG table   Has the patient contacted their pharmacy? Yes (Agent: If no, request that the patient contact the pharmacy for the refill. If patient does not wish to contact the pharmacy document the reason why and proceed with request.) (Agent: If yes, when and what did the pharmacy advise?)  Is this the correct pharmacy for this prescription? Yes If no, delete pharmacy and type the correct one.  This is the patient's preferred pharmacy:  [facility] [address] [place], [place] - [address]   [facility] [place], [place] - [address] [facility] [address] [place] [address] [place] [address] Phone: [phone] Fax: [phone]     Has the prescription been filled recently? Yes  Is the patient out of the medication? Yes  Has the patient been seen for an appointment in the last year OR does the patient have an upcoming appointment? Yes  Can we respond through MyChart? Yes  Agent: Please be advised that Rx refills may take up to 3 business days. We ask that you follow-up with your pharmacy.

## 2023-08-17 DIAGNOSIS — H401113 Primary open-angle glaucoma, right eye, severe stage: Secondary | ICD-10-CM | POA: Diagnosis not present

## 2023-08-17 DIAGNOSIS — H401122 Primary open-angle glaucoma, left eye, moderate stage: Secondary | ICD-10-CM | POA: Diagnosis not present

## 2023-08-17 DIAGNOSIS — H53032 Strabismic amblyopia, left eye: Secondary | ICD-10-CM | POA: Diagnosis not present

## 2023-08-17 DIAGNOSIS — Z961 Presence of intraocular lens: Secondary | ICD-10-CM | POA: Diagnosis not present

## 2023-08-17 DIAGNOSIS — H43813 Vitreous degeneration, bilateral: Secondary | ICD-10-CM | POA: Diagnosis not present

## 2023-08-17 DIAGNOSIS — H02423 Myogenic ptosis of bilateral eyelids: Secondary | ICD-10-CM | POA: Diagnosis not present

## 2023-09-03 DIAGNOSIS — H0288B Meibomian gland dysfunction left eye, upper and lower eyelids: Secondary | ICD-10-CM | POA: Diagnosis not present

## 2023-09-03 DIAGNOSIS — H0102B Squamous blepharitis left eye, upper and lower eyelids: Secondary | ICD-10-CM | POA: Diagnosis not present

## 2023-09-03 DIAGNOSIS — H02224 Mechanical lagophthalmos left upper eyelid: Secondary | ICD-10-CM | POA: Diagnosis not present

## 2023-09-03 DIAGNOSIS — H0279 Other degenerative disorders of eyelid and periocular area: Secondary | ICD-10-CM | POA: Diagnosis not present

## 2023-09-03 DIAGNOSIS — H16213 Exposure keratoconjunctivitis, bilateral: Secondary | ICD-10-CM | POA: Diagnosis not present

## 2023-09-03 DIAGNOSIS — H16223 Keratoconjunctivitis sicca, not specified as Sjogren's, bilateral: Secondary | ICD-10-CM | POA: Diagnosis not present

## 2023-09-03 DIAGNOSIS — H0102A Squamous blepharitis right eye, upper and lower eyelids: Secondary | ICD-10-CM | POA: Diagnosis not present

## 2023-09-03 DIAGNOSIS — H0288A Meibomian gland dysfunction right eye, upper and lower eyelids: Secondary | ICD-10-CM | POA: Diagnosis not present

## 2023-09-03 DIAGNOSIS — H02221 Mechanical lagophthalmos right upper eyelid: Secondary | ICD-10-CM | POA: Diagnosis not present

## 2023-09-06 ENCOUNTER — Telehealth: Payer: Self-pay | Admitting: Adult Health

## 2023-09-06 DIAGNOSIS — G4733 Obstructive sleep apnea (adult) (pediatric): Secondary | ICD-10-CM

## 2023-09-06 NOTE — Telephone Encounter (Addendum)
[facility] is not a DME it is a Consulting civil engineer who obtains prior authorizations for pts DME supplies.  [facility] will need to be pt's DME per pt's insurance plan.  Please place a new order for pt's cpap supplies & I will forward new order to [facility]. Pt has been made aware.

## 2023-09-06 NOTE — Telephone Encounter (Signed)
PT no longer uses [facility] and is with [facility]. She said [facility] needs the RX, Face to face notes &  sleep study.   Fax is [phone]

## 2023-09-08 NOTE — Telephone Encounter (Signed)
Order has been placed for [facility].

## 2023-10-04 ENCOUNTER — Other Ambulatory Visit: Payer: Self-pay | Admitting: Adult Health

## 2023-10-07 ENCOUNTER — Telehealth: Payer: Self-pay | Admitting: Adult Health

## 2023-10-07 NOTE — Telephone Encounter (Signed)
 Patient needs a refill of Benzantate cough capsule.  Pharmacy: [facility] in [place]

## 2023-10-08 DIAGNOSIS — D485 Neoplasm of uncertain behavior of skin: Secondary | ICD-10-CM | POA: Diagnosis not present

## 2023-10-08 DIAGNOSIS — D0462 Carcinoma in situ of skin of left upper limb, including shoulder: Secondary | ICD-10-CM | POA: Diagnosis not present

## 2023-10-08 DIAGNOSIS — L430 Hypertrophic lichen planus: Secondary | ICD-10-CM | POA: Diagnosis not present

## 2023-10-10 ENCOUNTER — Ambulatory Visit (INDEPENDENT_AMBULATORY_CARE_PROVIDER_SITE_OTHER)

## 2023-10-10 ENCOUNTER — Ambulatory Visit
Admission: RE | Admit: 2023-10-10 | Discharge: 2023-10-10 | Disposition: A | Discharge status: Home / Self Care | Source: Ambulatory Visit | Attending: Family Medicine | Admitting: Family Medicine

## 2023-10-10 VITALS — BP 124/77 | HR 78 | Temp 98.9°F | Resp 18 | Ht 63.0 in | Wt 172.0 lb

## 2023-10-10 DIAGNOSIS — R059 Cough, unspecified: Secondary | ICD-10-CM | POA: Diagnosis not present

## 2023-10-10 DIAGNOSIS — R053 Chronic cough: Secondary | ICD-10-CM

## 2023-10-10 DIAGNOSIS — J45998 Other asthma: Secondary | ICD-10-CM

## 2023-10-10 MED ORDER — PREDNISONE 20 MG PO TABS: ORAL_TABLET | ORAL | 0 refills | Status: DC

## 2023-10-10 MED ORDER — PROMETHAZINE-DM 6.25-15 MG/5ML PO SYRP: 5.0000 mL | ORAL_SOLUTION | Freq: Three times a day (TID) | ORAL | 0 refills | Status: AC | PRN

## 2023-10-10 MED ORDER — BENZONATATE 100 MG PO CAPS: 100.0000 mg | ORAL_CAPSULE | Freq: Three times a day (TID) | ORAL | 0 refills | Status: AC | PRN

## 2023-10-10 MED ORDER — ALBUTEROL SULFATE HFA 108 (90 BASE) MCG/ACT IN AERS: 1.0000 | INHALATION_SPRAY | Freq: Four times a day (QID) | RESPIRATORY_TRACT | 0 refills | Status: DC | PRN

## 2023-10-10 NOTE — ED Provider Notes (Signed)
 [facility]  Note:  This document was prepared using Conservation officer, historic buildings and may include unintentional dictation errors.  MRN: [id number] DOB: 12/13/1950  Subjective:   Alexandra [name] is a 73 y.o. female presenting for 1 month history of persistent coughing, chest tightness and intermittent wheezing.  Had influenza a month ago and her cough has been going on since then.  However, she has been seen by pulmonary to thought to have seasonal asthma, reactive airway.   No smoking of any kind including cigarettes, cigars, vaping, marijuana use.    No current facility-administered medications for this encounter.  Current Outpatient Medications:    acetaZOLAMIDE (DIAMOX) 500 MG capsule, Take 500 mg by mouth 2 (two) times daily., Disp: , Rfl:    Ascorbic Acid (VITAMIN C) 1000 MG tablet, Take 1,000 mg by mouth daily., Disp: , Rfl:    aspirin EC 81 MG tablet, Take 81 mg by mouth daily. Swallow whole., Disp: , Rfl:    atorvastatin (LIPITOR) 40 MG tablet, Take 1 tablet (40 mg total) by mouth daily., Disp: 90 tablet, Rfl: 1   B Complex-C (SUPER B COMPLEX PO), Take by mouth., Disp: , Rfl:    brimonidine (ALPHAGAN) 0.2 % ophthalmic solution, , Disp: , Rfl: 3   Calcium Carbonate-Vit D-Min (CALCIUM 1200 PO), Take by mouth., Disp: , Rfl:    cetirizine (ZYRTEC) 10 MG tablet, Take 1 tablet (10 mg total) by mouth daily., Disp: 30 tablet, Rfl: 11   dorzolamide-timolol (COSOPT) 22.3-6.8 MG/ML ophthalmic solution, , Disp: , Rfl: 1   Glucosamine-Chondroit-Vit C-Mn (GLUCOSAMINE 1500 COMPLEX) CAPS, Take by mouth., Disp: , Rfl:    hydrochlorothiazide (HYDRODIURIL) 12.5 MG tablet, TAKE 1 TABLET BY MOUTH DAILY, Disp: 100 tablet, Rfl: 2   LUMIGAN 0.01 % SOLN, , Disp: , Rfl:    Multiple Vitamins-Minerals (MULTIVITAMIN ADULT PO), Take by mouth., Disp: , Rfl:    neomycin-polymyxin b-dexamethasone (MAXITROL) 3.5-10000-0.1 OINT, SMARTSIG:Sparingly In Eye(s), Disp: , Rfl:    nystatin cream  (MYCOSTATIN), Apply 1 Application topically 2 (two) times daily., Disp: 30 g, Rfl: 0   omeprazole (PRILOSEC) 20 MG capsule, Take 1 capsule by mouth once daily, Disp: 30 capsule, Rfl: 0   tretinoin (RETIN-A) 0.05 % cream, Apply topically at bedtime., Disp: , Rfl:    valACYclovir (VALTREX) 1000 MG tablet, SMARTSIG:2 Tablet(s) By Mouth, Disp: , Rfl:    vitamin E 400 UNIT capsule, Take 400 Units by mouth daily., Disp: , Rfl:    VITAMIN K PO, Take by mouth., Disp: , Rfl:    No Known Allergies  Past Medical History:  Diagnosis Date   CAD (coronary artery disease)    Cataract    REMOVED BOTH - LENS IMPLANTS   Depression    Family history of ovarian cancer    Glaucoma    Hyperlipidemia    Hypertension    OSA (obstructive sleep apnea) 10/05/2017   Osteopenia    Sleep apnea    WEARS CPAP     Past Surgical History:  Procedure Laterality Date   CATARACT EXTRACTION     11/04/17   COLONOSCOPY     DERMOID CYST  EXCISION     ELBOW FRACTURE SURGERY  2004    Family History  Problem Relation Age of Onset   Ovarian cancer Mother 38       Heart attack at age 15   Heart attack Father        Heart attack at age 43   Lung  cancer Sister 31       smoker   Throat cancer Brother 37       smoker   Lung cancer Maternal Grandfather        dx in his 50s   Esophageal cancer Maternal Uncle    Hodgkin's lymphoma Other 16   Colon cancer Neg Hx    Colon polyps Neg Hx    Rectal cancer Neg Hx    Stomach cancer Neg Hx     Social History   Tobacco Use   Smoking status: Never   Smokeless tobacco: Never  Vaping Use   Vaping status: Never Used  Substance Use Topics   Alcohol use: Yes    Alcohol/week: 2.0 standard drinks of alcohol    Types: 2 Glasses of wine per week    Comment: 2-3 glasses of wine  per month   Drug use: No    ROS   Objective:   Vitals: BP 124/77 (BP Location: Left Arm)   Pulse 78   Temp 98.9 F (37.2 C) (Oral)   Resp 18   Ht 5\' 3"  (1.6 m)   Wt 172 lb (78 kg)    SpO2 97%   BMI 30.47 kg/m   Physical Exam Constitutional:      General: She is not in acute distress.    Appearance: Normal appearance. She is well-developed. She is not ill-appearing, toxic-appearing or diaphoretic.  HENT:     Head: Normocephalic and atraumatic.     Right Ear: External ear normal.     Left Ear: External ear normal.     Nose: Nose normal.     Mouth/Throat:     Mouth: Mucous membranes are moist.  Eyes:     General: No scleral icterus.       Right eye: No discharge.        Left eye: No discharge.     Extraocular Movements: Extraocular movements intact.  Cardiovascular:     Rate and Rhythm: Normal rate and regular rhythm.     Heart sounds: Normal heart sounds. No murmur heard.    No friction rub. No gallop.  Pulmonary:     Effort: Pulmonary effort is normal. No respiratory distress.     Breath sounds: No stridor. No wheezing, rhonchi or rales.  Chest:     Chest wall: No tenderness.  Skin:    General: Skin is warm and dry.  Neurological:     General: No focal deficit present.     Mental Status: She is alert and oriented to person, place, and time.  Psychiatric:        Mood and Affect: Mood normal.        Behavior: Behavior normal.     Assessment and Plan :   PDMP not reviewed this encounter.  1. Persistent cough   2. Seasonal asthma    Given her persistent cough, lower respiratory symptoms recommended an oral prednisone course.  Refilled her albuterol inhaler.  Recommend general supportive care otherwise.  X-ray over-read was pending at time of discharge, recommended follow up with only abnormal results. Otherwise will not call for negative over-read. Patient was in agreement. Counseled patient on potential for adverse effects with medications prescribed/recommended today, ER and return-to-clinic precautions discussed, patient verbalized understanding.    Wallis Bamberg, [place] 10/10/23 1008

## 2023-10-10 NOTE — Discharge Instructions (Addendum)
 Will update you with your chest xray results. For now start prednisone and albuterol. Use cough syrup as needed.

## 2023-10-10 NOTE — ED Triage Notes (Signed)
 Patient states she had flu last month, now with lingering cough, chest tightness.  Taking OTC Mucinex, Robitussin with some relief.  Cough worse at night

## 2023-10-12 NOTE — Telephone Encounter (Signed)
 I called and spoke with pt. Pt states she no longer needs a refill on Tessalon pearls. Pt states she did not hear back from [place] in time and she went to u/c. Pt was told to contact our office if she needed anything, pt verbalized understanding. NFN

## 2023-11-08 ENCOUNTER — Other Ambulatory Visit: Payer: Self-pay | Admitting: Family Medicine

## 2023-11-08 MED ORDER — CETIRIZINE HCL 10 MG PO TABS: 10.0000 mg | ORAL_TABLET | Freq: Every day | ORAL | 11 refills | Status: AC

## 2023-11-08 NOTE — Telephone Encounter (Signed)
 Copied from CRM [id number]. Topic: Clinical - Medication Refill >> Nov 08, 2023 11:16 AM Payton Doughty wrote: Most Recent Primary Care Visit:  Provider: [facility]  [facility]  Visit Type: LAB  Date: 07/16/2023  Medication:  albuterol (VENTOLIN HFA) 108 (90 Base) MCG/ACT inhaler cetirizine (ZYRTEC) 10 MG tablet   Has the patient contacted their pharmacy? Yes Pt instructed to call her dr Is this the correct pharmacy for this prescription? Yes If no, delete pharmacy and type the correct one.  This is the patient's preferred pharmacy:  [facility] [address] [place], [place] - [address] [place] [address] Phone: [phone] Fax: [phone]   Has the prescription been filled recently? No  Is the patient out of the medication? Yes  Has the patient been seen for an appointment in the last year OR does the patient have an upcoming appointment? Yes  Can we respond through MyChart? Yes Pt has appt in May for cpap follow up.  Used to be Dr Craige Cotta pt

## 2023-11-09 ENCOUNTER — Other Ambulatory Visit: Payer: Self-pay | Admitting: Adult Health

## 2023-11-09 NOTE — Telephone Encounter (Unsigned)
 Copied from CRM [id number]. Topic: Clinical - Medication Refill >> Nov 09, 2023  1:46 PM Alexandra [name] wrote: Most Recent Primary Care Visit:  Provider: [facility]  [facility]  Visit Type: LAB  Date: 07/16/2023  Medication: montelukast (SINGULAIR) 10 MG tablet [[id number]] - patient also requesting an update on abuterol inhaler.   Has the patient contacted their pharmacy? No (Agent: If no, request that the patient contact the pharmacy for the refill. If patient does not wish to contact the pharmacy document the reason why and proceed with request.) (Agent: If yes, when and what did the pharmacy advise?)  Is this the correct pharmacy for this prescription? Yes If no, delete pharmacy and type the correct one.  This is the patient's preferred pharmacy:  [facility] [address] [place], [place] - [address] [place] [address] Phone: [phone] Fax: [phone]    Has the prescription been filled recently? No  Is the patient out of the medication? Yes  Has the patient been seen for an appointment in the last year OR does the patient have an upcoming appointment? Yes  Can we respond through MyChart? Yes  Agent: Please be advised that Rx refills may take up to 3 business days. We ask that you follow-up with your pharmacy.

## 2023-11-10 ENCOUNTER — Telehealth: Payer: Self-pay

## 2023-11-10 MED ORDER — ALBUTEROL SULFATE HFA 108 (90 BASE) MCG/ACT IN AERS: 1.0000 | INHALATION_SPRAY | Freq: Four times a day (QID) | RESPIRATORY_TRACT | 3 refills | Status: AC | PRN

## 2023-11-10 NOTE — Telephone Encounter (Signed)
 Copied from CRM [id number]. Topic: Clinical - Prescription Issue >> Nov 10, 2023  3:02 PM Adele Barthel wrote: Reason for CRM:   Patient had put in request for refill of albuterol (VENTOLIN HFA) 108 (90 Base) MCG/ACT inhaler [[id number]] and montelukast (SINGULAIR) 10 MG tablet.  She reports the albuterol was sent to [facility] pharmacy; however, the Singulair was not. Reviewed chart and unable to find additional information about the refill for Singulair other than the original request.   Requests call back if there are any issues with refilling Singulair, she has found the medication very helpful and would like to continue it.   CB#  [phone]   Singulair is not in pt's med list for current or outside. Please advise RX request.

## 2023-11-11 MED ORDER — MONTELUKAST SODIUM 10 MG PO TABS: 10.0000 mg | ORAL_TABLET | Freq: Every day | ORAL | 5 refills | Status: AC

## 2023-11-11 NOTE — Telephone Encounter (Signed)
 Previously on Singulair , will refill . Needs ov in next few months for 1 year follow up

## 2023-11-11 NOTE — Addendum Note (Signed)
 Addended by: Julio Sicks on: 11/11/2023 01:33 PM   Modules accepted: Orders

## 2023-11-11 NOTE — Telephone Encounter (Signed)
 I called and spoke to pt. Pt informed of Tammy's note and pt verbalized understanding. NFN

## 2023-11-19 ENCOUNTER — Ambulatory Visit (INDEPENDENT_AMBULATORY_CARE_PROVIDER_SITE_OTHER): Admitting: Family Medicine

## 2023-11-19 ENCOUNTER — Ambulatory Visit: Payer: Self-pay

## 2023-11-19 VITALS — BP 133/53 | HR 60 | Temp 97.4°F | Ht 63.0 in | Wt 177.0 lb

## 2023-11-19 DIAGNOSIS — R350 Frequency of micturition: Secondary | ICD-10-CM

## 2023-11-19 DIAGNOSIS — R339 Retention of urine, unspecified: Secondary | ICD-10-CM | POA: Diagnosis not present

## 2023-11-19 LAB — POC URINALSYSI DIPSTICK (AUTOMATED)
Bilirubin, UA: NEGATIVE
Glucose, UA: NEGATIVE
Ketones, UA: NEGATIVE
Nitrite, UA: NEGATIVE
Protein, UA: POSITIVE — AB
Spec Grav, UA: 1.01 (ref 1.010–1.025)
Urobilinogen, UA: 0.2 U/dL
pH, UA: 5 (ref 5.0–8.0)

## 2023-11-19 MED ORDER — CEPHALEXIN 500 MG PO CAPS
500.0000 mg | ORAL_CAPSULE | Freq: Two times a day (BID) | ORAL | 0 refills | Status: AC
Start: 2023-11-19 — End: 2023-11-26

## 2023-11-19 NOTE — Progress Notes (Signed)
 Acute Office Visit  Subjective:     Patient ID: Alexandra [name], female    DOB: 08/05/1951, 73 y.o.   MRN: [id number]  No chief complaint on file.   HPI Patient is in today for urinary symptoms.   Discussed the use of AI scribe software for clinical note transcription with the patient, who gave verbal consent to proceed.  History of Present Illness Alexandra [name] is a 73 year old female who presents with urinary issues including pressure and hematuria.  She has been experiencing urinary issues for the past couple of days, characterized by a sensation of pressure and the presence of very light blood on toilet paper. She feels the urge to urinate again shortly after voiding, although the volume of urine is small. The pressure is most noticeable when she first begins to urinate, and it feels similar to the discomfort experienced when one waits too long to urinate. No back pain, fever, or burning sensation during urination.  A recent urinalysis showed leukocytes, blood, and protein, but no nitrites. She is not experiencing any symptoms like nausea, vomiting, or diarrhea.  She has not been drinking much water today, which may have affected her blood pressure readings. She confirms she is staying hydrated overall and has not recently taken any antibiotics. She is not allergic to any medications. No dizziness or lightheadedness.         ROS All review of systems negative except what is listed in the HPI      Objective:    BP (!) 133/53   Pulse 60   Temp (!) 97.4 F (36.3 C) (Oral)   Ht 5\' 3"  (1.6 m)   Wt 177 lb (80.3 kg)   SpO2 100%   BMI 31.35 kg/m    Physical Exam Vitals reviewed.  Constitutional:      Appearance: Normal appearance.  Abdominal:     General: Abdomen is flat. There is no distension.     Tenderness: There is no abdominal tenderness. There is no right CVA tenderness, left CVA tenderness or guarding.  Neurological:     Mental Status: She is alert and  oriented to person, place, and time.  Psychiatric:        Mood and Affect: Mood normal.        Thought Content: Thought content normal.     Results for orders placed or performed in visit on 11/19/23  POCT Urinalysis Dipstick (Automated)  Result Value Ref Range   Color, UA yellow    Clarity, UA cloudy    Glucose, UA Negative Negative   Bilirubin, UA negative    Ketones, UA negative    Spec Grav, UA 1.010 1.010 - 1.025   Blood, UA large    pH, UA 5.0 5.0 - 8.0   Protein, UA Positive (A) Negative   Urobilinogen, UA 0.2 0.2 or 1.0 E.U./dL   Nitrite, UA negative    Leukocytes, UA Large (3+) (A) Negative        Assessment & Plan:   Problem List Items Addressed This Visit   None Visit Diagnoses       Urinary frequency    -  Primary   Relevant Medications   cephALEXin (KEFLEX) 500 MG capsule   Other Relevant Orders   POCT Urinalysis Dipstick (Automated) (Completed)   Urine Culture   Urine Microscopic     Urinary retention       Relevant Orders   POCT Urinalysis Dipstick (Automated) (Completed)   Urine Culture  Urine Microscopic       Assessment & Plan Urinary Tract Infection (UTI) Urinalysis suggests UTI.  - Send urine for culture to confirm UTI and guide antibiotic therapy. - Prescribe Keflex for 7 days. - Advise increased water intake  - Instruct to start antibiotics today and monitor for worsening symptoms - Inform that antibiotic may need adjustment based on culture results.      Meds ordered this encounter  Medications   cephALEXin (KEFLEX) 500 MG capsule    Sig: Take 1 capsule (500 mg total) by mouth 2 (two) times daily for 7 days.    Dispense:  14 capsule    Refill:  0    Supervising Provider:   Danise Edge A [[id number]]    Return if symptoms worsen or fail to improve.  Alexandra Dana, NP

## 2023-11-19 NOTE — Telephone Encounter (Signed)
 Chief Complaint: frequency, pressure Symptoms: frequency, pressure, blood on tissue Frequency: "few days" Pertinent Negatives: Patient denies fever, chills, flank pain Disposition: [] ED /[] Urgent Care (no appt availability in office) / [x] Appointment(In office/virtual)/ []  [facility]/ [] Home Care/ [] Refused Recommended Disposition /[]  Mobile Bus/ []  Follow-up with PCP Additional Notes: Pt reports UTI symptoms for "a few days." Pt endorses 4/10 "pressure" with frequency and urgency. Pt states she saw what appeared to be a bit of blood on the tissue today after wiping. Pt denies that this was blood from her rectum. Pt denies chills, fever, back pain, flank pain, N/V. RN advised pt she should be seen today. No availability at her PCP office for that timeframe. RN scheduled pt for 1200 today at [facility]. Pt agreeable to that plan. RN advised pt if she worsens before then to call us back. Pt verbalized understanding.    Copied from CRM [id number]. Topic: Clinical - Red Word Triage >> Nov 19, 2023 10:44 AM Mackie Pai E wrote: [facility] that prompted transfer to Nurse Triage: UTI symptoms. Patient states that she is having to go to the bathroom more frequently, has a little bit of a burning sensation while urinating, and there was a some blood on the toilet paper after she went to the bathroom. Symptoms have been going for a couple days. Reason for Disposition  Urinating more frequently than usual (i.e., frequency)  Answer Assessment - Initial Assessment Questions 1. SYMPTOM: "What's the main symptom you're concerned about?" (e.g., frequency, incontinence)     Frequency, pressure  2. ONSET: "When did the frequency start?"     A couple days 3. PAIN: "Is there any pain?" If Yes, ask: "How bad is it?" (Scale: 1-10; mild, moderate, severe)     4/10  - pressure is present constantly 4. CAUSE: "What do you think is causing the symptoms?"     UTI 5. OTHER SYMPTOMS: "Do you have any  other symptoms?" (e.g., blood in urine, fever, flank pain, pain with urination)     "When I go I feel a real pressure, a little bit of a twinge, it feels like I waited too long but I haven't, and when I do go, there isn't very much", frequency. Denies back or flank pain. Faint amount of blood on the tissue. Blood not coming from her rectum, no blood present in the urine. No fever or chills  Protocols used: Urinary Symptoms-A-AH

## 2023-11-30 ENCOUNTER — Other Ambulatory Visit: Payer: Self-pay | Admitting: Family Medicine

## 2023-11-30 DIAGNOSIS — Z1231 Encounter for screening mammogram for malignant neoplasm of breast: Secondary | ICD-10-CM

## 2023-12-22 ENCOUNTER — Ambulatory Visit (HOSPITAL_BASED_OUTPATIENT_CLINIC_OR_DEPARTMENT_OTHER): Admitting: Nurse Practitioner

## 2023-12-22 DIAGNOSIS — L570 Actinic keratosis: Secondary | ICD-10-CM | POA: Diagnosis not present

## 2023-12-22 DIAGNOSIS — L814 Other melanin hyperpigmentation: Secondary | ICD-10-CM | POA: Diagnosis not present

## 2023-12-22 DIAGNOSIS — L578 Other skin changes due to chronic exposure to nonionizing radiation: Secondary | ICD-10-CM | POA: Diagnosis not present

## 2023-12-22 DIAGNOSIS — D225 Melanocytic nevi of trunk: Secondary | ICD-10-CM | POA: Diagnosis not present

## 2023-12-22 DIAGNOSIS — L821 Other seborrheic keratosis: Secondary | ICD-10-CM | POA: Diagnosis not present

## 2023-12-27 DIAGNOSIS — H401122 Primary open-angle glaucoma, left eye, moderate stage: Secondary | ICD-10-CM | POA: Diagnosis not present

## 2023-12-27 DIAGNOSIS — H401113 Primary open-angle glaucoma, right eye, severe stage: Secondary | ICD-10-CM | POA: Diagnosis not present

## 2023-12-27 DIAGNOSIS — Z961 Presence of intraocular lens: Secondary | ICD-10-CM | POA: Diagnosis not present

## 2023-12-28 ENCOUNTER — Encounter: Payer: Self-pay | Admitting: Adult Health

## 2023-12-28 ENCOUNTER — Ambulatory Visit: Admitting: Adult Health

## 2023-12-28 VITALS — BP 110/60 | HR 61 | Ht 63.0 in | Wt 177.4 lb

## 2023-12-28 DIAGNOSIS — G4733 Obstructive sleep apnea (adult) (pediatric): Secondary | ICD-10-CM | POA: Diagnosis not present

## 2023-12-28 DIAGNOSIS — J45991 Cough variant asthma: Secondary | ICD-10-CM | POA: Diagnosis not present

## 2023-12-28 DIAGNOSIS — Z9981 Dependence on supplemental oxygen: Secondary | ICD-10-CM | POA: Diagnosis not present

## 2023-12-28 NOTE — Assessment & Plan Note (Signed)
 Excellent control compliance with nocturnal CPAP.  Continue all current settings  Plan  Patient Instructions  Zyrtec  10 mg daily As needed   Flonase 2 puffs daily As needed   Albuterol  inhaler 1 to 2 puffs as needed every 6 hours  Keep up the good work Glass blower/designer your CPAP machine all night Work on healthy weight Do not drive if sleepy Follow up in 1 year with Dr. Gaynell Keeler or French Jester NP

## 2023-12-28 NOTE — Assessment & Plan Note (Signed)
 Cough variant asthma recent exacerbation in March 2025.  Symptoms are much improved.  We discussed asthma action plan.  May use Zyrtec  and Flonase as needed.  Albuterol  inhaler as needed.  Advise if symptoms are worsening or recurrent will need a more aggressive maintenance regimen and consideration of restarting Singulair .  Plan  Patient Instructions  Zyrtec  10 mg daily As needed   Flonase 2 puffs daily As needed   Albuterol  inhaler 1 to 2 puffs as needed every 6 hours  Keep up the good work Glass blower/designer your CPAP machine all night Work on healthy weight Do not drive if sleepy Follow up in 1 year with Dr. Gaynell Keeler or French Jester NP

## 2023-12-28 NOTE — Patient Instructions (Addendum)
 Zyrtec  10 mg daily As needed   Flonase 2 puffs daily As needed   Albuterol  inhaler 1 to 2 puffs as needed every 6 hours  Keep up the good work Glass blower/designer your CPAP machine all night Work on healthy weight Do not drive if sleepy Follow up in 1 year with Dr. Gaynell Keeler or French Jester NP

## 2023-12-28 NOTE — Progress Notes (Signed)
 @Patient  ID: Alexandra [name], female    DOB: 06/18/1951, 73 y.o.   MRN: [id number]  Chief Complaint  Patient presents with   Follow-up    Referring provider: Jess Morita, MD  HPI: 73 yo female followed for OSA   TEST/EVENTS :  HST 10/02/17 >> AHI 8, SaO2 low 81%.   12/28/2023 Follow up : OSA  Patient returns for a 1 year follow up for sleep apnea. Last seen May 2024 with Dr. Matilde Son. Says she is doing well on CPAP . Feels she benefits from CPAP with decreased daytime sleepiness.  Wears her CPAP every night.  CPAP download shows excellent compliance with daily average usage a 7 hours.  Patient is on auto CPAP of 15 cm H2O.  AHI 0.8/hour.  Daily average pressure 11.4 cm H2O.  Uses a nasal mask.  DME company is [employer].  Patient was previously seen in the past for cough variant asthma and allergic rhinitis.  She was previously treated with Singulair , Zyrtec  and albuterol  as needed.  Patient says that she has done exceptionally well.  Her symptoms totally resolved and she stopped having to take her Singulair  on a regular basis.  Occasionally takes Zyrtec .  Did have a flareup after traveling to [place] earlier this year.  Took her few weeks to get better.  Initially had a cough and throat clearing.  Went to urgent care was given a prednisone  taper.  Symptoms totally resolved.  She is currently using Flonase most days.  Takes Zyrtec  as needed.  Cough has totally resolved.  She has no fever, discolored mucus or shortness of breath.  Chest x-ray in March was clear.   No Known Allergies  Immunization History  Administered Date(s) Administered   Fluad Quad(high Dose 65+) 05/15/2020, 06/18/2021   Influenza Whole 06/30/2007   Influenza,inj,Quad PF,6+ Mos 05/12/2013, 05/14/2014, 06/01/2014, 06/10/2015, 06/17/2016, 05/12/2017, 05/18/2018, 04/04/2019   Influenza,trivalent, recombinat, inj, PF 05/01/2022   Influenza-Unspecified 05/14/2023   PFIZER SARS-COV-2 Pediatric Vaccination 5-13yrs 06/14/2020    PFIZER(Purple Top)SARS-COV-2 Vaccination 08/30/2019, 09/20/2019, 06/14/2020, 12/19/2020   Pfizer Covid-19 Vaccine Bivalent Booster 83yrs & up 08/12/2021, 05/01/2022, 05/14/2023   Pneumococcal Conjugate-13 08/11/2017   Pneumococcal Polysaccharide-23 04/04/2019   Td 09/11/2006   Tdap 12/15/2016   Zoster Recombinant(Shingrix) 05/08/2021, 09/03/2021   Zoster, Live 05/08/2011    Past Medical History:  Diagnosis Date   CAD (coronary artery disease)    Cataract    REMOVED BOTH - LENS IMPLANTS   Depression    Family history of ovarian cancer    Glaucoma    Hyperlipidemia    Hypertension    OSA (obstructive sleep apnea) 10/05/2017   Osteopenia    Sleep apnea    WEARS CPAP    Tobacco History: Social History   Tobacco Use  Smoking Status Never  Smokeless Tobacco Never   Counseling given: Not Answered   Outpatient Medications Prior to Visit  Medication Sig Dispense Refill   acetaZOLAMIDE (DIAMOX) 500 MG capsule Take 500 mg by mouth 2 (two) times daily.     albuterol  (VENTOLIN  HFA) 108 (90 Base) MCG/ACT inhaler Inhale 1-2 puffs into the lungs every 6 (six) hours as needed for wheezing or shortness of breath. 18 g 3   Ascorbic Acid (VITAMIN C) 1000 MG tablet Take 1,000 mg by mouth daily.     aspirin EC 81 MG tablet Take 81 mg by mouth daily. Swallow whole.     atorvastatin  (LIPITOR) 40 MG tablet Take 1 tablet (40 mg total) by mouth  daily. 90 tablet 1   B Complex-C (SUPER B COMPLEX PO) Take by mouth.     benzonatate  (TESSALON ) 100 MG capsule Take 1 capsule (100 mg total) by mouth 3 (three) times daily as needed for cough. 30 capsule 0   brimonidine (ALPHAGAN) 0.2 % ophthalmic solution   3   Calcium  Carbonate-Vit D-Min (CALCIUM  1200 PO) Take by mouth.     cetirizine  (ZYRTEC ) 10 MG tablet Take 1 tablet (10 mg total) by mouth daily. 30 tablet 11   dorzolamide-timolol (COSOPT) 22.3-6.8 MG/ML ophthalmic solution   1   Glucosamine-Chondroit-Vit C-Mn (GLUCOSAMINE 1500 COMPLEX) CAPS Take  by mouth.     hydrochlorothiazide  (HYDRODIURIL ) 12.5 MG tablet TAKE 1 TABLET BY MOUTH DAILY 100 tablet 2   LUMIGAN 0.01 % SOLN      montelukast  (SINGULAIR ) 10 MG tablet Take 1 tablet (10 mg total) by mouth at bedtime. 30 tablet 5   Multiple Vitamins-Minerals (MULTIVITAMIN ADULT PO) Take by mouth.     omeprazole  (PRILOSEC) 20 MG capsule Take 1 capsule by mouth once daily 30 capsule 0   promethazine -dextromethorphan (PROMETHAZINE -DM) 6.25-15 MG/5ML syrup Take 5 mLs by mouth 3 (three) times daily as needed for cough. 200 mL 0   tretinoin (RETIN-A) 0.05 % cream Apply topically at bedtime.     valACYclovir (VALTREX) 1000 MG tablet SMARTSIG:2 Tablet(s) By Mouth     vitamin E 400 UNIT capsule Take 400 Units by mouth daily.     VITAMIN K PO Take by mouth.     nystatin  cream (MYCOSTATIN ) Apply 1 Application topically 2 (two) times daily. 30 g 0   predniSONE  (DELTASONE ) 20 MG tablet Take 2 tablets daily with breakfast. 10 tablet 0   No facility-administered medications prior to visit.     Review of Systems:   Constitutional:   No  weight loss, night sweats,  Fevers, chills, fatigue, or  lassitude.  HEENT:   No headaches,  Difficulty swallowing,  Tooth/dental problems, or  Sore throat,                No sneezing, itching, ear ache, nasal congestion, post nasal drip,   CV:  No chest pain,  Orthopnea, PND, swelling in lower extremities, anasarca, dizziness, palpitations, syncope.   GI  No heartburn, indigestion, abdominal pain, nausea, vomiting, diarrhea, change in bowel habits, loss of appetite, bloody stools.   Resp: No shortness of breath with exertion or at rest.  No excess mucus, no productive cough,  No non-productive cough,  No coughing up of blood.  No change in color of mucus.  No wheezing.  No chest wall deformity  Skin: no rash or lesions.  GU: no dysuria, change in color of urine, no urgency or frequency.  No flank pain, no hematuria   MS:  No joint pain or swelling.  No decreased  range of motion.  No back pain.    Physical Exam  BP 110/60 (BP Location: Left Arm, Patient Position: Sitting, Cuff Size: Large)   Pulse 61   Ht 5\' 3"  (1.6 m)   Wt 177 lb 6.4 oz (80.5 kg)   SpO2 100%   BMI 31.42 kg/m   GEN: A/Ox3; pleasant , NAD, well nourished    HEENT:  [place]/AT,   NOSE-clear, THROAT-clear, no lesions, no postnasal drip or exudate noted.  Class III MP airway  NECK:  Supple w/ fair ROM; no JVD; normal carotid impulses w/o bruits; no thyromegaly or nodules palpated; no lymphadenopathy.    RESP  Clear  P & A; w/o, wheezes/ rales/ or rhonchi. no accessory muscle use, no dullness to percussion  CARD:  RRR, no m/r/g, no peripheral edema, pulses intact, no cyanosis or clubbing.  GI:   Soft & nt; nml bowel sounds; no organomegaly or masses detected.   Musco: Warm bil, no deformities or joint swelling noted.   Neuro: alert, no focal deficits noted.    Skin: Warm, no lesions or rashes    Lab Results:  CBC    Component Value Date/Time   WBC 5.2 07/06/2023 1138   RBC 5.07 07/06/2023 1138   HGB 14.9 07/06/2023 1138   HCT 45.3 07/06/2023 1138   PLT 271.0 07/06/2023 1138   MCV 89.4 07/06/2023 1138   MCHC 32.9 07/06/2023 1138   RDW 13.8 07/06/2023 1138   LYMPHSABS 1.4 07/06/2023 1138   MONOABS 0.4 07/06/2023 1138   EOSABS 0.2 07/06/2023 1138   BASOSABS 0.0 07/06/2023 1138    BMET    Component Value Date/Time   NA 141 07/06/2023 1138   K 3.6 07/06/2023 1138   CL 107 07/06/2023 1138   CO2 26 07/06/2023 1138   GLUCOSE 107 (H) 07/06/2023 1138   BUN 21 07/06/2023 1138   CREATININE 1.00 07/06/2023 1138   CREATININE 0.88 05/08/2011 1418   CALCIUM  9.3 07/06/2023 1138   GFRNONAA 92.98 03/26/2010 1049   GFRAA  09/15/2009 2344    >60        The eGFR has been calculated using the MDRD equation. This calculation has not been validated in all clinical situations. eGFR's persistently <60 mL/min signify possible Chronic Kidney Disease.    BNP No results  found for: "BNP"  ProBNP No results found for: "PROBNP"  Imaging: No results found.  Administration History     None          Latest Ref Rng & Units 11/04/2021    4:23 PM  PFT Results  FVC-Pre L 2.85   FVC-Predicted Pre % 102   FVC-Post L 2.72   FVC-Predicted Post % 97   Pre FEV1/FVC % % 80   Post FEV1/FCV % % 82   FEV1-Pre L 2.28   FEV1-Predicted Pre % 107   FEV1-Post L 2.22   DLCO uncorrected ml/min/mmHg 22.96   DLCO UNC% % 125   DLCO corrected ml/min/mmHg 23.40   DLCO COR %Predicted % 128   DLVA Predicted % 128   TLC L 4.85   TLC % Predicted % 102   RV % Predicted % 94     No results found for: "NITRICOXIDE"      Assessment & Plan:   OSA (obstructive sleep apnea) Excellent control compliance with nocturnal CPAP.  Continue all current settings  Plan  Patient Instructions  Zyrtec  10 mg daily As needed   Flonase 2 puffs daily As needed   Albuterol  inhaler 1 to 2 puffs as needed every 6 hours  Keep up the good work Glass blower/designer your CPAP machine all night Work on healthy weight Do not drive if sleepy Follow up in 1 year with Dr. Gaynell Keeler or Aletha Allebach NP      Cough variant asthma Cough variant asthma recent exacerbation in March 2025.  Symptoms are much improved.  We discussed asthma action plan.  May use Zyrtec  and Flonase as needed.  Albuterol  inhaler as needed.  Advise if symptoms are worsening or recurrent will need a more aggressive maintenance regimen and consideration of restarting Singulair .  Plan  Patient Instructions  Zyrtec  10 mg daily As needed  Flonase 2 puffs daily As needed   Albuterol  inhaler 1 to 2 puffs as needed every 6 hours  Keep up the good work Glass blower/designer your CPAP machine all night Work on healthy weight Do not drive if sleepy Follow up in 1 year with Dr. Gaynell Keeler or Teairra Millar NP        Roena Clark, NP 12/28/2023

## 2024-01-04 ENCOUNTER — Encounter: Payer: Self-pay | Admitting: Family Medicine

## 2024-01-04 ENCOUNTER — Ambulatory Visit (INDEPENDENT_AMBULATORY_CARE_PROVIDER_SITE_OTHER): Payer: Medicare Other | Admitting: Family Medicine

## 2024-01-04 ENCOUNTER — Telehealth: Payer: Self-pay | Admitting: Pharmacist

## 2024-01-04 VITALS — BP 138/70 | HR 64 | Temp 98.0°F | Ht 63.0 in | Wt 179.0 lb

## 2024-01-04 DIAGNOSIS — R7303 Prediabetes: Secondary | ICD-10-CM | POA: Diagnosis not present

## 2024-01-04 DIAGNOSIS — M8580 Other specified disorders of bone density and structure, unspecified site: Secondary | ICD-10-CM | POA: Diagnosis not present

## 2024-01-04 DIAGNOSIS — E785 Hyperlipidemia, unspecified: Secondary | ICD-10-CM

## 2024-01-04 DIAGNOSIS — E669 Obesity, unspecified: Secondary | ICD-10-CM

## 2024-01-04 LAB — CBC WITH DIFFERENTIAL/PLATELET
Basophils Absolute: 0 10*3/uL (ref 0.0–0.1)
Basophils Relative: 0.8 % (ref 0.0–3.0)
Eosinophils Absolute: 0.2 10*3/uL (ref 0.0–0.7)
Eosinophils Relative: 4 % (ref 0.0–5.0)
HCT: 40.7 % (ref 36.0–46.0)
Hemoglobin: 13.3 g/dL (ref 12.0–15.0)
Lymphocytes Relative: 35.1 % (ref 12.0–46.0)
Lymphs Abs: 1.6 10*3/uL (ref 0.7–4.0)
MCHC: 32.8 g/dL (ref 30.0–36.0)
MCV: 81.8 fl (ref 78.0–100.0)
Monocytes Absolute: 0.4 10*3/uL (ref 0.1–1.0)
Monocytes Relative: 9.3 % (ref 3.0–12.0)
Neutro Abs: 2.3 10*3/uL (ref 1.4–7.7)
Neutrophils Relative %: 50.8 % (ref 43.0–77.0)
Platelets: 253 10*3/uL (ref 150.0–400.0)
RBC: 4.97 Mil/uL (ref 3.87–5.11)
RDW: 16.2 % — ABNORMAL HIGH (ref 11.5–15.5)
WBC: 4.5 10*3/uL (ref 4.0–10.5)

## 2024-01-04 LAB — LIPID PANEL
Cholesterol: 147 mg/dL (ref 0–200)
HDL: 78.1 mg/dL (ref >39.00)
LDL Cholesterol: 52 mg/dL (ref 0–99)
NonHDL: 68.63
Total CHOL/HDL Ratio: 2
Triglycerides: 84 mg/dL (ref 0.0–149.0)
VLDL: 16.8 mg/dL (ref 0.0–40.0)

## 2024-01-04 LAB — BASIC METABOLIC PANEL WITH GFR
BUN: 19 mg/dL (ref 6–23)
CO2: 23 meq/L (ref 19–32)
Calcium: 9.3 mg/dL (ref 8.4–10.5)
Chloride: 111 meq/L (ref 96–112)
Creatinine, Ser: 0.74 mg/dL (ref 0.40–1.20)
GFR: 81.37 mL/min (ref >60.00)
Glucose, Bld: 96 mg/dL (ref 70–99)
Potassium: 4 meq/L (ref 3.5–5.1)
Sodium: 143 meq/L (ref 135–145)

## 2024-01-04 LAB — HEPATIC FUNCTION PANEL
ALT: 20 U/L (ref 0–35)
AST: 21 U/L (ref 0–37)
Albumin: 4.2 g/dL (ref 3.5–5.2)
Alkaline Phosphatase: 49 U/L (ref 39–117)
Bilirubin, Direct: 0.2 mg/dL (ref 0.0–0.3)
Total Bilirubin: 1.2 mg/dL (ref 0.2–1.2)
Total Protein: 6.5 g/dL (ref 6.0–8.3)

## 2024-01-04 LAB — TSH: TSH: 2.57 u[IU]/mL (ref 0.35–5.50)

## 2024-01-04 LAB — MAGNESIUM: Magnesium: 2.1 mg/dL (ref 1.5–2.5)

## 2024-01-04 LAB — HEMOGLOBIN A1C: Hgb A1c MFr Bld: 6.2 % (ref 4.6–6.5)

## 2024-01-04 MED ORDER — TIRZEPATIDE 2.5 MG/0.5ML ~~LOC~~ SOAJ: 2.5000 mg | SUBCUTANEOUS | 1 refills | Status: DC

## 2024-01-04 NOTE — Assessment & Plan Note (Signed)
 New dx at last visit.  A1C 6.1%.  She exercises regularly- it is almost an addiction for her.  Repeat labs and see if other steps needs to be taken.

## 2024-01-04 NOTE — Telephone Encounter (Signed)
 Mounjaro is approved exclusively as an adjunct to diet and exercise to improve glycemic control in adults with type 2 diabetes mellitus, not prediabetes. A review of patient's medical chart reveals no documented diagnosis of type 2 diabetes or an A1C indicative of diabetes. Therefore, they do not currently meet the criteria for prior authorization of this medication. If clinically appropriate, alternative options such as Saxenda, Zepbound, or Frederik Jansky may be considered for this patient.   Thank you, Dene Fines, PharmD Clinical Pharmacist  [facility]  Direct Dial: [phone]

## 2024-01-04 NOTE — Assessment & Plan Note (Signed)
Chronic problem, on Lipitor 40mg daily w/o difficulty.  Check labs.  Adjust meds prn  

## 2024-01-04 NOTE — Patient Instructions (Signed)
 Follow up in 2 months to recheck weight loss progress We'll notify you of your lab results and make any changes if needed We will try everything we can to get the Mounjaro /Zepbound  approved Keep up the good work on healthy diet and regular exercise- I'm so proud of you!!! We'll call you to schedule your bone density appt Call with any questions or concerns Stay Safe!  Stay Healthy! Have a great summer!!!

## 2024-01-04 NOTE — Assessment & Plan Note (Signed)
 Repeat DEXA ordered

## 2024-01-04 NOTE — Progress Notes (Signed)
   Subjective:    Patient ID: Alexandra [name], female    DOB: [birth year], 73 y.o.   MRN: [id number]  HPI Hyperlipidemia- chronic problem, on Lipitor 40mg  daily.  Denies CP, SOB, abd pain, N/V.  Obesity- current weight 179 lbs, BMI 31.71.  Has hx of pre-diabetes (last A1C 6.1%), hyperlipidemia, CAD, OSA.  She is very worried about her weight and the risk that it poses.  Exercises regularly.  Osteopenia- chronic problem.  Due for repeat DEXA.   Review of Systems For ROS see HPI     Objective:   Physical Exam Vitals reviewed.  Constitutional:      General: She is not in acute distress.    Appearance: Normal appearance. She is well-developed. She is not ill-appearing.  HENT:     Head: Normocephalic and atraumatic.  Eyes:     Conjunctiva/sclera: Conjunctivae normal.     Pupils: Pupils are equal, round, and reactive to light.  Neck:     Thyroid : No thyromegaly.  Cardiovascular:     Rate and Rhythm: Normal rate and regular rhythm.     Pulses: Normal pulses.     Heart sounds: Normal heart sounds. No murmur heard. Pulmonary:     Effort: Pulmonary effort is normal. No respiratory distress.     Breath sounds: Normal breath sounds.  Abdominal:     General: There is no distension.     Palpations: Abdomen is soft.     Tenderness: There is no abdominal tenderness.  Musculoskeletal:     Cervical back: Normal range of motion and neck supple.     Right lower leg: No edema.     Left lower leg: No edema.  Lymphadenopathy:     Cervical: No cervical adenopathy.  Skin:    General: Skin is warm and dry.  Neurological:     General: No focal deficit present.     Mental Status: She is alert and oriented to person, place, and time.  Psychiatric:        Mood and Affect: Mood normal.        Behavior: Behavior normal.        Thought Content: Thought content normal.           Assessment & Plan:

## 2024-01-04 NOTE — Assessment & Plan Note (Signed)
 Ongoing issue for pt.  Current BMI 31.71  She is concerned about weight related problems as she has pre-diabetes, hyperlipidemia, CAD, OSA.  She would like to start medication for weight loss- specifically a GLP 1.  She is not sure about her insurance coverage, but we can try to get this approved for her.  She exercises regularly- in her words, this is 'almost an addiction' for her.  Does have a hard time w/ food noise and cravings.  Will send Mounjaro  prescription and hope this is approved.  Pt expressed understanding and is in agreement w/ plan.

## 2024-01-05 ENCOUNTER — Ambulatory Visit: Payer: Self-pay | Admitting: Family Medicine

## 2024-01-05 NOTE — Telephone Encounter (Signed)
 Patient is asking what would make the RDW to be high as she notes this has been normal for her in the past.

## 2024-01-11 ENCOUNTER — Telehealth: Payer: Self-pay

## 2024-01-11 ENCOUNTER — Ambulatory Visit (INDEPENDENT_AMBULATORY_CARE_PROVIDER_SITE_OTHER): Admitting: *Deleted

## 2024-01-11 ENCOUNTER — Ambulatory Visit: Payer: Self-pay | Admitting: Family Medicine

## 2024-01-11 DIAGNOSIS — Z0000 Encounter for general adult medical examination without abnormal findings: Secondary | ICD-10-CM

## 2024-01-11 NOTE — Progress Notes (Signed)
 Subjective:   Alexandra [name] is a 73 y.o. female who presents for Medicare Annual (Subsequent) preventive examination.  Visit Complete: Virtual I connected with  Delice Felt on 01/11/24 by a audio enabled telemedicine application and verified that I am speaking with the correct person using two identifiers.  Patient Location: Home  Provider Location: Home Office  I discussed the limitations of evaluation and management by telemedicine. The patient expressed understanding and agreed to proceed.  Vital Signs: Because this visit was a virtual/telehealth visit, some criteria may be missing or patient reported. Any vitals not documented were not able to be obtained and vitals that have been documented are patient reported.    Cardiac Risk Factors include: advanced age (>72men, >10 women);hypertension     Objective:     There were no vitals filed for this visit. There is no height or weight on file to calculate BMI.     01/11/2024   11:50 AM 10/10/2023    9:47 AM 12/17/2022    1:34 PM 12/11/2021   11:47 AM 05/13/2020   11:18 AM 12/24/2017    9:36 AM  Advanced Directives  Does Patient Have a Medical Advance Directive? Yes Yes Yes Yes No No  Type of Forensic scientist of [employer] Power of [place];Living will    Copy of Healthcare Power of Attorney in Chart? No - copy requested  No - copy requested No - copy requested    Would patient like information on creating a medical advance directive?     Yes (MAU/Ambulatory/Procedural Areas - Information given) Yes (MAU/Ambulatory/Procedural Areas - Information given)    Current Medications (verified) Outpatient Encounter Medications as of 01/11/2024  Medication Sig   acetaZOLAMIDE (DIAMOX) 500 MG capsule Take 500 mg by mouth 2 (two) times daily.   albuterol  (VENTOLIN  HFA) 108 (90 Base) MCG/ACT inhaler Inhale 1-2 puffs into the lungs every 6 (six) hours as needed for wheezing or shortness of  breath.   Ascorbic Acid (VITAMIN C) 1000 MG tablet Take 1,000 mg by mouth daily.   aspirin EC 81 MG tablet Take 81 mg by mouth daily. Swallow whole.   atorvastatin  (LIPITOR) 40 MG tablet Take 1 tablet (40 mg total) by mouth daily.   B Complex-C (SUPER B COMPLEX PO) Take by mouth.   benzonatate  (TESSALON ) 100 MG capsule Take 1 capsule (100 mg total) by mouth 3 (three) times daily as needed for cough.   brimonidine (ALPHAGAN) 0.2 % ophthalmic solution    Calcium  Carbonate-Vit D-Min (CALCIUM  1200 PO) Take by mouth.   cetirizine  (ZYRTEC ) 10 MG tablet Take 1 tablet (10 mg total) by mouth daily.   dorzolamide-timolol (COSOPT) 22.3-6.8 MG/ML ophthalmic solution    Glucosamine-Chondroit-Vit C-Mn (GLUCOSAMINE 1500 COMPLEX) CAPS Take by mouth.   hydrochlorothiazide  (HYDRODIURIL ) 12.5 MG tablet TAKE 1 TABLET BY MOUTH DAILY   LUMIGAN 0.01 % SOLN    montelukast  (SINGULAIR ) 10 MG tablet Take 1 tablet (10 mg total) by mouth at bedtime.   Multiple Vitamins-Minerals (MULTIVITAMIN ADULT PO) Take by mouth.   omeprazole  (PRILOSEC) 20 MG capsule Take 1 capsule by mouth once daily   promethazine -dextromethorphan (PROMETHAZINE -DM) 6.25-15 MG/5ML syrup Take 5 mLs by mouth 3 (three) times daily as needed for cough.   tirzepatide  (MOUNJARO ) 2.5 MG/0.5ML Pen Inject 2.5 mg into the skin once a week.   tretinoin (RETIN-A) 0.05 % cream Apply topically at bedtime.   valACYclovir (VALTREX) 1000 MG tablet SMARTSIG:2 Tablet(s) By Mouth  vitamin E 400 UNIT capsule Take 400 Units by mouth daily.   VITAMIN K PO Take by mouth.   No facility-administered encounter medications on file as of 01/11/2024.    Allergies (verified) Patient has no known allergies.   History: Past Medical History:  Diagnosis Date   CAD (coronary artery disease)    Cataract    REMOVED BOTH - LENS IMPLANTS   Depression    Family history of ovarian cancer    Glaucoma    Hyperlipidemia    Hypertension    OSA (obstructive sleep apnea) 10/05/2017    Osteopenia    Sleep apnea    WEARS CPAP   Past Surgical History:  Procedure Laterality Date   CATARACT EXTRACTION     11/04/17   COLONOSCOPY     DERMOID CYST  EXCISION     ELBOW FRACTURE SURGERY  2004   Family History  Problem Relation Age of Onset   Ovarian cancer Mother 109       Heart attack at age 31   Heart attack Father        Heart attack at age 28   Lung cancer Sister 23       smoker   Throat cancer Brother 55       smoker   Lung cancer Maternal Grandfather        dx in his 79s   Esophageal cancer Maternal Uncle    Hodgkin's lymphoma Other 16   Colon cancer Neg Hx    Colon polyps Neg Hx    Rectal cancer Neg Hx    Stomach cancer Neg Hx    Social History   Socioeconomic History   Marital status: Married    Spouse name: Not on file   Number of children: 2   Years of education: Not on file   Highest education level: Associate degree: academic program  Occupational History   Occupation: retired   Occupation: Research scientist (medical)  Tobacco Use   Smoking status: Never   Smokeless tobacco: Never  Vaping Use   Vaping status: Never Used  Substance and Sexual Activity   Alcohol use: Yes    Alcohol/week: 2.0 standard drinks of alcohol    Types: 2 Glasses of wine per week    Comment: 2-3 glasses of wine  per month   Drug use: No   Sexual activity: Not Currently  Other Topics Concern   Not on file  Social History Narrative   Not on file   Social Drivers of Health   Financial Resource Strain: Low Risk  (01/11/2024)   Overall Financial Resource Strain (CARDIA)    Difficulty of Paying Living Expenses: Not hard at all  Food Insecurity: No Food Insecurity (01/11/2024)   Hunger Vital Sign    Worried About Running Out of Food in the Last Year: Never true    Ran Out of Food in the Last Year: Never true  Transportation Needs: No Transportation Needs (01/11/2024)   PRAPARE - Administrator, Civil Service (Medical): No    Lack of Transportation  (Non-Medical): No  Physical Activity: Sufficiently Active (01/11/2024)   Exercise Vital Sign    Days of Exercise per Week: 7 days    Minutes of Exercise per Session: 60 min  Stress: No Stress Concern Present (01/11/2024)   [employer] of Occupational Health - Occupational Stress Questionnaire    Feeling of Stress : Not at all  Social Connections: Socially Integrated (01/11/2024)   Social Connection and Isolation Panel [  NHANES]    Frequency of Communication with Friends and Family: More than three times a week    Frequency of Social Gatherings with Friends and Family: More than three times a week    Attends Religious Services: More than 4 times per year    Active Member of [employer] or Organizations: Yes    Attends Engineer, structural: More than 4 times per year    Marital Status: Married    Tobacco Counseling Counseling given: Not Answered   Clinical Intake:  Pre-visit preparation completed: Yes  Pain : No/denies pain     Diabetes: No            Activities of Daily Living    01/11/2024   11:52 AM  In your present state of health, do you have any difficulty performing the following activities:  Hearing? 0  Vision? 0  Difficulty concentrating or making decisions? 0  Walking or climbing stairs? 0  Dressing or bathing? 0  Doing errands, shopping? 0  Preparing Food and eating ? N  Using the Toilet? N  In the past six months, have you accidently leaked urine? N  Do you have problems with loss of bowel control? N  Managing your Medications? N  Managing your Finances? N    Patient Care Team: Tabori, Katherine E, MD as PCP - Simonne Dubonnet, [facility] as Pharmacist (Pharmacist)  Indicate any recent Medical Services you may have received from other than [facility] providers in the past year (date may be approximate).     Assessment:    This is a routine wellness examination for [place].  Hearing/Vision screen Hearing Screening - Comments:: No trouble  hearing Vision Screening - Comments:: Up to date [facility]   Goals Addressed             This Visit's Progress    Patient Stated   On track    Maintain current level of activity     Patient Stated   Not on track    Meet with a nutritionist get diet better     Patient Stated       Maintain healthy lifestyle     PharmD Care Plan   On track    CARE PLAN ENTRY  Current Barriers:  Chronic Disease Management support, education, and care coordination needs related to:  Essential hypertension  Hyperlipidemia, unspecified hyperlipidemia type  Osteopenia, unspecified location  Obesity (BMI 30-39.9)     Hypertension Pharmacist Clinical Goal(s): Over the next 180 days, patient will work with PharmD and providers to maintain BP goal <130/80 Current regimen:  Hydrochlorothiazide  12.5 mg once daily Interventions: Continue current management Patient self care activities - Over the next 180 days, patient will: Check BP daily, document, and provide at future appointments Consider purchasing home blood pressure monitor with upper arm cuff Ensure daily salt intake < 2300 mg/day  Hyperlipidemia Pharmacist Clinical Goal(s): Over the next 180 days, patient will work with PharmD and providers to maintain LDL goal < 100 Current regimen:  Simvastatin  20 mg once daily  Interventions: Continue current management Patient self care activities - Over the next 180 days, patient will: Continue current medication along with diet/exercise  Weight loss Pharmacist Clinical Goal(s) Over the next 180 days, patient will work with PharmD and providers to maintain healthy body weight. Current regimen:  Daily exercise, including fitness class 2x/day and walking.  Avoidance of chocolate/sweets Portion control  Interventions: None  Patient self care activities - Over the next 180 days, patient  will: Keep up the hard work! Continue to focus on: Daily exercise, including fitness class 2x/day and walking.   Avoidance of chocolate/sweets Portion control  Osteopenia Pharmacist Clinical Goal(s) Over the next 180 days, patient will work with PharmD and providers to minimize risk of major fractures Current regimen:  Vitamin d  2000 units daily Calcium  1200 mg daily  Interventions: Continue current management Patient self care activities - Over the next 180 days, patient will: Continue vitamin d /calcium  supplementation.  Continue weight-bearing and muscle strengthening exercises for building and maintaining bone density.  Medication management Pharmacist Clinical Goal(s): Over the next 180 days, patient will work with PharmD and providers to maintain optimal medication adherence Current pharmacy: [facility] mail order Interventions Comprehensive medication review performed. Continue current medication management strategy Patient self care activities - Over the next 180 days, patient will: Focus on medication adherence by continuing current management Take medications as prescribed Report any questions or concerns to PharmD and/or provider(s) Initial goal documentation.       Depression Screen    01/11/2024   11:55 AM 01/04/2024   11:05 AM 07/06/2023   11:00 AM 01/27/2023    1:02 PM 12/17/2022    1:41 PM 06/22/2022   10:58 AM 12/16/2021   11:01 AM  PHQ 2/9 Scores  PHQ - 2 Score 0 0 0 0 0 0 0  PHQ- 9 Score 0 0 0 0 0 0 0    Fall Risk    01/11/2024   11:51 AM 12/28/2023    1:56 PM 07/06/2023   11:00 AM 01/27/2023    1:02 PM 12/13/2022    8:58 AM  Fall Risk   Falls in the past year? 0 0 0 0 0  Number falls in past yr: 0  0 0   Injury with Fall? 0  0 0   Risk for fall due to :   No Fall Risks No Fall Risks   Follow up Falls evaluation completed;Education provided;Falls prevention discussed  Falls evaluation completed Falls evaluation completed     MEDICARE RISK AT HOME: Medicare Risk at Home Any stairs in or around the home?: Yes If so, are there any without handrails?: No Home  free of loose throw rugs in walkways, pet beds, electrical cords, etc?: Yes Adequate lighting in your home to reduce risk of falls?: Yes Life alert?: No Use of a cane, walker or w/c?: No Grab bars in the bathroom?: Yes Shower chair or bench in shower?: Yes Elevated toilet seat or a handicapped toilet?: Yes  TIMED UP AND GO:  Was the test performed?  No    Cognitive Function:        01/11/2024   11:52 AM 12/17/2022    1:37 PM  6CIT Screen  What Year? 0 points 0 points  What month? 0 points 0 points  What time? 0 points 0 points  Count back from 20 0 points 0 points  Months in reverse 0 points 0 points  Repeat phrase 0 points 2 points  Total Score 0 points 2 points    Immunizations Immunization History  Administered Date(s) Administered   Fluad Quad(high Dose 65+) 05/15/2020, 06/18/2021   Influenza Whole 06/30/2007   Influenza,inj,Quad PF,6+ Mos 05/12/2013, 05/14/2014, 06/01/2014, 06/10/2015, 06/17/2016, 05/12/2017, 05/18/2018, 04/04/2019   Influenza,trivalent, recombinat, inj, PF 05/01/2022   Influenza-Unspecified 05/14/2023   PFIZER SARS-COV-2 Pediatric Vaccination 5-9yrs 06/14/2020   PFIZER(Purple Top)SARS-COV-2 Vaccination 08/30/2019, 09/20/2019, 06/14/2020, 12/19/2020   Pfizer Covid-19 Vaccine Bivalent Booster 75yrs & up 08/12/2021, 05/01/2022, 05/14/2023  Pneumococcal Conjugate-13 08/11/2017   Pneumococcal Polysaccharide-23 04/04/2019   Td 09/11/2006   Tdap 12/15/2016   Zoster Recombinant(Shingrix) 05/08/2021, 09/03/2021   Zoster, Live 05/08/2011    TDAP status: Up to date  Flu Vaccine status: Up to date  Pneumococcal vaccine status: Up to date  Covid-19 vaccine status: Information provided on how to obtain vaccines.   Qualifies for Shingles Vaccine? No   Zostavax completed Yes   Shingrix Completed?: Yes  Screening Tests Health Maintenance  Topic Date Due   COVID-19 Vaccine (8 - 2024-25 season) 07/09/2023   DEXA SCAN  12/03/2023   INFLUENZA VACCINE   03/10/2024   Medicare Annual Wellness (AWV)  01/10/2025   MAMMOGRAM  01/24/2025   Colonoscopy  07/13/2026   DTaP/Tdap/Td (3 - Td or Tdap) 12/16/2026   Pneumonia Vaccine 68+ Years old  Completed   Hepatitis C Screening  Completed   Zoster Vaccines- Shingrix  Completed   HPV VACCINES  Aged Out   Meningococcal B Vaccine  Aged Out    Health Maintenance  Health Maintenance Due  Topic Date Due   COVID-19 Vaccine (8 - 2024-25 season) 07/09/2023   DEXA SCAN  12/03/2023    Colorectal cancer screening: Type of screening: Colonoscopy. Completed 2020. Repeat every 7 years  Mammogram scheduled  Bone Density status: Ordered  . Pt provided with contact info and advised to call to schedule appt.  Lung Cancer Screening: (Low Dose CT Chest recommended if Age 13-80 years, 20 pack-year currently smoking OR have quit w/in 15years.) does not qualify.   Lung Cancer Screening Referral:   Additional Screening:  Hepatitis C Screening: does not qualify; Completed 2019  Vision Screening: Recommended annual ophthalmology exams for early detection of glaucoma and other disorders of the eye. Is the patient up to date with their annual eye exam?  Yes  Who is the provider or what is the name of the office in which the patient attends annual eye exams? [facility] If pt is not established with a provider, would they like to be referred to a provider to establish care? No .   Dental Screening: Recommended annual dental exams for proper oral hygiene    Community Resource Referral / Chronic Care Management: CRR required this visit?  No   CCM required this visit?  No     Plan:     I have personally reviewed and noted the following in the patient's chart:   Medical and social history Use of alcohol, tobacco or illicit drugs  Current medications and supplements including opioid prescriptions. Patient is not currently taking opioid prescriptions. Functional ability and status Nutritional status Physical  activity Advanced directives List of other physicians Hospitalizations, surgeries, and ER visits in previous 12 months Vitals Screenings to include cognitive, depression, and falls Referrals and appointments  In addition, I have reviewed and discussed with patient certain preventive protocols, quality metrics, and best practice recommendations. A written personalized care plan for preventive services as well as general preventive health recommendations were provided to patient.     Kieth Pelt, LPN   03/16/5642   After Visit Summary: (MyChart) Due to this being a telephonic visit, the after visit summary with patients personalized plan was offered to patient via MyChart   Nurse Notes:

## 2024-01-11 NOTE — Telephone Encounter (Signed)
 Copied from CRM [id number]. Topic: Appointments - Appointment Info/Confirmation >> Jan 11, 2024 11:27 AM [place] A wrote: Patient called because she had not received a called for AWV that is at 11:30AM

## 2024-01-11 NOTE — Patient Instructions (Signed)
 [name] , Thank you for taking time to come for your Medicare Wellness Visit. I appreciate your ongoing commitment to your health goals. Please review the following plan we discussed and let me know if I can assist you in the future.   Screening recommendations/referrals: Colonoscopy: up to date Mammogram: scheduled Bone Density: ordered Recommended yearly ophthalmology/optometry visit for glaucoma screening and checkup Recommended yearly dental visit for hygiene and checkup  Vaccinations: Influenza vaccine: up to date Pneumococcal vaccine: up to date Tdap vaccine: up to date Shingles vaccine: up to date      Preventive Care 65 Years and Older, Female Preventive care refers to lifestyle choices and visits with your health care provider that can promote health and wellness. What does preventive care include? A yearly physical exam. This is also called an annual well check. Dental exams once or twice a year. Routine eye exams. Ask your health care provider how often you should have your eyes checked. Personal lifestyle choices, including: Daily care of your teeth and gums. Regular physical activity. Eating a healthy diet. Avoiding tobacco and drug use. Limiting alcohol use. Practicing safe sex. Taking low-dose aspirin every day. Taking vitamin and mineral supplements as recommended by your health care provider. What happens during an annual well check? The services and screenings done by your health care provider during your annual well check will depend on your age, overall health, lifestyle risk factors, and family history of disease. Counseling  Your health care provider may ask you questions about your: Alcohol use. Tobacco use. Drug use. Emotional well-being. Home and relationship well-being. Sexual activity. Eating habits. History of falls. Memory and ability to understand (cognition). Work and work Astronomer. Reproductive health. Screening  You may have the  following tests or measurements: Height, weight, and BMI. Blood pressure. Lipid and cholesterol levels. These may be checked every 5 years, or more frequently if you are over 72 years old. Skin check. Lung cancer screening. You may have this screening every year starting at age 21 if you have a 30-pack-year history of smoking and currently smoke or have quit within the past 15 years. Fecal occult blood test (FOBT) of the stool. You may have this test every year starting at age 26. Flexible sigmoidoscopy or colonoscopy. You may have a sigmoidoscopy every 5 years or a colonoscopy every 10 years starting at age 82. Hepatitis C blood test. Hepatitis B blood test. Sexually transmitted disease (STD) testing. Diabetes screening. This is done by checking your blood sugar (glucose) after you have not eaten for a while (fasting). You may have this done every 1-3 years. Bone density scan. This is done to screen for osteoporosis. You may have this done starting at age 55. Mammogram. This may be done every 1-2 years. Talk to your health care provider about how often you should have regular mammograms. Talk with your health care provider about your test results, treatment options, and if necessary, the need for more tests. Vaccines  Your health care provider may recommend certain vaccines, such as: Influenza vaccine. This is recommended every year. Tetanus, diphtheria, and acellular pertussis (Tdap, Td) vaccine. You may need a Td booster every 10 years. Zoster vaccine. You may need this after age 10. Pneumococcal 13-valent conjugate (PCV13) vaccine. One dose is recommended after age 29. Pneumococcal polysaccharide (PPSV23) vaccine. One dose is recommended after age 47. Talk to your health care provider about which screenings and vaccines you need and how often you need them. This information is not intended  to replace advice given to you by your health care provider. Make sure you discuss any questions you  have with your health care provider. Document Released: 08/23/2015 Document Revised: 04/15/2016 Document Reviewed: 05/28/2015 Elsevier Interactive Patient Education  2017 [employer].  Fall Prevention in the Home Falls can cause injuries. They can happen to people of all ages. There are many things you can do to make your home safe and to help prevent falls. What can I do on the outside of my home? Regularly fix the edges of walkways and driveways and fix any cracks. Remove anything that might make you trip as you walk through a door, such as a raised step or threshold. Trim any bushes or trees on the path to your home. Use bright outdoor lighting. Clear any walking paths of anything that might make someone trip, such as rocks or tools. Regularly check to see if handrails are loose or broken. Make sure that both sides of any steps have handrails. Any raised decks and porches should have guardrails on the edges. Have any leaves, snow, or ice cleared regularly. Use sand or salt on walking paths during winter. Clean up any spills in your garage right away. This includes oil or grease spills. What can I do in the bathroom? Use night lights. Install grab bars by the toilet and in the tub and shower. Do not use towel bars as grab bars. Use non-skid mats or decals in the tub or shower. If you need to sit down in the shower, use a plastic, non-slip stool. Keep the floor dry. Clean up any water that spills on the floor as soon as it happens. Remove soap buildup in the tub or shower regularly. Attach bath mats securely with double-sided non-slip rug tape. Do not have throw rugs and other things on the floor that can make you trip. What can I do in the bedroom? Use night lights. Make sure that you have a light by your bed that is easy to reach. Do not use any sheets or blankets that are too big for your bed. They should not hang down onto the floor. Have a firm chair that has side arms. You can  use this for support while you get dressed. Do not have throw rugs and other things on the floor that can make you trip. What can I do in the kitchen? Clean up any spills right away. Avoid walking on wet floors. Keep items that you use a lot in easy-to-reach places. If you need to reach something above you, use a strong step stool that has a grab bar. Keep electrical cords out of the way. Do not use floor polish or wax that makes floors slippery. If you must use wax, use non-skid floor wax. Do not have throw rugs and other things on the floor that can make you trip. What can I do with my stairs? Do not leave any items on the stairs. Make sure that there are handrails on both sides of the stairs and use them. Fix handrails that are broken or loose. Make sure that handrails are as long as the stairways. Check any carpeting to make sure that it is firmly attached to the stairs. Fix any carpet that is loose or worn. Avoid having throw rugs at the top or bottom of the stairs. If you do have throw rugs, attach them to the floor with carpet tape. Make sure that you have a light switch at the top of the stairs and  the bottom of the stairs. If you do not have them, ask someone to add them for you. What else can I do to help prevent falls? Wear shoes that: Do not have high heels. Have rubber bottoms. Are comfortable and fit you well. Are closed at the toe. Do not wear sandals. If you use a stepladder: Make sure that it is fully opened. Do not climb a closed stepladder. Make sure that both sides of the stepladder are locked into place. Ask someone to hold it for you, if possible. Clearly mark and make sure that you can see: Any grab bars or handrails. First and last steps. Where the edge of each step is. Use tools that help you move around (mobility aids) if they are needed. These include: Canes. Walkers. Scooters. Crutches. Turn on the lights when you go into a dark area. Replace any light  bulbs as soon as they burn out. Set up your furniture so you have a clear path. Avoid moving your furniture around. If any of your floors are uneven, fix them. If there are any pets around you, be aware of where they are. Review your medicines with your doctor. Some medicines can make you feel dizzy. This can increase your chance of falling. Ask your doctor what other things that you can do to help prevent falls. This information is not intended to replace advice given to you by your health care provider. Make sure you discuss any questions you have with your health care provider. Document Released: 05/23/2009 Document Revised: 01/02/2016 Document Reviewed: 08/31/2014 Elsevier Interactive Patient Education  2017 [employer].

## 2024-01-12 ENCOUNTER — Ambulatory Visit (INDEPENDENT_AMBULATORY_CARE_PROVIDER_SITE_OTHER): Admitting: Family Medicine

## 2024-01-12 ENCOUNTER — Encounter: Payer: Self-pay | Admitting: Family Medicine

## 2024-01-12 VITALS — Ht 63.0 in | Wt 177.1 lb

## 2024-01-12 DIAGNOSIS — R350 Frequency of micturition: Secondary | ICD-10-CM

## 2024-01-12 LAB — POCT URINALYSIS DIPSTICK
Bilirubin, UA: NEGATIVE
Blood, UA: POSITIVE
Glucose, UA: NEGATIVE
Ketones, UA: NEGATIVE
Nitrite, UA: NEGATIVE
Protein, UA: POSITIVE — AB
Spec Grav, UA: 1.01 (ref 1.010–1.025)
Urobilinogen, UA: NEGATIVE U/dL — AB
pH, UA: 6.5 (ref 5.0–8.0)

## 2024-01-12 MED ORDER — CEPHALEXIN 500 MG PO CAPS
500.0000 mg | ORAL_CAPSULE | Freq: Two times a day (BID) | ORAL | 0 refills | Status: DC
Start: 2024-01-12 — End: 2024-03-13

## 2024-01-12 NOTE — Progress Notes (Signed)
   Subjective:    Patient ID: Alexandra [name], female    DOB: [birth year], 73 y.o.   MRN: [id number]  HPI Urine frequency- sxs started Saturday.  Increased pressure, frequency, urgency.  Some burning w/ urination.  No visible blood.  No change in products.  Has not been as diligent about changing out of exercise clothes.  Had infxn 2 months ago and was tx'd.     Review of Systems For ROS see HPI     Objective:   Physical Exam Vitals reviewed.  Constitutional:      General: She is not in acute distress.    Appearance: Normal appearance. She is well-developed.  Abdominal:     General: There is no distension.     Palpations: Abdomen is soft.     Tenderness: There is no abdominal tenderness (no suprapubic or CVA tenderness).  Skin:    General: Skin is warm and dry.  Neurological:     General: No focal deficit present.     Mental Status: She is alert and oriented to person, place, and time.  Psychiatric:        Mood and Affect: Mood normal.        Behavior: Behavior normal.        Thought Content: Thought content normal.           Assessment & Plan:  Urinary frequency- new.  Pt's sxs and UA consistent w/ infxn.  Start Keflex  500mg  BID.  Pt expressed understanding and is in agreement w/ plan.

## 2024-01-12 NOTE — Patient Instructions (Signed)
 Follow up as needed or as scheduled START the Keflex  twice daily Drink LOTS of fluids Call with any questions or concerns Have a great summer!!!

## 2024-01-14 LAB — URINE CULTURE
MICRO NUMBER:: 16538558
SPECIMEN QUALITY:: ADEQUATE

## 2024-01-16 ENCOUNTER — Ambulatory Visit: Payer: Self-pay | Admitting: Family Medicine

## 2024-01-17 ENCOUNTER — Other Ambulatory Visit (HOSPITAL_COMMUNITY): Payer: Self-pay

## 2024-01-17 MED ORDER — TIRZEPATIDE-WEIGHT MANAGEMENT 2.5 MG/0.5ML ~~LOC~~ SOLN: 2.5000 mg | SUBCUTANEOUS | 1 refills | Status: DC

## 2024-01-17 NOTE — Addendum Note (Signed)
 Addended by: Briley Bumgarner E on: 01/17/2024 04:18 PM   Modules accepted: Orders

## 2024-01-20 ENCOUNTER — Other Ambulatory Visit (HOSPITAL_COMMUNITY): Payer: Self-pay

## 2024-01-20 ENCOUNTER — Telehealth: Payer: Self-pay

## 2024-01-20 NOTE — Telephone Encounter (Signed)
 Please let pt know that her insurance plan excludes weight loss medication.

## 2024-01-20 NOTE — Telephone Encounter (Signed)
 Ozempic/Mounjaro  is approved exclusively as an adjunct to diet and exercise to improve glycemic  control in adults with type 2 diabetes mellitus. A review of patient's medical chart reveals no  documented diagnosis of type 2 diabetes or an A1C indicative of diabetes. Therefore, they do not  currently meet the criteria for prior authorization of this medication. If clinically appropriate, alternative  options such as Saxenda, Zepbound , or Frederik Jansky may be considered for this patient.   I tried Customer service manager for [employer]. Patient has plan benefit exclusion for weight loss medications.

## 2024-01-20 NOTE — Telephone Encounter (Signed)
 Pt asking if there is anything else she can do at this time. Notes she is very frustrated b/c united is insisting united health care has told her they have not received anything and feels this is not an appropriate denial due to multiple co-morbities asking if an appeal can be submitted if no alternative options.

## 2024-01-20 NOTE — Telephone Encounter (Signed)
 Can we please see why the information we're getting from pt's insurance is not matching what they are telling her regarding Zepbound ?

## 2024-01-20 NOTE — Telephone Encounter (Signed)
 FYI about patients plan preferred

## 2024-01-21 ENCOUNTER — Telehealth: Payer: Self-pay

## 2024-01-21 ENCOUNTER — Other Ambulatory Visit (HOSPITAL_COMMUNITY): Payer: Self-pay

## 2024-01-21 NOTE — Telephone Encounter (Unsigned)
 Copied from CRM [id number]. Topic: General - Other >> Jan 21, 2024 12:53 PM Howard Macho wrote: Reason for CRM: emerson from united healthcare called stating they did not receive a prior authorization for mounjaro  but they did receive a prior authorization for zepbound  and it was denied. Axel Lent stated if the doctor want to submit an appeal for the medication they can. Axel Lent also stated the the member received a call from her doctor stating that the medication was denied but united healthcare did not received prior authorization request. Axel Lent stated there is some miscommunication  CB [phone]

## 2024-01-21 NOTE — Telephone Encounter (Signed)
 Pharmacy Patient Advocate Encounter   Received notification from Pt Calls Messages that prior authorization for Zepbound  2.5MG /0.5ML pen-injectors is required/requested.   Insurance verification completed.   The patient is insured through [facility] .   Per test claim: PA required; PA submitted to above mentioned insurance via CoverMyMeds Key/confirmation #/EOC [id number] Status is pending

## 2024-01-21 NOTE — Telephone Encounter (Signed)
 Pharmacy Patient Advocate Encounter  Received notification from [employer] that Prior Authorization for Zepbound  2.5MG /0.5ML pen-injectors has been DENIED.  Full denial letter will be uploaded to the media tab. See denial reason below.   PA #/Case ID/Reference #: [id number]

## 2024-01-24 NOTE — Telephone Encounter (Signed)
 Spoke with pt explained several times to pt insurance is not covering her medications and without weight loss coverage and Diabetes dx we can do anymore on our part. I asked pt ot call and have insurance to call back with valid number and ext and I would be willing to discuss these concerns. Pt expressed she was ver displeased with the way things have been handled. I asked her if she could explain what else she would have liked things done and she states she does not know . Dr.Tabori was informed of these concerns

## 2024-01-24 NOTE — Telephone Encounter (Signed)
 CB# [phone]  is not valid tried 2x

## 2024-01-24 NOTE — Telephone Encounter (Signed)
 Please see telephone note as well.

## 2024-01-24 NOTE — Telephone Encounter (Signed)
 Pt has been notified.

## 2024-01-24 NOTE — Telephone Encounter (Signed)
 This has been discussed in 2 telephone notes 01/24/2024 Please review. No further action on our side

## 2024-01-24 NOTE — Telephone Encounter (Signed)
 fyi

## 2024-01-24 NOTE — Telephone Encounter (Signed)
 Her PA for Mounjaro  was already rejected bc they require a dx of diabetes (not pre-diabetes).  Waiting on PA for Zepbound  (weight loss version)

## 2024-01-24 NOTE — Telephone Encounter (Signed)
 Pt called back and noted if this is not covered then she will wait and see how things go from here, notes she does not have a phone number for them at this time she has just called the PA line for information.

## 2024-01-24 NOTE — Telephone Encounter (Signed)
 If we have a # for this [facility] rep, can we please call to clarify a few things??  Prior Auth team states that Mounjaro  was DENIED b/c she does not have a dx of diabetes.  We then submitted a prior authorization on Zepbound  (weight loss version) and were told she doesn't have weight loss benefit coverage  Whoever the pt is speaking to at [employer] keeps telling her it is 'covered'.  1) which medication is covered? 2) what is it covered for?  This is a lot of back and forth and there is a lot of frustration but I cannot magically get this approved as thus far EVERYTHING has been denied.  And yet insurance keeps telling her 'it's covered'

## 2024-01-25 ENCOUNTER — Other Ambulatory Visit: Payer: Self-pay | Admitting: Family Medicine

## 2024-01-25 NOTE — Telephone Encounter (Signed)
 Patient is calling in regarding her medication she stated she just seen dr Paulla Bossier and her prescription was denied she is needing a refill and a call back

## 2024-01-26 ENCOUNTER — Ambulatory Visit

## 2024-01-26 ENCOUNTER — Telehealth: Payer: Self-pay | Admitting: Family Medicine

## 2024-01-26 NOTE — Telephone Encounter (Unsigned)
 Copied from CRM [id number]. Topic: Clinical - Prescription Issue >> Jan 26, 2024 11:12 AM Alexandra [name] wrote: Reason for CRM: Pt advise she is still having discomfort of UTI , do not understand why refill request was denied- pt would like callback from nurse [phone]

## 2024-01-28 ENCOUNTER — Other Ambulatory Visit: Payer: Self-pay | Admitting: Family Medicine

## 2024-01-28 DIAGNOSIS — E785 Hyperlipidemia, unspecified: Secondary | ICD-10-CM

## 2024-01-31 ENCOUNTER — Other Ambulatory Visit: Payer: Self-pay | Admitting: Family Medicine

## 2024-01-31 DIAGNOSIS — E785 Hyperlipidemia, unspecified: Secondary | ICD-10-CM

## 2024-01-31 NOTE — Telephone Encounter (Unsigned)
 Copied from CRM [id number]. Topic: Clinical - Medication Refill >> Jan 31, 2024 10:58 AM Turkey A wrote: Medication: atorvastatin  (LIPITOR) 40 MG tablet  Has the patient contacted their pharmacy? Yes (Agent: If no, request that the patient contact the pharmacy for the refill. If patient does not wish to contact the pharmacy document the reason why and proceed with request.) (Agent: If yes, when and what did the pharmacy advise?) Contact Primary  This is the patient's preferred pharmacy:  [facility] [address] [place], [place] - [address] [place] [address] Phone: [phone] Fax: [phone]   Is this the correct pharmacy for this prescription? Yes If no, delete pharmacy and type the correct one.   Has the prescription been filled recently? No  Is the patient out of the medication? No  Has the patient been seen for an appointment in the last year OR does the patient have an upcoming appointment? Yes  Can we respond through MyChart? Yes  Agent: Please be advised that Rx refills may take up to 3 business days. We ask that you follow-up with your pharmacy.

## 2024-02-01 ENCOUNTER — Ambulatory Visit
Admission: RE | Admit: 2024-02-01 | Discharge: 2024-02-01 | Disposition: A | Discharge status: Home / Self Care | Source: Ambulatory Visit | Attending: Family Medicine | Admitting: Family Medicine

## 2024-02-01 DIAGNOSIS — Z1231 Encounter for screening mammogram for malignant neoplasm of breast: Secondary | ICD-10-CM

## 2024-02-08 ENCOUNTER — Other Ambulatory Visit: Payer: Self-pay | Admitting: Family Medicine

## 2024-02-29 ENCOUNTER — Other Ambulatory Visit (HOSPITAL_COMMUNITY): Payer: Self-pay

## 2024-03-08 ENCOUNTER — Telehealth: Payer: Self-pay | Admitting: *Deleted

## 2024-03-08 NOTE — Telephone Encounter (Signed)
 Copied from CRM [id number]. Topic: Clinical - Order For Equipment >> Feb 29, 2024  3:04 PM Alexandra [name] wrote: Reason for CRM: Pt is frustrated as she stated she has made several calls to [facility] and the clinic to obtain more CPAP supplies. Pt stated [facility] told her they faxed over a form to the clinic for NP Tammy Parrett to fill out, but they never received it from the provider. Pt is in need of this to be handled with haste as she stated she needs more supplies for her CPAP. The previous encounter was from 7/16 and a note showing another call from 7/18. Pt requesting new order that is signed be sent to [facility] with it being signed by NP Tammy Parrett.   FX# [phone] CB# [phone]  Pt requesting call back once her order has been sent CB# [phone] ok to leave a vm. >> Feb 29, 2024  3:37 PM Alexandra [name] wrote: Patient needs new order to be signed by Tammy.  Order was sent on 12/28/2023.  Order was also sent on 09/08/23 for a year.  PCCs, please follow up on this as the orders have already been sent.

## 2024-03-09 ENCOUNTER — Ambulatory Visit (HOSPITAL_BASED_OUTPATIENT_CLINIC_OR_DEPARTMENT_OTHER)
Admission: RE | Admit: 2024-03-09 | Discharge: 2024-03-09 | Disposition: A | Discharge status: Home / Self Care | Source: Ambulatory Visit | Attending: Family Medicine | Admitting: Family Medicine

## 2024-03-09 ENCOUNTER — Other Ambulatory Visit: Payer: Self-pay | Admitting: Family Medicine

## 2024-03-09 DIAGNOSIS — Z13820 Encounter for screening for osteoporosis: Secondary | ICD-10-CM | POA: Diagnosis not present

## 2024-03-09 DIAGNOSIS — M8589 Other specified disorders of bone density and structure, multiple sites: Secondary | ICD-10-CM | POA: Insufficient documentation

## 2024-03-09 DIAGNOSIS — M8580 Other specified disorders of bone density and structure, unspecified site: Secondary | ICD-10-CM | POA: Diagnosis present

## 2024-03-09 DIAGNOSIS — E785 Hyperlipidemia, unspecified: Secondary | ICD-10-CM

## 2024-03-09 DIAGNOSIS — M85851 Other specified disorders of bone density and structure, right thigh: Secondary | ICD-10-CM | POA: Diagnosis not present

## 2024-03-09 DIAGNOSIS — Z780 Asymptomatic menopausal state: Secondary | ICD-10-CM | POA: Diagnosis not present

## 2024-03-10 NOTE — Telephone Encounter (Signed)
 Per Arvella at [employer]  Yes,  Received supplies 03-08-2024 NFN

## 2024-03-10 NOTE — Telephone Encounter (Signed)
 I have sent Alexandra [name] a message in regards to that

## 2024-03-13 ENCOUNTER — Telehealth: Payer: Self-pay | Admitting: *Deleted

## 2024-03-13 ENCOUNTER — Ambulatory Visit (INDEPENDENT_AMBULATORY_CARE_PROVIDER_SITE_OTHER): Admitting: Family Medicine

## 2024-03-13 ENCOUNTER — Encounter: Payer: Self-pay | Admitting: Family Medicine

## 2024-03-13 VITALS — BP 132/64 | HR 66 | Ht 63.0 in | Wt 175.2 lb

## 2024-03-13 DIAGNOSIS — R7989 Other specified abnormal findings of blood chemistry: Secondary | ICD-10-CM

## 2024-03-13 DIAGNOSIS — E669 Obesity, unspecified: Secondary | ICD-10-CM | POA: Diagnosis not present

## 2024-03-13 LAB — CBC WITH DIFFERENTIAL/PLATELET
Basophils Absolute: 0 K/uL (ref 0.0–0.1)
Basophils Relative: 0.7 % (ref 0.0–3.0)
Eosinophils Absolute: 0.2 K/uL (ref 0.0–0.7)
Eosinophils Relative: 3.2 % (ref 0.0–5.0)
HCT: 44 % (ref 36.0–46.0)
Hemoglobin: 14.5 g/dL (ref 12.0–15.0)
Lymphocytes Relative: 28 % (ref 12.0–46.0)
Lymphs Abs: 1.6 K/uL (ref 0.7–4.0)
MCHC: 32.9 g/dL (ref 30.0–36.0)
MCV: 87.6 fl (ref 78.0–100.0)
Monocytes Absolute: 0.5 K/uL (ref 0.1–1.0)
Monocytes Relative: 8.3 % (ref 3.0–12.0)
Neutro Abs: 3.4 K/uL (ref 1.4–7.7)
Neutrophils Relative %: 59.8 % (ref 43.0–77.0)
Platelets: 227 K/uL (ref 150.0–400.0)
RBC: 5.02 Mil/uL (ref 3.87–5.11)
RDW: 16.7 % — ABNORMAL HIGH (ref 11.5–15.5)
WBC: 5.6 K/uL (ref 4.0–10.5)

## 2024-03-13 NOTE — Telephone Encounter (Signed)
 Patient returned call and stated that she got what needed, she had a lot of trouble with [facility], they sent her the wrong supplies after telling her that they did not have the paperwork.  Advised her that she can cal her insurance company and see if she can still use [facility] as I had another patient that told me he called and was told he could continue to use them.  Nothing further needed.

## 2024-03-13 NOTE — Telephone Encounter (Signed)
 Copied from CRM [id number]. Topic: Clinical - Order For Equipment >> Feb 23, 2024  3:53 PM Russell PARAS wrote: Reason for CRM:   Pt was contacting clinic due to issues with order for CPAP equipment. [facility] has informed her that the order received was not signed and cannot be processed.   Pt requesting new order that is signed be sent to [facility]  FX# [phone]  CB# [phone]  Pt requesting call back once her order has been sent  CB# [phone] >> Feb 25, 2024  1:07 PM Joesph PARAS wrote: [name] is calling to request that we fax this ASAP, as she needs her supplies ASAP.   Fax to [phone]  ATC patient x1.  Left detailed VM per DPR.  Sent [employer].

## 2024-03-13 NOTE — Progress Notes (Signed)
   Subjective:    Patient ID: Alexandra [name], female    DOB: [birth year], 73 y.o.   MRN: [id number]  HPI Obesity- ongoing issue.  Unfortunately her insurance won't pay for GLP1 medications despite pre-diabetes, hyperlipidemia, mild OSA.    Elevated RDW- at last visit was 16.2.  would like this repeated today.   Review of Systems For ROS see HPI     Objective:   Physical Exam Vitals reviewed.  Constitutional:      General: She is not in acute distress.    Appearance: Normal appearance. She is not ill-appearing.  HENT:     Head: Normocephalic and atraumatic.  Skin:    General: Skin is warm and dry.  Neurological:     General: No focal deficit present.     Mental Status: She is alert and oriented to person, place, and time.  Psychiatric:        Mood and Affect: Mood normal.        Behavior: Behavior normal.        Thought Content: Thought content normal.           Assessment & Plan:  Abnormal CBC- in May pt had mildly elevated RDW.  She would like to have this repeated today as this worries her.

## 2024-03-13 NOTE — Assessment & Plan Note (Signed)
 Pt is very frustrated that insurance would not approve the Zepbound  despite having pre-diabetes, obesity, HTN, hyperlipidemia, and mild sleep apnea.  I told her that we completed multiple prior authorizations and also did an appeal.  Unfortunately, insurance gets to decide what is covered and otherwise, all we can do is pay out of pocket- which in this case is very expensive.  Discussed that [employer] [facility] and [facility] are both using GLP1s and have contracts w/ compounding pharmacies but I am not sure of the price.  She will consider checking into this.

## 2024-03-13 NOTE — Patient Instructions (Signed)
 Schedule your complete physical in 4 months We'll notify you of your lab results and make any changes if needed Consider [facility] or [facility] as both are using GLP1 medications Keep up the good work on healthy diet and regular exercise- you look fantastic! Call with any questions or concerns Stay Safe!  Stay Healthy! Enjoy the rest of your summer!!!

## 2024-03-14 ENCOUNTER — Ambulatory Visit: Payer: Self-pay | Admitting: Family Medicine

## 2024-03-14 ENCOUNTER — Other Ambulatory Visit: Payer: Self-pay | Admitting: Family Medicine

## 2024-03-14 DIAGNOSIS — R7989 Other specified abnormal findings of blood chemistry: Secondary | ICD-10-CM

## 2024-03-14 NOTE — Telephone Encounter (Signed)
 Lab scheduled for tomorrow at 11am

## 2024-03-14 NOTE — Progress Notes (Signed)
 Labs ordered for upcoming lab visit

## 2024-03-14 NOTE — Telephone Encounter (Signed)
 Patient is wondering if there is any additional test she can take to further isolate any issues?

## 2024-03-14 NOTE — Telephone Encounter (Signed)
 Patient states she takes a multivitamin, super b complex, 1 tab daily of 65mg  iron and 325mg  ferrous sulfate. Patient is wondering if she needs to up her dosage of iron? Should she change to  multivitamin that list iron?

## 2024-03-15 ENCOUNTER — Ambulatory Visit: Payer: Self-pay | Admitting: Family Medicine

## 2024-03-15 ENCOUNTER — Other Ambulatory Visit (INDEPENDENT_AMBULATORY_CARE_PROVIDER_SITE_OTHER)

## 2024-03-15 DIAGNOSIS — R7989 Other specified abnormal findings of blood chemistry: Secondary | ICD-10-CM | POA: Diagnosis not present

## 2024-03-15 LAB — B12 AND FOLATE PANEL
Folate: >23.4 ng/mL (ref >5.9)
Vitamin B-12: 792 pg/mL (ref 211–911)

## 2024-03-16 LAB — IRON,TIBC AND FERRITIN PANEL
%SAT: 19 % (ref 16–45)
Ferritin: 11 ng/mL — ABNORMAL LOW (ref 16–288)
Iron: 65 ug/dL (ref 45–160)
TIBC: 351 ug/dL (ref 250–450)

## 2024-03-16 NOTE — Progress Notes (Signed)
 Pt has reviewed labs via MyChart

## 2024-04-19 NOTE — Progress Notes (Unsigned)
 No chief complaint on file.  History of Present Illness: 73 yo female with history of hyperlipidemia, HTN, CAD and sleep apnea who is here today for follow up. She was seen as a new patient in our office in August 2024 for the evaluation of abnormal coronary calcium  score. CT calcium  score 64.5 in May 2024. She was started on ASA and Lipitor by Dr. Mahlon. She had no symptoms when seen here in 2024.   She is here today for follow up. The patient denies any chest pain, dyspnea, palpitations, lower extremity edema, orthopnea, PND, dizziness, near syncope or syncope.   Primary Care Physician: Mahlon Comer BRAVO, MD   Past Medical History:  Diagnosis Date   CAD (coronary artery disease)    Cataract    REMOVED BOTH - LENS IMPLANTS   Depression    Family history of ovarian cancer    Glaucoma    Hyperlipidemia    Hypertension    OSA (obstructive sleep apnea) 10/05/2017   Osteopenia    Sleep apnea    WEARS CPAP    Past Surgical History:  Procedure Laterality Date   CATARACT EXTRACTION     11/04/17   COLONOSCOPY     DERMOID CYST  EXCISION     ELBOW FRACTURE SURGERY  2004    Current Outpatient Medications  Medication Sig Dispense Refill   acetaZOLAMIDE (DIAMOX) 500 MG capsule Take 500 mg by mouth 2 (two) times daily.     albuterol  (VENTOLIN  HFA) 108 (90 Base) MCG/ACT inhaler Inhale 1-2 puffs into the lungs every 6 (six) hours as needed for wheezing or shortness of breath. 18 g 3   Ascorbic Acid (VITAMIN C) 1000 MG tablet Take 1,000 mg by mouth daily.     aspirin EC 81 MG tablet Take 81 mg by mouth daily. Swallow whole.     atorvastatin  (LIPITOR) 40 MG tablet Take 1 tablet by mouth once daily 90 tablet 0   B Complex-C (SUPER B COMPLEX PO) Take by mouth.     benzonatate  (TESSALON ) 100 MG capsule Take 1 capsule (100 mg total) by mouth 3 (three) times daily as needed for cough. 30 capsule 0   brimonidine (ALPHAGAN) 0.2 % ophthalmic solution   3   Calcium  Carbonate-Vit D-Min  (CALCIUM  1200 PO) Take by mouth.     cetirizine  (ZYRTEC ) 10 MG tablet Take 1 tablet (10 mg total) by mouth daily. 30 tablet 11   dorzolamide-timolol (COSOPT) 22.3-6.8 MG/ML ophthalmic solution   1   Glucosamine-Chondroit-Vit C-Mn (GLUCOSAMINE 1500 COMPLEX) CAPS Take by mouth.     hydrochlorothiazide  (HYDRODIURIL ) 12.5 MG tablet TAKE 1 TABLET BY MOUTH DAILY 100 tablet 2   LUMIGAN 0.01 % SOLN      montelukast  (SINGULAIR ) 10 MG tablet Take 1 tablet (10 mg total) by mouth at bedtime. 30 tablet 5   Multiple Vitamins-Minerals (MULTIVITAMIN ADULT PO) Take by mouth.     omeprazole  (PRILOSEC) 20 MG capsule Take 1 capsule by mouth once daily 30 capsule 0   promethazine -dextromethorphan (PROMETHAZINE -DM) 6.25-15 MG/5ML syrup Take 5 mLs by mouth 3 (three) times daily as needed for cough. 200 mL 0   tretinoin (RETIN-A) 0.05 % cream Apply topically at bedtime.     valACYclovir (VALTREX) 1000 MG tablet SMARTSIG:2 Tablet(s) By Mouth     vitamin E 400 UNIT capsule Take 400 Units by mouth daily.     VITAMIN K PO Take by mouth.     No current facility-administered medications for this visit.  No Known Allergies  Social History   Socioeconomic History   Marital status: Married    Spouse name: Not on file   Number of children: 2   Years of education: Not on file   Highest education level: Associate degree: academic program  Occupational History   Occupation: retired   Occupation: Research scientist (medical)  Tobacco Use   Smoking status: Never   Smokeless tobacco: Never  Vaping Use   Vaping status: Never Used  Substance and Sexual Activity   Alcohol use: Yes    Alcohol/week: 2.0 standard drinks of alcohol    Types: 2 Glasses of wine per week    Comment: 2-3 glasses of wine  per month   Drug use: No   Sexual activity: Not Currently  Other Topics Concern   Not on file  Social History Narrative   Not on file   Social Drivers of Health   Financial Resource Strain: Low Risk  (03/09/2024)    Overall Financial Resource Strain (CARDIA)    Difficulty of Paying Living Expenses: Not hard at all  Food Insecurity: No Food Insecurity (03/09/2024)   Hunger Vital Sign    Worried About Running Out of Food in the Last Year: Never true    Ran Out of Food in the Last Year: Never true  Transportation Needs: No Transportation Needs (03/09/2024)   PRAPARE - Administrator, Civil Service (Medical): No    Lack of Transportation (Non-Medical): No  Physical Activity: Sufficiently Active (03/09/2024)   Exercise Vital Sign    Days of Exercise per Week: 7 days    Minutes of Exercise per Session: 60 min  Stress: No Stress Concern Present (03/09/2024)   [employer] of Occupational Health - Occupational Stress Questionnaire    Feeling of Stress: Only a little  Social Connections: Socially Integrated (03/09/2024)   Social Connection and Isolation Panel    Frequency of Communication with Friends and Family: More than three times a week    Frequency of Social Gatherings with Friends and Family: Three times a week    Attends Religious Services: More than 4 times per year    Active Member of Clubs or Organizations: Yes    Attends Banker Meetings: More than 4 times per year    Marital Status: Married  Catering manager Violence: Not At Risk (01/11/2024)   Humiliation, Afraid, Rape, and Kick questionnaire    Fear of Current or Ex-Partner: No    Emotionally Abused: No    Physically Abused: No    Sexually Abused: No    Family History  Problem Relation Age of Onset   Heart attack Mother 62   Ovarian cancer Mother 58   Heart attack Father 72   Lung cancer Sister 40       smoker   Esophageal cancer Maternal Uncle    Lung cancer Maternal Grandfather 86 - 69   Throat cancer Brother 20       smoker   Hodgkin's lymphoma Other 16   Colon cancer Neg Hx    Colon polyps Neg Hx    Rectal cancer Neg Hx    Stomach cancer Neg Hx    Breast cancer Neg Hx    BRCA 1/2 Neg Hx      Review of Systems:  As stated in the HPI and otherwise negative.   There were no vitals taken for this visit.  Physical Examination: General: Well developed, well nourished, NAD  HEENT: OP clear, mucus membranes moist  SKIN: warm, dry. No rashes. Neuro: No focal deficits  Musculoskeletal: Muscle strength 5/5 all ext  Psychiatric: Mood and affect normal  Neck: No JVD, no carotid bruits, no thyromegaly, no lymphadenopathy.  Lungs:Clear bilaterally, no wheezes, rhonci, crackles Cardiovascular: Regular rate and rhythm. No murmurs, gallops or rubs. Abdomen:Soft. Bowel sounds present. Non-tender.  Extremities: No lower extremity edema. Pulses are 2 + in the bilateral DP/PT.  EKG:  EKG is *** ordered today. The ekg ordered today demonstrates   Recent Labs: 01/04/2024: ALT 20; BUN 19; Creatinine, Ser 0.74; Magnesium 2.1; Potassium 4.0; Sodium 143; TSH 2.57 03/13/2024: Hemoglobin 14.5; Platelets 227.0   Lipid Panel    Component Value Date/Time   CHOL 147 01/04/2024 1131   TRIG 84.0 01/04/2024 1131   HDL 78.10 01/04/2024 1131   CHOLHDL 2 01/04/2024 1131   VLDL 16.8 01/04/2024 1131   LDLCALC 52 01/04/2024 1131   LDLCALC 40 07/16/2023 1043   LDLDIRECT 108.0 08/11/2017 1033     Wt Readings from Last 3 Encounters:  03/13/24 175 lb 4 oz (79.5 kg)  01/12/24 177 lb 2 oz (80.3 kg)  01/04/24 179 lb (81.2 kg)    Assessment and Plan:   1. CAD without angina: She has evidence of CAD with abnormal coronary calcium  score in 2024. No chest pain. Continue ASA and statin. She is very active and exercises often.    Labs/ tests ordered today include:  No orders of the defined types were placed in this encounter.  Disposition:   F/U with me in 12 months   Signed, Lonni Cash, MD, [facility] 04/19/2024 4:45 PM    [facility] [address] [place], [place], [place]  [address] Phone: [phone]; Fax: [phone]

## 2024-04-20 ENCOUNTER — Encounter: Payer: Self-pay | Admitting: Cardiovascular Disease

## 2024-04-20 ENCOUNTER — Ambulatory Visit: Attending: Cardiovascular Disease | Admitting: Cardiovascular Disease

## 2024-04-20 VITALS — BP 130/80 | HR 63 | Ht 63.0 in | Wt 176.2 lb

## 2024-04-20 DIAGNOSIS — I2510 Atherosclerotic heart disease of native coronary artery without angina pectoris: Secondary | ICD-10-CM | POA: Diagnosis not present

## 2024-04-20 NOTE — Patient Instructions (Signed)

## 2024-04-26 DIAGNOSIS — Z961 Presence of intraocular lens: Secondary | ICD-10-CM | POA: Diagnosis not present

## 2024-04-26 DIAGNOSIS — H401122 Primary open-angle glaucoma, left eye, moderate stage: Secondary | ICD-10-CM | POA: Diagnosis not present

## 2024-04-26 DIAGNOSIS — H401113 Primary open-angle glaucoma, right eye, severe stage: Secondary | ICD-10-CM | POA: Diagnosis not present

## 2024-05-04 ENCOUNTER — Telehealth: Payer: Self-pay | Admitting: *Deleted

## 2024-05-04 NOTE — Telephone Encounter (Signed)
 Received CMN from [facility] regarding CPAP.  Placed in CMN folder for signature.

## 2024-05-08 NOTE — Telephone Encounter (Signed)
 CMN signed and faxed to [facility] [phone].  Received confirmation fax that it was sent successfully.  Copy made and placed in cabinet.  Original sent to scan.  Nothing further needed.

## 2024-05-12 ENCOUNTER — Other Ambulatory Visit: Payer: Self-pay | Admitting: Family Medicine

## 2024-05-12 DIAGNOSIS — E785 Hyperlipidemia, unspecified: Secondary | ICD-10-CM

## 2024-05-15 NOTE — Telephone Encounter (Unsigned)
 Copied from CRM #[id number]. Topic: General - Other >> May 15, 2024  2:29 PM Berneda FALCON wrote: Reason for CRM: Patient would like PCP/Clinical staff to know about the GLP compound she is taking now. She would like a nurse to call her to discuss this information please. 1.5MG  Trizepitide compound  Patient callback is- [phone] (home)

## 2024-05-22 DIAGNOSIS — L570 Actinic keratosis: Secondary | ICD-10-CM | POA: Diagnosis not present

## 2024-07-10 ENCOUNTER — Encounter: Payer: Self-pay | Admitting: Family Medicine

## 2024-07-10 ENCOUNTER — Ambulatory Visit: Admitting: Family Medicine

## 2024-07-10 VITALS — BP 124/70 | HR 78 | Temp 98.0°F | Ht 62.0 in | Wt 165.8 lb

## 2024-07-10 DIAGNOSIS — Z0000 Encounter for general adult medical examination without abnormal findings: Secondary | ICD-10-CM | POA: Diagnosis not present

## 2024-07-10 DIAGNOSIS — R7303 Prediabetes: Secondary | ICD-10-CM

## 2024-07-10 DIAGNOSIS — M8580 Other specified disorders of bone density and structure, unspecified site: Secondary | ICD-10-CM | POA: Diagnosis not present

## 2024-07-10 DIAGNOSIS — R790 Abnormal level of blood mineral: Secondary | ICD-10-CM | POA: Diagnosis not present

## 2024-07-10 LAB — CBC WITH DIFFERENTIAL/PLATELET
Basophils Absolute: 0 K/uL (ref 0.0–0.1)
Basophils Relative: 0.6 % (ref 0.0–3.0)
Eosinophils Absolute: 0.1 K/uL (ref 0.0–0.7)
Eosinophils Relative: 3.1 % (ref 0.0–5.0)
HCT: 46.8 % — ABNORMAL HIGH (ref 36.0–46.0)
Hemoglobin: 15.5 g/dL — ABNORMAL HIGH (ref 12.0–15.0)
Lymphocytes Relative: 33.8 % (ref 12.0–46.0)
Lymphs Abs: 1.7 K/uL (ref 0.7–4.0)
MCHC: 33.2 g/dL (ref 30.0–36.0)
MCV: 90.9 fl (ref 78.0–100.0)
Monocytes Absolute: 0.4 K/uL (ref 0.1–1.0)
Monocytes Relative: 7.6 % (ref 3.0–12.0)
Neutro Abs: 2.7 K/uL (ref 1.4–7.7)
Neutrophils Relative %: 54.9 % (ref 43.0–77.0)
Platelets: 201 K/uL (ref 150.0–400.0)
RBC: 5.14 Mil/uL — ABNORMAL HIGH (ref 3.87–5.11)
RDW: 13.7 % (ref 11.5–15.5)
WBC: 4.9 K/uL (ref 4.0–10.5)

## 2024-07-10 LAB — TSH: TSH: 1.9 u[IU]/mL (ref 0.35–5.50)

## 2024-07-10 LAB — VITAMIN D 25 HYDROXY (VIT D DEFICIENCY, FRACTURES): VITD: 70.74 ng/mL (ref 30.00–100.00)

## 2024-07-10 LAB — HEMOGLOBIN A1C: Hgb A1c MFr Bld: 5.6 % (ref 4.6–6.5)

## 2024-07-10 NOTE — Patient Instructions (Signed)
 Follow up in 6 months to recheck BP and cholesterol We'll notify you of your lab results and make any changes if needed Continue to work on healthy diet and regular exercise- you look AMAZING!!! Call with any questions or concerns Stay Safe!  Stay Healthy! Happy Holidays!!!

## 2024-07-10 NOTE — Assessment & Plan Note (Signed)
Pt's PE WNL.  UTD on mammo, colonoscopy, DEXA, and immunizations.  Check labs.  Anticipatory guidance provided.

## 2024-07-10 NOTE — Progress Notes (Signed)
   Subjective:    Patient ID: Alexandra [name], female    DOB: 04/27/1951, 73 y.o.   MRN: [id number]  HPI CPE- UTD on mammo, DEXA, colonoscopy, Tdap, PNA, flu.  Patient Care Team    Relationship Specialty Notifications Start End  Mahlon Comer BRAVO, MD PCP - General   07/23/10   Verlin Lonni BIRCH, MD PCP - Cardiology Cardiology  04/20/24   Pandora Cadet, [facility] Pharmacist Pharmacist  11/22/19    Comment: PHONE NUMBER [phone]    Health Maintenance  Topic Date Due   COVID-19 Vaccine (9 - Pfizer risk 2025-26 season) 11/13/2024   Medicare Annual Wellness (AWV)  01/10/2025   Mammogram  01/31/2026   Bone Density Scan  03/09/2026   Colonoscopy  07/13/2026   DTaP/Tdap/Td (3 - Td or Tdap) 12/16/2026   Pneumococcal Vaccine: 50+ Years  Completed   Influenza Vaccine  Completed   Hepatitis C Screening  Completed   Zoster Vaccines- Shingrix  Completed   Meningococcal B Vaccine  Aged Out      Review of Systems Patient reports no vision/ hearing changes, adenopathy,fever,  persistant/recurrent hoarseness , swallowing issues, chest pain, palpitations, edema, persistant/recurrent cough, hemoptysis, dyspnea (rest/exertional/paroxysmal nocturnal), gastrointestinal bleeding (melena, rectal bleeding), abdominal pain, significant heartburn, bowel changes, GU symptoms (dysuria, hematuria, incontinence), Gyn symptoms (abnormal  bleeding, pain),  syncope, focal weakness, memory loss, numbness & tingling, skin/hair/nail changes, abnormal bruising or bleeding, anxiety, or depression.   + 11 lb weight loss- currently on Tirzepatide  compound    Objective:   Physical Exam General Appearance:    Alert, cooperative, no distress, appears stated age  Head:    Normocephalic, without obvious abnormality, atraumatic  Eyes:    PERRL, conjunctiva/corneas clear, EOM's intact both eyes  Ears:    Normal TM's and external ear canals, both ears  Nose:   Nares normal, septum midline, mucosa normal, no drainage    or  sinus tenderness  Throat:   Lips, mucosa, and tongue normal; teeth and gums normal  Neck:   Supple, symmetrical, trachea midline, no adenopathy;    Thyroid : no enlargement/tenderness/nodules  Back:     Symmetric, no curvature, ROM normal, no CVA tenderness  Lungs:     Clear to auscultation bilaterally, respirations unlabored  Chest Wall:    No tenderness or deformity   Heart:    Regular rate and rhythm, S1 and S2 normal, no murmur, rub   or gallop  Breast Exam:    Deferred to mammo  Abdomen:     Soft, non-tender, bowel sounds active all four quadrants,    no masses, no organomegaly  Genitalia:    Deferred   Rectal:    Extremities:   Extremities normal, atraumatic, no cyanosis or edema  Pulses:   2+ and symmetric all extremities  Skin:   Skin color, texture, turgor normal, no rashes or lesions  Lymph nodes:   Cervical, supraclavicular, and axillary nodes normal  Neurologic:   CNII-XII intact, normal strength, sensation and reflexes    throughout          Assessment & Plan:

## 2024-07-11 LAB — BASIC METABOLIC PANEL WITH GFR
BUN: 21 mg/dL (ref 6–23)
CO2: 26 meq/L (ref 19–32)
Calcium: 9.4 mg/dL (ref 8.4–10.5)
Chloride: 108 meq/L (ref 96–112)
Creatinine, Ser: 0.86 mg/dL (ref 0.40–1.20)
GFR: 67.7 mL/min (ref >60.00)
Glucose, Bld: 87 mg/dL (ref 70–99)
Potassium: 3.6 meq/L (ref 3.5–5.1)
Sodium: 143 meq/L (ref 135–145)

## 2024-07-11 LAB — LIPID PANEL
Cholesterol: 115 mg/dL (ref 0–200)
HDL: 57.3 mg/dL (ref >39.00)
LDL Cholesterol: 41 mg/dL (ref 0–99)
NonHDL: 58.04
Total CHOL/HDL Ratio: 2
Triglycerides: 84 mg/dL (ref 0.0–149.0)
VLDL: 16.8 mg/dL (ref 0.0–40.0)

## 2024-07-11 LAB — HEPATIC FUNCTION PANEL
ALT: 22 U/L (ref 0–35)
AST: 23 U/L (ref 0–37)
Albumin: 4.4 g/dL (ref 3.5–5.2)
Alkaline Phosphatase: 39 U/L (ref 39–117)
Bilirubin, Direct: 0.3 mg/dL (ref 0.0–0.3)
Total Bilirubin: 2.2 mg/dL — ABNORMAL HIGH (ref 0.2–1.2)
Total Protein: 6.5 g/dL (ref 6.0–8.3)

## 2024-07-11 LAB — IRON,TIBC AND FERRITIN PANEL
%SAT: 38 % (ref 16–45)
Ferritin: 37 ng/mL (ref 16–288)
Iron: 121 ug/dL (ref 45–160)
TIBC: 316 ug/dL (ref 250–450)

## 2024-07-12 ENCOUNTER — Ambulatory Visit: Payer: Self-pay | Admitting: Family Medicine

## 2024-07-12 ENCOUNTER — Other Ambulatory Visit: Payer: Self-pay

## 2024-07-12 DIAGNOSIS — R17 Unspecified jaundice: Secondary | ICD-10-CM

## 2024-07-12 DIAGNOSIS — D582 Other hemoglobinopathies: Secondary | ICD-10-CM

## 2024-07-12 NOTE — Telephone Encounter (Signed)
 Patient has additional questions. If she is able to get her labs drawn at [facility], she would need to call them and schedule. I do not have access to their schedule (can't seem to find it when I have tried in the past). Future labs ordered.

## 2024-07-13 NOTE — Telephone Encounter (Signed)
 Patient has been scheduled at [facility] for 12/11

## 2024-07-20 ENCOUNTER — Other Ambulatory Visit

## 2024-07-24 ENCOUNTER — Other Ambulatory Visit (INDEPENDENT_AMBULATORY_CARE_PROVIDER_SITE_OTHER)

## 2024-07-24 DIAGNOSIS — R17 Unspecified jaundice: Secondary | ICD-10-CM

## 2024-07-24 DIAGNOSIS — D582 Other hemoglobinopathies: Secondary | ICD-10-CM | POA: Diagnosis not present

## 2024-07-25 ENCOUNTER — Ambulatory Visit: Payer: Self-pay | Admitting: Family Medicine

## 2024-07-25 LAB — HEPATIC FUNCTION PANEL
ALT: 20 U/L (ref 0–35)
AST: 22 U/L (ref 5–37)
Albumin: 4.3 g/dL (ref 3.5–5.2)
Alkaline Phosphatase: 48 U/L (ref 39–117)
Bilirubin, Direct: 0.2 mg/dL (ref 0.0–0.3)
Total Bilirubin: 1.2 mg/dL (ref 0.2–1.2)
Total Protein: 6.5 g/dL (ref 6.0–8.3)

## 2024-07-25 LAB — CBC WITH DIFFERENTIAL/PLATELET
Basophils Absolute: 0 K/uL (ref 0.0–0.1)
Basophils Relative: 0.6 % (ref 0.0–3.0)
Eosinophils Absolute: 0.2 K/uL (ref 0.0–0.7)
Eosinophils Relative: 3.1 % (ref 0.0–5.0)
HCT: 44.4 % (ref 36.0–46.0)
Hemoglobin: 15.1 g/dL — ABNORMAL HIGH (ref 12.0–15.0)
Lymphocytes Relative: 26.1 % (ref 12.0–46.0)
Lymphs Abs: 1.6 K/uL (ref 0.7–4.0)
MCHC: 34 g/dL (ref 30.0–36.0)
MCV: 91.1 fl (ref 78.0–100.0)
Monocytes Absolute: 0.7 K/uL (ref 0.1–1.0)
Monocytes Relative: 10.9 % (ref 3.0–12.0)
Neutro Abs: 3.7 K/uL (ref 1.4–7.7)
Neutrophils Relative %: 59.3 % (ref 43.0–77.0)
Platelets: 224 K/uL (ref 150.0–400.0)
RBC: 4.87 Mil/uL (ref 3.87–5.11)
RDW: 13.5 % (ref 11.5–15.5)
WBC: 6.3 K/uL (ref 4.0–10.5)

## 2024-08-09 ENCOUNTER — Other Ambulatory Visit: Payer: Self-pay | Admitting: Family Medicine

## 2024-08-09 DIAGNOSIS — E785 Hyperlipidemia, unspecified: Secondary | ICD-10-CM

## 2024-09-05 ENCOUNTER — Encounter: Payer: Self-pay | Admitting: Cardiovascular Disease

## 2025-01-08 ENCOUNTER — Ambulatory Visit: Admitting: Family Medicine

## 2025-01-16 ENCOUNTER — Encounter
